# Patient Record
Sex: Male | Born: 1955 | Race: Black or African American | Hispanic: No | Smoking: Current some day smoker
Health system: Southern US, Community
[De-identification: ages and names within clinical notes are randomized; demographics above are authoritative.]

## PROBLEM LIST (undated history)

## (undated) DIAGNOSIS — K6289 Other specified diseases of anus and rectum: Secondary | ICD-10-CM

## (undated) DIAGNOSIS — C801 Malignant (primary) neoplasm, unspecified: Secondary | ICD-10-CM

## (undated) DIAGNOSIS — R195 Other fecal abnormalities: Secondary | ICD-10-CM

## (undated) DIAGNOSIS — E559 Vitamin D deficiency, unspecified: Secondary | ICD-10-CM

## (undated) DIAGNOSIS — K529 Noninfective gastroenteritis and colitis, unspecified: Secondary | ICD-10-CM

## (undated) DIAGNOSIS — J45909 Unspecified asthma, uncomplicated: Secondary | ICD-10-CM

## (undated) DIAGNOSIS — M545 Low back pain, unspecified: Secondary | ICD-10-CM

## (undated) DIAGNOSIS — C7951 Secondary malignant neoplasm of bone: Secondary | ICD-10-CM

## (undated) DIAGNOSIS — G8929 Other chronic pain: Secondary | ICD-10-CM

## (undated) DIAGNOSIS — C61 Malignant neoplasm of prostate: Secondary | ICD-10-CM

## (undated) DIAGNOSIS — I10 Essential (primary) hypertension: Secondary | ICD-10-CM

## (undated) HISTORY — DX: Vitamin D deficiency, unspecified: E55.9

## (undated) HISTORY — DX: Malignant neoplasm of prostate: C61

## (undated) HISTORY — DX: Noninfective gastroenteritis and colitis, unspecified: K52.9

## (undated) HISTORY — DX: Other chronic pain: G89.29

## (undated) HISTORY — DX: Secondary malignant neoplasm of bone: C79.51

## (undated) HISTORY — DX: Low back pain, unspecified: M54.50

## (undated) HISTORY — DX: Other fecal abnormalities: R19.5

---
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
## 2017-05-10 ENCOUNTER — Other Ambulatory Visit: Payer: Self-pay | Admitting: Oncology

## 2017-05-10 ENCOUNTER — Encounter (HOSPITAL_COMMUNITY): Payer: Self-pay

## 2017-05-10 ENCOUNTER — Emergency Department (HOSPITAL_COMMUNITY): Payer: Self-pay

## 2017-05-10 ENCOUNTER — Inpatient Hospital Stay (HOSPITAL_COMMUNITY)
Admission: EM | Admit: 2017-05-10 | Discharge: 2017-05-12 | DRG: 825 | Disposition: A | Discharge status: Home / Self Care | Payer: Self-pay | Attending: Internal Medicine | Admitting: Internal Medicine

## 2017-05-10 ENCOUNTER — Other Ambulatory Visit: Payer: Self-pay

## 2017-05-10 DIAGNOSIS — F17210 Nicotine dependence, cigarettes, uncomplicated: Secondary | ICD-10-CM | POA: Diagnosis present

## 2017-05-10 DIAGNOSIS — R1900 Intra-abdominal and pelvic swelling, mass and lump, unspecified site: Secondary | ICD-10-CM | POA: Diagnosis present

## 2017-05-10 DIAGNOSIS — R5383 Other fatigue: Secondary | ICD-10-CM | POA: Diagnosis present

## 2017-05-10 DIAGNOSIS — K5900 Constipation, unspecified: Secondary | ICD-10-CM | POA: Diagnosis present

## 2017-05-10 DIAGNOSIS — R634 Abnormal weight loss: Secondary | ICD-10-CM | POA: Diagnosis present

## 2017-05-10 DIAGNOSIS — C772 Secondary and unspecified malignant neoplasm of intra-abdominal lymph nodes: Principal | ICD-10-CM | POA: Diagnosis present

## 2017-05-10 DIAGNOSIS — R630 Anorexia: Secondary | ICD-10-CM | POA: Diagnosis present

## 2017-05-10 DIAGNOSIS — R319 Hematuria, unspecified: Secondary | ICD-10-CM | POA: Diagnosis present

## 2017-05-10 DIAGNOSIS — R531 Weakness: Secondary | ICD-10-CM | POA: Diagnosis present

## 2017-05-10 DIAGNOSIS — C61 Malignant neoplasm of prostate: Secondary | ICD-10-CM | POA: Diagnosis present

## 2017-05-10 HISTORY — DX: Unspecified asthma, uncomplicated: J45.909

## 2017-05-10 LAB — URINALYSIS, ROUTINE W REFLEX MICROSCOPIC
Bilirubin Urine: NEGATIVE
Glucose, UA: NEGATIVE mg/dL
Ketones, ur: NEGATIVE mg/dL
Nitrite: NEGATIVE
PROTEIN: 100 mg/dL — AB
SPECIFIC GRAVITY, URINE: 1.029 (ref 1.005–1.030)
SQUAMOUS EPITHELIAL / LPF: NONE SEEN
pH: 5 (ref 5.0–8.0)

## 2017-05-10 LAB — COMPREHENSIVE METABOLIC PANEL
ALBUMIN: 3.4 g/dL — AB (ref 3.5–5.0)
ALT: 13 U/L — AB (ref 17–63)
AST: 19 U/L (ref 15–41)
Alkaline Phosphatase: 57 U/L (ref 38–126)
Anion gap: 10 (ref 5–15)
BUN: 14 mg/dL (ref 6–20)
CHLORIDE: 107 mmol/L (ref 101–111)
CO2: 20 mmol/L — AB (ref 22–32)
CREATININE: 0.85 mg/dL (ref 0.61–1.24)
Calcium: 9.1 mg/dL (ref 8.9–10.3)
GFR calc Af Amer: >60 mL/min (ref >60)
GFR calc non Af Amer: >60 mL/min (ref >60)
Glucose, Bld: 105 mg/dL — ABNORMAL HIGH (ref 65–99)
Potassium: 4.1 mmol/L (ref 3.5–5.1)
SODIUM: 137 mmol/L (ref 135–145)
Total Bilirubin: 1 mg/dL (ref 0.3–1.2)
Total Protein: 6.9 g/dL (ref 6.5–8.1)

## 2017-05-10 LAB — CBC
HCT: 41.9 % (ref 39.0–52.0)
Hemoglobin: 13.8 g/dL (ref 13.0–17.0)
MCH: 31.5 pg (ref 26.0–34.0)
MCHC: 32.9 g/dL (ref 30.0–36.0)
MCV: 95.7 fL (ref 78.0–100.0)
PLATELETS: 369 10*3/uL (ref 150–400)
RBC: 4.38 MIL/uL (ref 4.22–5.81)
RDW: 13 % (ref 11.5–15.5)
WBC: 5.8 10*3/uL (ref 4.0–10.5)

## 2017-05-10 LAB — LACTATE DEHYDROGENASE: LDH: 225 U/L — AB (ref 98–192)

## 2017-05-10 LAB — PSA: PROSTATIC SPECIFIC ANTIGEN: 293 ng/mL — AB (ref 0.00–4.00)

## 2017-05-10 LAB — LIPASE, BLOOD: LIPASE: 43 U/L (ref 11–51)

## 2017-05-10 MED ORDER — MORPHINE SULFATE (PF) 4 MG/ML IV SOLN
4.0000 mg | Freq: Once | INTRAVENOUS | Status: AC
  Administered 2017-05-10: 4 mg via INTRAVENOUS
  Filled 2017-05-10: qty 1

## 2017-05-10 MED ORDER — MORPHINE SULFATE (PF) 4 MG/ML IV SOLN: 2.0000 mg | INTRAVENOUS | Status: DC | PRN

## 2017-05-10 MED ORDER — IOPAMIDOL (ISOVUE-300) INJECTION 61%
INTRAVENOUS | Status: AC
  Administered 2017-05-10: 100 mL
  Filled 2017-05-10: qty 100

## 2017-05-10 MED ORDER — SODIUM CHLORIDE 0.9% FLUSH: 3.0000 mL | INTRAVENOUS | Status: DC | PRN

## 2017-05-10 MED ORDER — IOPAMIDOL (ISOVUE-300) INJECTION 61%: 100.0000 mL | Freq: Once | INTRAVENOUS | Status: DC | PRN

## 2017-05-10 MED ORDER — SODIUM CHLORIDE 0.9 % IV SOLN: 250.0000 mL | INTRAVENOUS | Status: DC | PRN

## 2017-05-10 MED ORDER — DOCUSATE SODIUM 100 MG PO CAPS
100.0000 mg | ORAL_CAPSULE | Freq: Two times a day (BID) | ORAL | Status: DC
  Administered 2017-05-10 – 2017-05-12 (×3): 100 mg via ORAL
  Filled 2017-05-10 (×4): qty 1

## 2017-05-10 MED ORDER — SODIUM CHLORIDE 0.9% FLUSH
3.0000 mL | Freq: Two times a day (BID) | INTRAVENOUS | Status: DC
  Administered 2017-05-10 – 2017-05-11 (×3): 3 mL via INTRAVENOUS

## 2017-05-10 MED ORDER — SODIUM CHLORIDE 0.9 % IV BOLUS
1000.0000 mL | Freq: Once | INTRAVENOUS | Status: AC
  Administered 2017-05-10: 1000 mL via INTRAVENOUS

## 2017-05-10 NOTE — ED Notes (Signed)
Gave patient a UA cup. Patient states that he can not urinate at all.

## 2017-05-10 NOTE — ED Triage Notes (Addendum)
Pt states is has been 3 weeks since he had a "normal sized BM, just been having little spots". C/O poor PO intake, and "lost about 30 pounds in the last 3 weeks" Pt also reports "about 3 days ago I was having blood in my urine with some clots coming out". Pt has been "eating BC's like candy" for pain. Audible bowl sounds.

## 2017-05-10 NOTE — H&P (Signed)
History and Physical    Anthony Villanueva OEU:[id number] DOB: [birth year] DOA: 05/10/2017  PCP: Tresa Garter, MD  Patient coming from: Home  Chief Complaint: Constipation  HPI: Anthony Villanueva is a 62 y.o. male with no past medical history comes in with complaints of constipation for over 3 weeks.  Patient reports he is lost over 30 pounds in the last several months.  He has been extremely fatigued and to the point where he had to quit his job yesterday.  He has had blood in his urine.  Denies fevers.  Denies any nausea or vomiting or diarrhea.  Patient had a CT scan today which showed a very large pelvic mass likely an underlying prostate malignancy.  Patient has no health insurance and no cardiac care physician so was referred for admission for further workup with a biopsy in the morning by IR.  Review of Systems: As per HPI otherwise 10 point review of systems negative.   History reviewed. No pertinent past medical history.  None  History reviewed. No pertinent surgical history.  None   reports that he has been smoking cigarettes.  He has never used smokeless tobacco. He reports that he drinks about 7.2 oz of alcohol per week. He reports that he has current or past drug history. Drug: Marijuana.  No Known Allergies  No family history on file.  No prostate cancer  Prior to Admission medications   Medication Sig Start Date End Date Taking? Authorizing Provider  acetaminophen (TYLENOL) 500 MG tablet Take 1,000 mg by mouth every 6 (six) hours as needed for mild pain.   Yes [provider]  Aspirin-Salicylamide-Caffeine (ARTHRITIS STRENGTH BC POWDER PO) Take 1 Package by mouth as needed (hip pain).   Yes [provider]    Physical Exam: Vitals:   05/10/17 1140 05/10/17 1145 05/10/17 1348  BP: 140/85  (!) 139/96  Pulse: (!) 119  100  Resp: 16  18  Temp: 98.6 F (37 C)    TempSrc: Oral    SpO2: 100%  99%  Weight:  77.1 kg (170 lb)   Height:  6' (1.829 m)        Constitutional: NAD, calm, comfortable Vitals:   05/10/17 1140 05/10/17 1145 05/10/17 1348  BP: 140/85  (!) 139/96  Pulse: (!) 119  100  Resp: 16  18  Temp: 98.6 F (37 C)    TempSrc: Oral    SpO2: 100%  99%  Weight:  77.1 kg (170 lb)   Height:  6' (1.829 m)    Eyes: PERRL, lids and conjunctivae normal ENMT: Mucous membranes are moist. Posterior pharynx clear of any exudate or lesions.Normal dentition.  Neck: normal, supple, no masses, no thyromegaly Respiratory: clear to auscultation bilaterally, no wheezing, no crackles. Normal respiratory effort. No accessory muscle use.  Cardiovascular: Regular rate and rhythm, no murmurs / rubs / gallops. No extremity edema. 2+ pedal pulses. No carotid bruits.  Abdomen: no tenderness, no masses palpated. No hepatosplenomegaly. Bowel sounds positive.  Musculoskeletal: no clubbing / cyanosis. No joint deformity upper and lower extremities. Good ROM, no contractures. Normal muscle tone.  Skin: no rashes, lesions, ulcers. No induration Neurologic: CN 2-12 grossly intact. Sensation intact, DTR normal. Strength 5/5 in all 4.  Psychiatric: Normal judgment and insight. Alert and oriented x 3. Normal mood.    Labs on Admission: I have personally reviewed following labs and imaging studies  CBC: Recent Labs  Lab 05/10/17 1154  WBC 5.8  HGB 13.8  HCT 41.9  MCV 95.7  PLT 818   Basic Metabolic Panel: Recent Labs  Lab 05/10/17 1154  NA 137  K 4.1  CL 107  CO2 20*  GLUCOSE 105*  BUN 14  CREATININE 0.85  CALCIUM 9.1   GFR: Estimated Creatinine Clearance: 102 mL/min (by C-G formula based on SCr of 0.85 mg/dL). Liver Function Tests: Recent Labs  Lab 05/10/17 1154  AST 19  ALT 13*  ALKPHOS 57  BILITOT 1.0  PROT 6.9  ALBUMIN 3.4*   Recent Labs  Lab 05/10/17 1154  LIPASE 43   No results for input(s): AMMONIA in the last 168 hours. Coagulation Profile: No results for input(s): INR, PROTIME in the last 168 hours. Cardiac  Enzymes: No results for input(s): CKTOTAL, CKMB, CKMBINDEX, TROPONINI in the last 168 hours. BNP (last 3 results) No results for input(s): PROBNP in the last 8760 hours. HbA1C: No results for input(s): HGBA1C in the last 72 hours. CBG: No results for input(s): GLUCAP in the last 168 hours. Lipid Profile: No results for input(s): CHOL, HDL, LDLCALC, TRIG, CHOLHDL, LDLDIRECT in the last 72 hours. Thyroid Function Tests: No results for input(s): TSH, T4TOTAL, FREET4, T3FREE, THYROIDAB in the last 72 hours. Anemia Panel: No results for input(s): VITAMINB12, FOLATE, FERRITIN, TIBC, IRON, RETICCTPCT in the last 72 hours. Urine analysis:    Component Value Date/Time   COLORURINE AMBER (A) 05/10/2017 1148   APPEARANCEUR CLOUDY (A) 05/10/2017 1148   LABSPEC 1.029 05/10/2017 1148   PHURINE 5.0 05/10/2017 1148   GLUCOSEU NEGATIVE 05/10/2017 1148   HGBUR LARGE (A) 05/10/2017 1148   BILIRUBINUR NEGATIVE 05/10/2017 1148   KETONESUR NEGATIVE 05/10/2017 1148   PROTEINUR 100 (A) 05/10/2017 1148   NITRITE NEGATIVE 05/10/2017 1148   LEUKOCYTESUR MODERATE (A) 05/10/2017 1148   Sepsis Labs: !!!!!!!!!!!!!!!!!!!!!!!!!!!!!!!!!!!!!!!!!!!! @LABRCNTIP (procalcitonin:4,lacticidven:4) )No results found for this or any previous visit (from the past 240 hour(s)).   Radiological Exams on Admission: Ct Abdomen Pelvis W Contrast  Result Date: 05/10/2017 CLINICAL DATA:  62 year old male with a history of weight loss EXAM: CT ABDOMEN AND PELVIS WITH CONTRAST TECHNIQUE: Multidetector CT imaging of the abdomen and pelvis was performed using the standard protocol following bolus administration of intravenous contrast. CONTRAST:  122mL ISOVUE-300 IOPAMIDOL (ISOVUE-300) INJECTION 61% COMPARISON:  None. FINDINGS: Lower chest: No acute Hepatobiliary: Unremarkable appearance of liver parenchyma. Unremarkable gallbladder Pancreas: Unremarkable pancreas Spleen: Unremarkable spleen Adrenals/Urinary Tract: Unremarkable adrenal  glands. Right kidney with no hydronephrosis or nephrolithiasis. Unremarkable course of the visualized ureter. There is a fluid density cystic structure at the superior right kidney measuring 3.2 cm. Left kidney without hydronephrosis or nephrolithiasis. Unremarkable course of the visualized left ureter. Urinary bladder is uplifted by the pelvic mass which is inseparable from the bladder base. No inflammatory changes of the urinary bladder. Stomach/Bowel: Unremarkable appearance of the stomach. Unremarkable small bowel with no abnormal distention. No transition point. Large stool burden without evidence of transition point. Normal appendix. Mild diverticular change of the sigmoid colon. The rectum is displaced posteriorly by pelvic mass. Vascular/Lymphatic: Heterogeneously enhancing pelvic mass which is centered between urinary bladder and rectum, with the largest diameter on axial images on image 71 measuring 6.6 cm x 9.6 cm. There is no differentiation of prostate gland. The mass demonstrates a cranial caudal dimension on the sagittal images of approximately 10.5 cm. Mass effect displaces the rectum posteriorly and up lives the urinary bladder. Mass is inseparable from the urinary bladder base. Associated pelvic lymphadenopathy of the pelvic sidewall with multiple pathologic lymph nodes of  the pelvis and iliac stations. There is large lymph node mass of the periaortic/preaortic nodal stations at the level of the kidneys, with circumferential involvement of the aorta. The nodal mass surrounds bilateral renal arteries and bilateral renal veins. Multiple lymph nodes of the upper abdominal nodal stations at the level of the celiac artery. Mild atherosclerotic changes. No abdominal aortic aneurysm. No dissection. Bilateral iliac arteries and the proximal profunda femoris and common femoral artery bilaterally are patent. The suprarenal IVC is patent. Below the renal vein inflow, IVC is not appreciated given the extensive  lymphadenopathy. Reproductive: Prostate is not separable from the pelvic mass. Other: Small fat containing umbilical hernia. Musculoskeletal: No acute displaced fracture. Sclerotic focus at the anterior T12 and L2 vertebral body. No bony canal narrowing. Degenerative changes of the spine. Mild degenerative changes of the bilateral hips. There are small sclerotic foci within the bilateral iliac bones. IMPRESSION: Pelvic mass measuring 10 cm centered in the rectovesical space, inseparable from the adjacent anatomy. The etiology is favored to be prostate carcinoma, however, colon/rectal cancer or lymphoma are also considered. Referral for oncologic evaluation recommended, as well as correlation with lab values. Pelvic and retroperitoneal lymphadenopathy, including extensive periaortic nodal mass at the level of the kidneys surrounding the vasculature. No evidence of bowel obstruction.  Moderate stool burden. Diverticular change without evidence of acute diverticulitis. Small sclerotic focus of the anterior T12 and L2 vertebral bodies as well as small sclerotic foci the bilateral iliac bones. These are nonspecific, though could potentially represent bony metastases in the setting of prostate carcinoma. These results were called by telephone at the time of interpretation on 05/10/2017 at 5:34 pm to Dr. Jeannett Senior , who verbally acknowledged these results. Electronically Signed   By: Corrie Mckusick D.O.   On: 05/10/2017 17:34    Old chart reviewed Case discussed with EDP  Assessment/Plan 62 year old male with weight loss, constipation, hematuria comes in found to have large pelvic mass concerning for malignancy Principal Problem:   Pelvic mass-Keep n.p.o. for IR guided biopsy of mass in the morning.  Will need to get social work and case management involved due to his lack of health insurance.  Dr. Griffith Citron with oncology is arranging for him to get follow-up in their office as an outpatient.  PSA is pending.   Need to get biopsy formal results back to formulate further plan.  Active Problems:   Constipation-abdominal exam is benign patient is not distended or vomiting.   Weight loss, unintentional-likely due to above   Hematuria-noted    DVT prophylaxis: SCDs Code Status: Full Family Communication: None Disposition Plan: Per day team Consults called: Oncology, urology, IR Admission status: Observation   Tahnee Cifuentes A MD [facility]  If 7PM-7AM, please contact night-coverage [email or web address] Password [facility]  05/10/2017, 7:29 PM

## 2017-05-10 NOTE — ED Provider Notes (Signed)
[facility] EMERGENCY DEPARTMENT Provider Note   CSN: [id number] Arrival date & time: 05/10/17  1134     History   Chief Complaint Chief Complaint  Patient presents with  . Constipation  . Hematuria    HPI Anthony Villanueva is a 62 y.o. male.  =    Anthony Villanueva is a 62 y.o. male with no reported medical problems, presents to emergency department complaining of loss of appetite, generalized weakness, weight loss.  Patient states that over the last 3 weeks he has had no appetite.  He also reports that he has not had any stool over the last 3 weeks.  He states when he urinates he has noticed some blood.  Denies any dysuria, no frequency or urgency.  Denies any nausea or vomiting.   states that he lost approximately 30 pounds in the last 3 weeks.  "I just do not have appetite, I have to force myself to eat something."  He reports progressive generalized weakness, and states it is so bad that he could not go to work today.  He is a smoker.  He states he has been smoking marijuana as well to help with his appetite as well as drinking alcohol which is not helping.  History reviewed. No pertinent past medical history.  There are no active problems to display for this patient.   History reviewed. No pertinent surgical history.      Home Medications    Prior to Admission medications   Not on File    Family History No family history on file.  Social History Social History   Tobacco Use  . Smoking status: Current Every Day Smoker    Types: Cigarettes  . Smokeless tobacco: Never Used  . Tobacco comment: 3-4 per day   Substance Use Topics  . Alcohol use: Yes    Alcohol/week: 7.2 oz    Types: 12 Cans of beer per week  . Drug use: Yes    Types: Marijuana     Allergies   Patient has no known allergies.   Review of Systems Review of Systems  Constitutional: Positive for appetite change, fatigue and unexpected weight change. Negative for chills and  fever.  Respiratory: Negative for cough, chest tightness and shortness of breath.   Cardiovascular: Negative for chest pain, palpitations and leg swelling.  Gastrointestinal: Negative for abdominal distention, abdominal pain, diarrhea, nausea and vomiting.  Genitourinary: Positive for hematuria. Negative for dysuria, flank pain, frequency and urgency.  Musculoskeletal: Negative for arthralgias, myalgias, neck pain and neck stiffness.  Skin: Negative for rash.  Allergic/Immunologic: Negative for immunocompromised state.  Neurological: Positive for weakness. Negative for dizziness, light-headedness, numbness and headaches.  All other systems reviewed and are negative.    Physical Exam Updated Vital Signs BP (!) 139/96 (BP Location: Right Arm)   Pulse 100   Temp 98.6 F (37 C) (Oral)   Resp 18   Ht 6' (1.829 m)   Wt 77.1 kg (170 lb)   SpO2 99%   BMI 23.06 kg/m   Physical Exam  Constitutional: He is oriented to person, place, and time. He appears well-developed and well-nourished. No distress.  HENT:  Head: Normocephalic and atraumatic.  Eyes: Conjunctivae are normal.  Neck: Neck supple.  Cardiovascular: Normal rate, regular rhythm and normal heart sounds.  Pulmonary/Chest: Effort normal. No respiratory distress. He has no wheezes. He has no rales.  Abdominal: Soft. Bowel sounds are normal. He exhibits no distension. There is no tenderness. There is no  rebound and no guarding.  Musculoskeletal: He exhibits no edema.  Neurological: He is alert and oriented to person, place, and time.  Skin: Skin is warm and dry.  Nursing note and vitals reviewed.    ED Treatments / Results  Labs (all labs ordered are listed, but only abnormal results are displayed) Labs Reviewed  COMPREHENSIVE METABOLIC PANEL - Abnormal; Notable for the following components:      Result Value   CO2 20 (*)    Glucose, Bld 105 (*)    Albumin 3.4 (*)    ALT 13 (*)    All other components within normal  limits  URINALYSIS, ROUTINE W REFLEX MICROSCOPIC - Abnormal; Notable for the following components:   Color, Urine AMBER (*)    APPearance CLOUDY (*)    Hgb urine dipstick LARGE (*)    Protein, ur 100 (*)    Leukocytes, UA MODERATE (*)    Bacteria, UA RARE (*)    All other components within normal limits  LIPASE, BLOOD  CBC    EKG None  Radiology Ct Abdomen Pelvis W Contrast  Result Date: 05/10/2017 CLINICAL DATA:  62 year old male with a history of weight loss EXAM: CT ABDOMEN AND PELVIS WITH CONTRAST TECHNIQUE: Multidetector CT imaging of the abdomen and pelvis was performed using the standard protocol following bolus administration of intravenous contrast. CONTRAST:  113mL ISOVUE-300 IOPAMIDOL (ISOVUE-300) INJECTION 61% COMPARISON:  None. FINDINGS: Lower chest: No acute Hepatobiliary: Unremarkable appearance of liver parenchyma. Unremarkable gallbladder Pancreas: Unremarkable pancreas Spleen: Unremarkable spleen Adrenals/Urinary Tract: Unremarkable adrenal glands. Right kidney with no hydronephrosis or nephrolithiasis. Unremarkable course of the visualized ureter. There is a fluid density cystic structure at the superior right kidney measuring 3.2 cm. Left kidney without hydronephrosis or nephrolithiasis. Unremarkable course of the visualized left ureter. Urinary bladder is uplifted by the pelvic mass which is inseparable from the bladder base. No inflammatory changes of the urinary bladder. Stomach/Bowel: Unremarkable appearance of the stomach. Unremarkable small bowel with no abnormal distention. No transition point. Large stool burden without evidence of transition point. Normal appendix. Mild diverticular change of the sigmoid colon. The rectum is displaced posteriorly by pelvic mass. Vascular/Lymphatic: Heterogeneously enhancing pelvic mass which is centered between urinary bladder and rectum, with the largest diameter on axial images on image 71 measuring 6.6 cm x 9.6 cm. There is no  differentiation of prostate gland. The mass demonstrates a cranial caudal dimension on the sagittal images of approximately 10.5 cm. Mass effect displaces the rectum posteriorly and up lives the urinary bladder. Mass is inseparable from the urinary bladder base. Associated pelvic lymphadenopathy of the pelvic sidewall with multiple pathologic lymph nodes of the pelvis and iliac stations. There is large lymph node mass of the periaortic/preaortic nodal stations at the level of the kidneys, with circumferential involvement of the aorta. The nodal mass surrounds bilateral renal arteries and bilateral renal veins. Multiple lymph nodes of the upper abdominal nodal stations at the level of the celiac artery. Mild atherosclerotic changes. No abdominal aortic aneurysm. No dissection. Bilateral iliac arteries and the proximal profunda femoris and common femoral artery bilaterally are patent. The suprarenal IVC is patent. Below the renal vein inflow, IVC is not appreciated given the extensive lymphadenopathy. Reproductive: Prostate is not separable from the pelvic mass. Other: Small fat containing umbilical hernia. Musculoskeletal: No acute displaced fracture. Sclerotic focus at the anterior T12 and L2 vertebral body. No bony canal narrowing. Degenerative changes of the spine. Mild degenerative changes of the bilateral hips. There  are small sclerotic foci within the bilateral iliac bones. IMPRESSION: Pelvic mass measuring 10 cm centered in the rectovesical space, inseparable from the adjacent anatomy. The etiology is favored to be prostate carcinoma, however, colon/rectal cancer or lymphoma are also considered. Referral for oncologic evaluation recommended, as well as correlation with lab values. Pelvic and retroperitoneal lymphadenopathy, including extensive periaortic nodal mass at the level of the kidneys surrounding the vasculature. No evidence of bowel obstruction.  Moderate stool burden. Diverticular change without  evidence of acute diverticulitis. Small sclerotic focus of the anterior T12 and L2 vertebral bodies as well as small sclerotic foci the bilateral iliac bones. These are nonspecific, though could potentially represent bony metastases in the setting of prostate carcinoma. These results were called by telephone at the time of interpretation on 05/10/2017 at 5:34 pm to Dr. Jeannett Senior , who verbally acknowledged these results. Electronically Signed   By: Corrie Mckusick D.O.   On: 05/10/2017 17:34    Procedures Procedures (including critical care time)  Medications Ordered in ED Medications - No data to display   Initial Impression / Assessment and Plan / ED Course  I have reviewed the triage vital signs and the nursing notes.  Pertinent labs & imaging results that were available during my care of the patient were reviewed by me and considered in my medical decision making (see chart for details).     Patient with unintentional weight loss over the last 3 weeks, reports losing 30 pounds.  Loss of appetite.  States he thinks he might be constipated, has not had a bowel movement in 3 weeks.  Abdomen is soft, nontender.  Patient is a heavy smoker.  Will get CT abdomen pelvis to rule out mass and possible small bowel obstruction. Labs unremarkable. Pt is tachycardic, will hydrate, most likely dehydrated from poor PO intake. UA shows TNTC RBC, rare bacteria, moderate leukocytes. Will send culture, question UTI   6:27 PM CT scan showing large mass that is compressing the rectum, which would explain why patient is having difficulty having bowel movements.  Diffuse lymphadenopathy, possibly lytic lesions in the spine.  I discussed patient with several people.  I spoke with Dr. Jana Hakim with oncology, who recommended adding CEA, PSA, LDH, beta-2 microglobulin to his blood work.  These labs were added.  He will follow-up on patient, but recommended contacting urology.  I spoke with Dr. Louis Meckel with  urology, you would also be willing to follow-up on patient, however stated that interventional radiology would be a better choice to biopsy this mass.  I spoke with Dr. Pascal Lux, who advised that  patient most likely will not be able to have this biopsy done as an outpatient for at least another week.  He advised that since patient has no PCP, would be difficult to follow-up on his results.  Discussed with patient, patient is worried that he is unable to have a bowel movement and we have not addressed this issue here yet.  I would call medicine team to see if they would be willing to admit patient for workup while in the hospital  and to improve his constipation.  7:18 PM Spoke with Dr. Shanon Brow with medicine, asked to verify if pt would be able to get biopsy as an inpatient. I spoke with Dr. Pascal Lux again with IR who is not sure of schedule for tomorrow and not sure if he would definitely do it tomorrow. He explained that they try, but cannot give def answer at this time. This  was related to dr. Shanon Brow.   Vitals:   05/10/17 1145 05/10/17 1348 05/10/17 2032 05/10/17 2108  BP:  (!) 139/96 (!) 150/95 (!) 159/86  Pulse:  100 98 97  Resp:  18  18  Temp:    98.4 F (36.9 C)  TempSrc:    Oral  SpO2:  99% 100% 100%  Weight: 77.1 kg (170 lb)   77.1 kg (170 lb)  Height: 6' (1.829 m)   6' (1.829 m)    Final Clinical Impressions(s) / ED Diagnoses   Final diagnoses:  Pelvic mass    ED Discharge Orders    None       Jeannett Senior, PA-C 05/11/17 0042    Quintella Reichert, MD 05/11/17 [phone]

## 2017-05-11 ENCOUNTER — Encounter (HOSPITAL_COMMUNITY): Payer: Self-pay | Admitting: General Practice

## 2017-05-11 ENCOUNTER — Encounter: Payer: Self-pay | Admitting: Oncology

## 2017-05-11 ENCOUNTER — Inpatient Hospital Stay (HOSPITAL_COMMUNITY): Payer: Self-pay

## 2017-05-11 DIAGNOSIS — R972 Elevated prostate specific antigen [PSA]: Secondary | ICD-10-CM

## 2017-05-11 HISTORY — PX: PROSTATE BIOPSY: SHX241

## 2017-05-11 LAB — BASIC METABOLIC PANEL
Anion gap: 12 (ref 5–15)
BUN: 9 mg/dL (ref 6–20)
CHLORIDE: 105 mmol/L (ref 101–111)
CO2: 20 mmol/L — ABNORMAL LOW (ref 22–32)
Calcium: 8.6 mg/dL — ABNORMAL LOW (ref 8.9–10.3)
Creatinine, Ser: 0.82 mg/dL (ref 0.61–1.24)
GFR calc Af Amer: >60 mL/min (ref >60)
GFR calc non Af Amer: >60 mL/min (ref >60)
GLUCOSE: 86 mg/dL (ref 65–99)
Potassium: 3.5 mmol/L (ref 3.5–5.1)
SODIUM: 137 mmol/L (ref 135–145)

## 2017-05-11 LAB — CEA: CEA1: 1.5 ng/mL (ref 0.0–4.7)

## 2017-05-11 LAB — HIV ANTIBODY (ROUTINE TESTING W REFLEX): HIV Screen 4th Generation wRfx: NONREACTIVE

## 2017-05-11 LAB — CBC
HCT: 36.6 % — ABNORMAL LOW (ref 39.0–52.0)
Hemoglobin: 12.1 g/dL — ABNORMAL LOW (ref 13.0–17.0)
MCH: 31.8 pg (ref 26.0–34.0)
MCHC: 33.1 g/dL (ref 30.0–36.0)
MCV: 96.3 fL (ref 78.0–100.0)
Platelets: 323 10*3/uL (ref 150–400)
RBC: 3.8 MIL/uL — ABNORMAL LOW (ref 4.22–5.81)
RDW: 13.4 % (ref 11.5–15.5)
WBC: 4.6 10*3/uL (ref 4.0–10.5)

## 2017-05-11 LAB — URINE CULTURE

## 2017-05-11 LAB — PROTIME-INR
INR: 1.06
Prothrombin Time: 13.7 seconds (ref 11.4–15.2)

## 2017-05-11 LAB — BETA 2 MICROGLOBULIN, SERUM: Beta-2 Microglobulin: 1.9 mg/L (ref 0.6–2.4)

## 2017-05-11 MED ORDER — SENNA 8.6 MG PO TABS
1.0000 | ORAL_TABLET | Freq: Every day | ORAL | Status: DC | PRN
  Administered 2017-05-11: 8.6 mg via ORAL
  Filled 2017-05-11: qty 1

## 2017-05-11 MED ORDER — MIDAZOLAM HCL 2 MG/2ML IJ SOLN
INTRAMUSCULAR | Status: AC
  Filled 2017-05-11: qty 4

## 2017-05-11 MED ORDER — FENTANYL CITRATE (PF) 100 MCG/2ML IJ SOLN
INTRAMUSCULAR | Status: AC
  Filled 2017-05-11: qty 4

## 2017-05-11 MED ORDER — POLYETHYLENE GLYCOL 3350 17 G PO PACK
17.0000 g | PACK | Freq: Every day | ORAL | Status: DC
  Administered 2017-05-11 – 2017-05-12 (×2): 17 g via ORAL
  Filled 2017-05-11 (×2): qty 1

## 2017-05-11 MED ORDER — MIDAZOLAM HCL 2 MG/2ML IJ SOLN
INTRAMUSCULAR | Status: AC | PRN
  Administered 2017-05-11: 1 mg via INTRAVENOUS

## 2017-05-11 MED ORDER — FENTANYL CITRATE (PF) 100 MCG/2ML IJ SOLN
INTRAMUSCULAR | Status: AC | PRN
  Administered 2017-05-11: 50 ug via INTRAVENOUS

## 2017-05-11 MED ORDER — SODIUM CHLORIDE 0.9 % IV SOLN
INTRAVENOUS | Status: AC | PRN
  Administered 2017-05-11: 10 mL/h via INTRAVENOUS

## 2017-05-11 MED ORDER — LIDOCAINE HCL 1 % IJ SOLN
INTRAMUSCULAR | Status: AC
  Filled 2017-05-11: qty 20

## 2017-05-11 NOTE — Progress Notes (Signed)
PROGRESS NOTE    Anthony Villanueva  KZS:[id number] DOB: 01/29/1956 DOA: 05/10/2017 PCP: No primary care provider on file.   Outpatient Specialists:     Brief Narrative:  Anthony Villanueva is a 62 y.o. male with no past medical history comes in with complaints of constipation for over 3 weeks.  Patient reports he has lost over 30 pounds in the last several weeks.  He has been extremely fatigued and to the point where he had to quit his job yesterday.   Patient had a CT scan today which showed a very large pelvic mass likely an underlying prostate malignancy.  PSA>200.       Assessment & Plan:   Principal Problem:   Pelvic mass Active Problems:   Constipation   Weight loss, unintentional   Hematuria     Pelvic mass- was n.p.o. for IR guided biopsy of mass but girlfriend brought him a sandwich this AM.  -biopsy in AM -PSA > 200 -once path back, will have follow up with Dr. Osker Mason    Constipation-abdominal exam is benign patient is not distended or vomiting.    Weight loss, unintentional-likely due to prostate cancer        DVT prophylaxis:  SCD's  Code Status: Full Code   Family Communication:   Disposition Plan:     Consultants:   Oncology on phone (to arrange outpatient follow up)  IR   Subjective: hungry  Objective: Vitals:   05/10/17 2032 05/10/17 2108 05/11/17 0436 05/11/17 0500  BP: (!) 150/95 (!) 159/86 (!) 141/88   Pulse: 98 97 90   Resp:  18 18   Temp:  98.4 F (36.9 C) 98.2 F (36.8 C)   TempSrc:  Oral Oral   SpO2: 100% 100% 100%   Weight:  77.1 kg (170 lb)  77.8 kg (171 lb 8.3 oz)  Height:  6' (1.829 m)      Intake/Output Summary (Last 24 hours) at 05/11/2017 1338 Last data filed at 05/11/2017 1257 Gross per 24 hour  Intake 6696.67 ml  Output 300 ml  Net 6396.67 ml   Filed Weights   05/10/17 1145 05/10/17 2108 05/11/17 0500  Weight: 77.1 kg (170 lb) 77.1 kg (170 lb) 77.8 kg (171 lb 8.3 oz)    Examination:  General exam:  Appears calm and comfortable  Respiratory system: Clear to auscultation. Respiratory effort normal. Cardiovascular system: S1 & S2 heard, RRR. No JVD, murmurs, rubs, gallops or clicks. No pedal edema. Gastrointestinal system: Abdomen is nondistended, soft and nontender. No organomegaly or masses felt. Normal bowel sounds heard. Central nervous system: Alert and oriented. No focal neurological deficits. Extremities: Symmetric 5 x 5 power. Skin: No rashes, lesions or ulcers Psychiatry: Judgement and insight appear normal. Mood & affect appropriate.     Data Reviewed: I have personally reviewed following labs and imaging studies  CBC: Recent Labs  Lab 05/10/17 1154 05/11/17 0434  WBC 5.8 4.6  HGB 13.8 12.1*  HCT 41.9 36.6*  MCV 95.7 96.3  PLT 369 732   Basic Metabolic Panel: Recent Labs  Lab 05/10/17 1154 05/11/17 0434  NA 137 137  K 4.1 3.5  CL 107 105  CO2 20* 20*  GLUCOSE 105* 86  BUN 14 9  CREATININE 0.85 0.82  CALCIUM 9.1 8.6*   GFR: Estimated Creatinine Clearance: 106.5 mL/min (by C-G formula based on SCr of 0.82 mg/dL). Liver Function Tests: Recent Labs  Lab 05/10/17 1154  AST 19  ALT 13*  ALKPHOS 57  BILITOT 1.0  PROT 6.9  ALBUMIN 3.4*   Recent Labs  Lab 05/10/17 1154  LIPASE 43   No results for input(s): AMMONIA in the last 168 hours. Coagulation Profile: Recent Labs  Lab 05/11/17 0434  INR 1.06   Cardiac Enzymes: No results for input(s): CKTOTAL, CKMB, CKMBINDEX, TROPONINI in the last 168 hours. BNP (last 3 results) No results for input(s): PROBNP in the last 8760 hours. HbA1C: No results for input(s): HGBA1C in the last 72 hours. CBG: No results for input(s): GLUCAP in the last 168 hours. Lipid Profile: No results for input(s): CHOL, HDL, LDLCALC, TRIG, CHOLHDL, LDLDIRECT in the last 72 hours. Thyroid Function Tests: No results for input(s): TSH, T4TOTAL, FREET4, T3FREE, THYROIDAB in the last 72 hours. Anemia Panel: No results for  input(s): VITAMINB12, FOLATE, FERRITIN, TIBC, IRON, RETICCTPCT in the last 72 hours. Urine analysis:    Component Value Date/Time   COLORURINE AMBER (A) 05/10/2017 1148   APPEARANCEUR CLOUDY (A) 05/10/2017 1148   LABSPEC 1.029 05/10/2017 1148   PHURINE 5.0 05/10/2017 1148   GLUCOSEU NEGATIVE 05/10/2017 1148   HGBUR LARGE (A) 05/10/2017 1148   BILIRUBINUR NEGATIVE 05/10/2017 1148   KETONESUR NEGATIVE 05/10/2017 1148   PROTEINUR 100 (A) 05/10/2017 1148   NITRITE NEGATIVE 05/10/2017 1148   LEUKOCYTESUR MODERATE (A) 05/10/2017 1148     )No results found for this or any previous visit (from the past 240 hour(s)).    Anti-infectives (From admission, onward)   None       Radiology Studies: Ct Abdomen Pelvis W Contrast  Result Date: 05/10/2017 CLINICAL DATA:  62 year old male with a history of weight loss EXAM: CT ABDOMEN AND PELVIS WITH CONTRAST TECHNIQUE: Multidetector CT imaging of the abdomen and pelvis was performed using the standard protocol following bolus administration of intravenous contrast. CONTRAST:  153mL ISOVUE-300 IOPAMIDOL (ISOVUE-300) INJECTION 61% COMPARISON:  None. FINDINGS: Lower chest: No acute Hepatobiliary: Unremarkable appearance of liver parenchyma. Unremarkable gallbladder Pancreas: Unremarkable pancreas Spleen: Unremarkable spleen Adrenals/Urinary Tract: Unremarkable adrenal glands. Right kidney with no hydronephrosis or nephrolithiasis. Unremarkable course of the visualized ureter. There is a fluid density cystic structure at the superior right kidney measuring 3.2 cm. Left kidney without hydronephrosis or nephrolithiasis. Unremarkable course of the visualized left ureter. Urinary bladder is uplifted by the pelvic mass which is inseparable from the bladder base. No inflammatory changes of the urinary bladder. Stomach/Bowel: Unremarkable appearance of the stomach. Unremarkable small bowel with no abnormal distention. No transition point. Large stool burden without  evidence of transition point. Normal appendix. Mild diverticular change of the sigmoid colon. The rectum is displaced posteriorly by pelvic mass. Vascular/Lymphatic: Heterogeneously enhancing pelvic mass which is centered between urinary bladder and rectum, with the largest diameter on axial images on image 71 measuring 6.6 cm x 9.6 cm. There is no differentiation of prostate gland. The mass demonstrates a cranial caudal dimension on the sagittal images of approximately 10.5 cm. Mass effect displaces the rectum posteriorly and up lives the urinary bladder. Mass is inseparable from the urinary bladder base. Associated pelvic lymphadenopathy of the pelvic sidewall with multiple pathologic lymph nodes of the pelvis and iliac stations. There is large lymph node mass of the periaortic/preaortic nodal stations at the level of the kidneys, with circumferential involvement of the aorta. The nodal mass surrounds bilateral renal arteries and bilateral renal veins. Multiple lymph nodes of the upper abdominal nodal stations at the level of the celiac artery. Mild atherosclerotic changes. No abdominal aortic aneurysm. No dissection. Bilateral iliac arteries and the  proximal profunda femoris and common femoral artery bilaterally are patent. The suprarenal IVC is patent. Below the renal vein inflow, IVC is not appreciated given the extensive lymphadenopathy. Reproductive: Prostate is not separable from the pelvic mass. Other: Small fat containing umbilical hernia. Musculoskeletal: No acute displaced fracture. Sclerotic focus at the anterior T12 and L2 vertebral body. No bony canal narrowing. Degenerative changes of the spine. Mild degenerative changes of the bilateral hips. There are small sclerotic foci within the bilateral iliac bones. IMPRESSION: Pelvic mass measuring 10 cm centered in the rectovesical space, inseparable from the adjacent anatomy. The etiology is favored to be prostate carcinoma, however, colon/rectal cancer or  lymphoma are also considered. Referral for oncologic evaluation recommended, as well as correlation with lab values. Pelvic and retroperitoneal lymphadenopathy, including extensive periaortic nodal mass at the level of the kidneys surrounding the vasculature. No evidence of bowel obstruction.  Moderate stool burden. Diverticular change without evidence of acute diverticulitis. Small sclerotic focus of the anterior T12 and L2 vertebral bodies as well as small sclerotic foci the bilateral iliac bones. These are nonspecific, though could potentially represent bony metastases in the setting of prostate carcinoma. These results were called by telephone at the time of interpretation on 05/10/2017 at 5:34 pm to Dr. Jeannett Senior , who verbally acknowledged these results. Electronically Signed   By: Corrie Mckusick D.O.   On: 05/10/2017 17:34        Scheduled Meds: . docusate sodium  100 mg Oral BID  . sodium chloride flush  3 mL Intravenous Q12H   Continuous Infusions: . sodium chloride       LOS: 0 days    Time spent: 35 min    Geradine Girt, DO [facility] Pager [phone]  If 7PM-7AM, please contact night-coverage [email or web address] Password [email or web address] 05/11/2017, 1:38 PM

## 2017-05-11 NOTE — Sedation Documentation (Signed)
Patient is resting comfortably. 

## 2017-05-11 NOTE — Progress Notes (Signed)
Received patient from ED via wheelchair, AOx4, ambulatory, VS stable with slightly elevated BP at 159/86 d/t left hip pain at 6/10.  Gave food to eat and sodas to drink.  Text paged [facility] floor coverage for PRN pain medication order, oriented to room, bed controls and call light.  Administered PRN pain medication morphine per order after patient had eaten dinner.  Patient now resting on bed comfortably with both eyes closed, NPO midnight for IR guided biopsy of mass.

## 2017-05-11 NOTE — H&P (Signed)
Chief Complaint: Patient was seen in consultation today for pelvic mass with lymphadenopathy  Referring Physician(s): Dr. Steward Ros  Supervising Physician: Arne Cleveland  Patient Status: [facility] - In-pt  History of Present Illness: Anthony Villanueva is a 62 y.o. male with no significant past medial history presented to [facility] ED with weight loss, constipation, and hematuria.   CT Abdomen Pelvis yesterday shows: Pelvic mass measuring 10 cm centered in the rectovesical space, inseparable from the adjacent anatomy. The etiology is favored to be prostate carcinoma, however, colon/rectal cancer or lymphoma are also considered. Referral for oncologic evaluation recommended, as well as correlation with lab values.  IR consulted for biopsy at the request of Dr. Shanon Brow.  Patient was made NPO overnight, however does tell me he had a breakfast sandwich this AM.  He is not currently on blood thinners.   History reviewed. No pertinent past medical history.  History reviewed. No pertinent surgical history.  Allergies: Patient has no known allergies.  Medications: Prior to Admission medications   Medication Sig Start Date End Date Taking? Authorizing Provider  acetaminophen (TYLENOL) 500 MG tablet Take 1,000 mg by mouth every 6 (six) hours as needed for mild pain.   Yes [provider]  Aspirin-Salicylamide-Caffeine (ARTHRITIS STRENGTH BC POWDER PO) Take 1 Package by mouth as needed (hip pain).   Yes [provider]     No family history on file.  Social History   Socioeconomic History  . Marital status: Single    Spouse name: Not on file  . Number of children: Not on file  . Years of education: Not on file  . Highest education level: Not on file  Occupational History  . Not on file  Social Needs  . Financial resource strain: Not on file  . Food insecurity:    Worry: Not on file    Inability: Not on file  . Transportation needs:    Medical: Not on file   Non-medical: Not on file  Tobacco Use  . Smoking status: Current Every Day Smoker    Types: Cigarettes  . Smokeless tobacco: Never Used  . Tobacco comment: 3-4 per day   Substance and Sexual Activity  . Alcohol use: Yes    Alcohol/week: 7.2 oz    Types: 12 Cans of beer per week  . Drug use: Yes    Types: Marijuana  . Sexual activity: Not on file  Lifestyle  . Physical activity:    Days per week: Not on file    Minutes per session: Not on file  . Stress: Not on file  Relationships  . Social connections:    Talks on phone: Not on file    Gets together: Not on file    Attends religious service: Not on file    Active member of club or organization: Not on file    Attends meetings of clubs or organizations: Not on file    Relationship status: Not on file  Other Topics Concern  . Not on file  Social History Narrative  . Not on file     Review of Systems: A 12 point ROS discussed and pertinent positives are indicated in the HPI above.  All other systems are negative.  Review of Systems  Constitutional: Positive for fatigue. Negative for fever.  Respiratory: Negative for cough and shortness of breath.   Cardiovascular: Negative for chest pain.  Gastrointestinal: Negative for abdominal pain.  Genitourinary: Positive for hematuria.  Psychiatric/Behavioral: Negative for behavioral problems and confusion.  Vital Signs: BP (!) 141/88 (BP Location: Right Arm)   Pulse 90   Temp 98.2 F (36.8 C) (Oral)   Resp 18   Ht 6' (1.829 m)   Wt 171 lb 8.3 oz (77.8 kg)   SpO2 100%   BMI 23.26 kg/m   Physical Exam  Constitutional: He is oriented to person, place, and time. He appears well-developed.  Cardiovascular: Normal rate, regular rhythm and normal heart sounds.  Pulmonary/Chest: Effort normal and breath sounds normal. No respiratory distress.  Abdominal: Soft.  Neurological: He is alert and oriented to person, place, and time.  Skin: Skin is warm and dry.  Psychiatric: He  has a normal mood and affect. His behavior is normal. Judgment and thought content normal.  Nursing note and vitals reviewed.    MD Evaluation Airway: WNL Heart: WNL Abdomen: WNL Chest/ Lungs: WNL ASA  Classification: 3 Mallampati/Airway Score: One   Imaging: Ct Abdomen Pelvis W Contrast  Result Date: 05/10/2017 CLINICAL DATA:  62 year old male with a history of weight loss EXAM: CT ABDOMEN AND PELVIS WITH CONTRAST TECHNIQUE: Multidetector CT imaging of the abdomen and pelvis was performed using the standard protocol following bolus administration of intravenous contrast. CONTRAST:  162mL ISOVUE-300 IOPAMIDOL (ISOVUE-300) INJECTION 61% COMPARISON:  None. FINDINGS: Lower chest: No acute Hepatobiliary: Unremarkable appearance of liver parenchyma. Unremarkable gallbladder Pancreas: Unremarkable pancreas Spleen: Unremarkable spleen Adrenals/Urinary Tract: Unremarkable adrenal glands. Right kidney with no hydronephrosis or nephrolithiasis. Unremarkable course of the visualized ureter. There is a fluid density cystic structure at the superior right kidney measuring 3.2 cm. Left kidney without hydronephrosis or nephrolithiasis. Unremarkable course of the visualized left ureter. Urinary bladder is uplifted by the pelvic mass which is inseparable from the bladder base. No inflammatory changes of the urinary bladder. Stomach/Bowel: Unremarkable appearance of the stomach. Unremarkable small bowel with no abnormal distention. No transition point. Large stool burden without evidence of transition point. Normal appendix. Mild diverticular change of the sigmoid colon. The rectum is displaced posteriorly by pelvic mass. Vascular/Lymphatic: Heterogeneously enhancing pelvic mass which is centered between urinary bladder and rectum, with the largest diameter on axial images on image 71 measuring 6.6 cm x 9.6 cm. There is no differentiation of prostate gland. The mass demonstrates a cranial caudal dimension on the  sagittal images of approximately 10.5 cm. Mass effect displaces the rectum posteriorly and up lives the urinary bladder. Mass is inseparable from the urinary bladder base. Associated pelvic lymphadenopathy of the pelvic sidewall with multiple pathologic lymph nodes of the pelvis and iliac stations. There is large lymph node mass of the periaortic/preaortic nodal stations at the level of the kidneys, with circumferential involvement of the aorta. The nodal mass surrounds bilateral renal arteries and bilateral renal veins. Multiple lymph nodes of the upper abdominal nodal stations at the level of the celiac artery. Mild atherosclerotic changes. No abdominal aortic aneurysm. No dissection. Bilateral iliac arteries and the proximal profunda femoris and common femoral artery bilaterally are patent. The suprarenal IVC is patent. Below the renal vein inflow, IVC is not appreciated given the extensive lymphadenopathy. Reproductive: Prostate is not separable from the pelvic mass. Other: Small fat containing umbilical hernia. Musculoskeletal: No acute displaced fracture. Sclerotic focus at the anterior T12 and L2 vertebral body. No bony canal narrowing. Degenerative changes of the spine. Mild degenerative changes of the bilateral hips. There are small sclerotic foci within the bilateral iliac bones. IMPRESSION: Pelvic mass measuring 10 cm centered in the rectovesical space, inseparable from the adjacent anatomy. The  etiology is favored to be prostate carcinoma, however, colon/rectal cancer or lymphoma are also considered. Referral for oncologic evaluation recommended, as well as correlation with lab values. Pelvic and retroperitoneal lymphadenopathy, including extensive periaortic nodal mass at the level of the kidneys surrounding the vasculature. No evidence of bowel obstruction.  Moderate stool burden. Diverticular change without evidence of acute diverticulitis. Small sclerotic focus of the anterior T12 and L2 vertebral  bodies as well as small sclerotic foci the bilateral iliac bones. These are nonspecific, though could potentially represent bony metastases in the setting of prostate carcinoma. These results were called by telephone at the time of interpretation on 05/10/2017 at 5:34 pm to Dr. Jeannett Senior , who verbally acknowledged these results. Electronically Signed   By: Corrie Mckusick D.O.   On: 05/10/2017 17:34    Labs:  CBC: Recent Labs    05/10/17 1154 05/11/17 0434  WBC 5.8 4.6  HGB 13.8 12.1*  HCT 41.9 36.6*  PLT 369 323    COAGS: Recent Labs    05/11/17 0434  INR 1.06    BMP: Recent Labs    05/10/17 1154 05/11/17 0434  NA 137 137  K 4.1 3.5  CL 107 105  CO2 20* 20*  GLUCOSE 105* 86  BUN 14 9  CALCIUM 9.1 8.6*  CREATININE 0.85 0.82  GFRNONAA >60 >60  GFRAA >60 >60    LIVER FUNCTION TESTS: Recent Labs    05/10/17 1154  BILITOT 1.0  AST 19  ALT 13*  ALKPHOS 57  PROT 6.9  ALBUMIN 3.4*    TUMOR MARKERS: No results for input(s): AFPTM, CEA, CA199, CHROMGRNA in the last 8760 hours.  Assessment and Plan: Pelvic mass with lymphadenopathy Patient presented [facility] ED yesterday with constipation, hematuria, and significant weight loss.  CT Abdomen/Pelvis shows large pelvic mass with regional lymphadenopathy.  IR consulted for biopsy at the request of Dr. Shanon Brow.  Dr. Vernard Gambles as reviewed case and approved patient for biopsy.  He was made NPO overnight, but ate a sandwich brought by his girlfriend this AM at 830.  Patient now aware he should be completely NPO and that we will notify him if case cannot proceed today.  INR 1.06.  Will hopefully be able to proceed this afternoon as schedule allows.  Risks and benefits discussed with the patient including, but not limited to bleeding, infection, damage to adjacent structures or low yield requiring additional tests.  All of the patient's questions were answered, patient is agreeable to proceed. Consent signed and in  chart.  Thank you for this interesting consult.  I greatly enjoyed meeting Anthony Villanueva and look forward to participating in their care.  A copy of this report was sent to the requesting provider on this date.  Electronically Signed: Docia Barrier, PA 05/11/2017, 11:14 AM   I spent a total of 40 Minutes    in face to face in clinical consultation, greater than 50% of which was counseling/coordinating care for lymphadenopathy.

## 2017-05-11 NOTE — Progress Notes (Signed)
COURTESY NOTE: We were contacted yesterday from ED regarding this 62 y/o [place] man presenting with weight loss, severe fatigue and hematuria, found to have a large pelvic mass with regional adenopathy and a PSA of 293. He was admitted for IR biopsy of this mass.  I have discussed the case with our prostate cancer oncologist, Dr Alen Blew, and he will schedule the patient for a visit in our clinic once the biopsy results are available.  Please let me know if I can be of further help

## 2017-05-11 NOTE — Care Management Note (Signed)
Case Management Note  Patient Details  Name: Anthony Villanueva MRN: [id number] Date of Birth: Mar 19, 1955  Subjective/Objective:                    Action/Plan:  [place] letter ( medication assistance ) given and explained to patient . Follow up appointment made at [facility] for May 25, 2017 at 1040 am. Appointment and contact information given to patient .   Patient voiced understanding to all of the above. Expected Discharge Date:                  Expected Discharge Plan:  Home/Self Care  In-House Referral:  Development worker, community, PCP / [facility]  Discharge planning Services  CM Consult, [facility], [facility]  Post Acute Care Choice:  NA Choice offered to:  Patient  DME Arranged:  N/A DME Agency:  NA  HH Arranged:  NA HH Agency:  NA  Status of Service:  Completed, signed off  If discussed at [place] of Stay Meetings, dates discussed:    Additional Comments:  Marilu Favre, RN 05/11/2017, 11:37 AM

## 2017-05-11 NOTE — Social Work (Addendum)
CSW acknowledging consult for PCP and no insurance.   Financial Counseling contacted by 6N CSW  to meet with pt regarding Medicaid eligibility. Pt's financial counselor assigned is Abelina Bachelor [phone].   CSW not able to register pt for insurance, aware that RN Case Manager has scheduled f/u appointment at a PCP and will provide pt with medication match letter.   2:21pm- CSW spoke with financial counselor, they have touched base with pt and will visit him again prior to discharge.   CSW signing off. Please consult if any additional needs arise.  Alexander Mt, [place] Work [phone]

## 2017-05-11 NOTE — Procedures (Signed)
  Procedure: CT core bx L retroperitoneal LAN 18g x6 in saline EBL:   minimal Complications:  none immediate  See full dictation in [employer].  Dillard Cannon MD Main # [phone] Pager  [phone]

## 2017-05-12 DIAGNOSIS — R1900 Intra-abdominal and pelvic swelling, mass and lump, unspecified site: Secondary | ICD-10-CM

## 2017-05-12 MED ORDER — POLYETHYLENE GLYCOL 3350 17 G PO PACK: 17.0000 g | PACK | Freq: Every day | ORAL | 0 refills | Status: DC

## 2017-05-12 MED ORDER — DOCUSATE SODIUM 100 MG PO CAPS: 100.0000 mg | ORAL_CAPSULE | Freq: Two times a day (BID) | ORAL | 0 refills | Status: DC

## 2017-05-12 MED ORDER — SENNA 8.6 MG PO TABS: 1.0000 | ORAL_TABLET | Freq: Every day | ORAL | 0 refills | Status: DC | PRN

## 2017-05-12 NOTE — Progress Notes (Signed)
Patient indicated that at approximately 3 AM his PIV was bothering him and he removed it.  He also was having pain his in left him and took a BC powder that he had.  Education was provided to him about taking his own medication while in the hospital and reaction with medication that we could be giving to him. Pt indicated that he understood the importance.

## 2017-05-12 NOTE — Discharge Summary (Signed)
Physician Discharge Summary  Anthony Villanueva ZOX:[id number] DOB: [birth year] DOA: 05/10/2017  PCP: No primary care provider on file.  Admit date: 05/10/2017 Discharge date: 05/12/2017  Time spent: 25 minutes  Recommendations for Outpatient Follow-up:   -Follow-up with oncology Dr. Osker Mason as planned  -Follow-up with urology  Discharge Diagnoses:  Principal Problem:   Pelvic mass Active Problems:   Constipation   Weight loss, unintentional   Hematuria   Discharge Condition: Fair  Diet recommendation: Soft, Regular  Filed Weights   05/10/17 2108 05/11/17 0500 05/12/17 0500  Weight: 77.1 kg (170 lb) 77.8 kg (171 lb 8.3 oz) 78.8 kg (173 lb 11.6 oz)    History of present illness:  Anthony Villanueva a 62 y.o.malewithno pastmedical historycomes in with complaints of constipation for over 3 weeks. Patient reports he has lost over 30 pounds in the last several weeks. He has been extremely fatigued and to the point where he had to quit his job yesterday.  Patient had a CT scan today which showed a very large pelvic mass likely an underlying prostate malignancy.PSA>200.  she was therefore admitted for workup.    Hospital Course:  Patient was admitted and assessed for possible malignancy. He is still constipated but has been on bowel regimen. Combination of multiple laxatives. He was seen by oncology while in the hospital. Core needle biopsy of the left retroperitoneal lymph node was done. Pathology report is currently pending. Patient is stable medically. Plan is to discharge him home to follow-up with oncology once biopsy results is back so they can plan the next step in his treatment. Patient was stable with no new complaints at the moment.  Procedures:  CT core biopsy of the left retroperitoneal lymph note  Consultations:  Dr. Lurline Del, Oncology  Dr Arne Cleveland, IR  Discharge Exam: Vitals:   05/11/17 2126 05/12/17 0430  BP: (!) 150/82 (!) 163/96  Pulse: 84 82   Resp: 17 17  Temp: 98.3 F (36.8 C) 98.2 F (36.8 C)  SpO2: 100% 100%    General: Stable, NAD Cardiovascular: RRR Respiratory: Good AE Bilaterally, No wheeze, No rales  Discharge Instructions   Discharge Instructions    Diet - low sodium heart healthy   Complete by:  As directed    Increase activity slowly   Complete by:  As directed      Allergies as of 05/12/2017   No Known Allergies     Medication List    TAKE these medications   acetaminophen 500 MG tablet Commonly known as:  TYLENOL Take 1,000 mg by mouth every 6 (six) hours as needed for mild pain.   ARTHRITIS STRENGTH BC POWDER PO Take 1 Package by mouth as needed (hip pain).   docusate sodium 100 MG capsule Commonly known as:  COLACE Take 1 capsule (100 mg total) by mouth 2 (two) times daily.   polyethylene glycol packet Commonly known as:  MIRALAX / GLYCOLAX Take 17 g by mouth daily.   senna 8.6 MG Tabs tablet Commonly known as:  SENOKOT Take 1 tablet (8.6 mg total) by mouth daily as needed for mild constipation.      No Known Allergies Follow-up Information    [facility]. Go to.   Why:  [facility]  [facility] follow up appointment Wednesday May 25, 2017 at 1040 am  Contact information: [place] [id number]           The results of  significant diagnostics from this hospitalization (including imaging, microbiology, ancillary and laboratory) are listed below for reference.    Significant Diagnostic Studies: Ct Abdomen Pelvis W Contrast  Result Date: 05/10/2017 CLINICAL DATA:  62 year old male with a history of weight loss EXAM: CT ABDOMEN AND PELVIS WITH CONTRAST TECHNIQUE: Multidetector CT imaging of the abdomen and pelvis was performed using the standard protocol following bolus administration of intravenous contrast. CONTRAST:  166mL ISOVUE-300 IOPAMIDOL (ISOVUE-300) INJECTION 61% COMPARISON:  None.  FINDINGS: Lower chest: No acute Hepatobiliary: Unremarkable appearance of liver parenchyma. Unremarkable gallbladder Pancreas: Unremarkable pancreas Spleen: Unremarkable spleen Adrenals/Urinary Tract: Unremarkable adrenal glands. Right kidney with no hydronephrosis or nephrolithiasis. Unremarkable course of the visualized ureter. There is a fluid density cystic structure at the superior right kidney measuring 3.2 cm. Left kidney without hydronephrosis or nephrolithiasis. Unremarkable course of the visualized left ureter. Urinary bladder is uplifted by the pelvic mass which is inseparable from the bladder base. No inflammatory changes of the urinary bladder. Stomach/Bowel: Unremarkable appearance of the stomach. Unremarkable small bowel with no abnormal distention. No transition point. Large stool burden without evidence of transition point. Normal appendix. Mild diverticular change of the sigmoid colon. The rectum is displaced posteriorly by pelvic mass. Vascular/Lymphatic: Heterogeneously enhancing pelvic mass which is centered between urinary bladder and rectum, with the largest diameter on axial images on image 71 measuring 6.6 cm x 9.6 cm. There is no differentiation of prostate gland. The mass demonstrates a cranial caudal dimension on the sagittal images of approximately 10.5 cm. Mass effect displaces the rectum posteriorly and up lives the urinary bladder. Mass is inseparable from the urinary bladder base. Associated pelvic lymphadenopathy of the pelvic sidewall with multiple pathologic lymph nodes of the pelvis and iliac stations. There is large lymph node mass of the periaortic/preaortic nodal stations at the level of the kidneys, with circumferential involvement of the aorta. The nodal mass surrounds bilateral renal arteries and bilateral renal veins. Multiple lymph nodes of the upper abdominal nodal stations at the level of the celiac artery. Mild atherosclerotic changes. No abdominal aortic aneurysm. No  dissection. Bilateral iliac arteries and the proximal profunda femoris and common femoral artery bilaterally are patent. The suprarenal IVC is patent. Below the renal vein inflow, IVC is not appreciated given the extensive lymphadenopathy. Reproductive: Prostate is not separable from the pelvic mass. Other: Small fat containing umbilical hernia. Musculoskeletal: No acute displaced fracture. Sclerotic focus at the anterior T12 and L2 vertebral body. No bony canal narrowing. Degenerative changes of the spine. Mild degenerative changes of the bilateral hips. There are small sclerotic foci within the bilateral iliac bones. IMPRESSION: Pelvic mass measuring 10 cm centered in the rectovesical space, inseparable from the adjacent anatomy. The etiology is favored to be prostate carcinoma, however, colon/rectal cancer or lymphoma are also considered. Referral for oncologic evaluation recommended, as well as correlation with lab values. Pelvic and retroperitoneal lymphadenopathy, including extensive periaortic nodal mass at the level of the kidneys surrounding the vasculature. No evidence of bowel obstruction.  Moderate stool burden. Diverticular change without evidence of acute diverticulitis. Small sclerotic focus of the anterior T12 and L2 vertebral bodies as well as small sclerotic foci the bilateral iliac bones. These are nonspecific, though could potentially represent bony metastases in the setting of prostate carcinoma. These results were called by telephone at the time of interpretation on 05/10/2017 at 5:34 pm to Dr. Jeannett Senior , who verbally acknowledged these results. Electronically Signed   By: Corrie Mckusick D.O.   On:  05/10/2017 17:34   Ct Biopsy  Result Date: 05/11/2017 CLINICAL DATA:  Large pelvic mass and confluent para-aortic and aortocaval retroperitoneal adenopathy. EXAM: CT GUIDED CORE BIOPSY OF LEFT RETROPERITONEAL ADENOPATHY ANESTHESIA/SEDATION: Intravenous Fentanyl and Versed were administered  as conscious sedation during continuous monitoring of the patient's level of consciousness and physiological / cardiorespiratory status by the radiology RN, with a total moderate sedation time of 10 minutes. PROCEDURE: The procedure risks, benefits, and alternatives were explained to the patient. Questions regarding the procedure were encouraged and answered. The patient understands and consents to the procedure. Patient placed prone. Select axial scans through the abdomen obtained. The left para-aortic adenopathy was localized and an appropriate skin entry site determined and marked. The operative field was prepped with chlorhexidinein a sterile fashion, and a sterile drape was applied covering the operative field. A sterile gown and sterile gloves were used for the procedure. Local anesthesia was provided with 1% Lidocaine. Under CT fluoroscopic guidance, a 17 gauge trocar needle was advanced to the margin of the lesion. Once needle tip position was confirmed, coaxial 18-gauge core biopsy samples were obtained, submitted in saline to surgical pathology. The guide needle was removed. Postprocedure scan shows no hemorrhage or other apparent complication. COMPLICATIONS: None immediate FINDINGS: Bulky confluent left para-aortic and aortocaval adenopathy was localized. Representative core biopsy samples obtained as above. IMPRESSION: 1. Technically successful CT-guided core biopsy, left retroperitoneal adenopathy. Electronically Signed   By: Lucrezia Europe M.D.   On: 05/11/2017 17:05    Microbiology: Recent Results (from the past 240 hour(s))  Urine culture     Status: Abnormal   Collection Time: 05/10/17  5:35 PM  Result Value Ref Range Status   Specimen Description URINE, RANDOM  Final   Special Requests NONE  Final   Culture (A)  Final    <10,000 COLONIES/mL INSIGNIFICANT GROWTH Performed at [facility], [address], [place], [place] [address]    Report Status 05/11/2017 FINAL  Final      Labs: Basic Metabolic Panel: Recent Labs  Lab 05/10/17 1154 05/11/17 0434  NA 137 137  K 4.1 3.5  CL 107 105  CO2 20* 20*  GLUCOSE 105* 86  BUN 14 9  CREATININE 0.85 0.82  CALCIUM 9.1 8.6*   Liver Function Tests: Recent Labs  Lab 05/10/17 1154  AST 19  ALT 13*  ALKPHOS 57  BILITOT 1.0  PROT 6.9  ALBUMIN 3.4*   Recent Labs  Lab 05/10/17 1154  LIPASE 43   No results for input(s): AMMONIA in the last 168 hours. CBC: Recent Labs  Lab 05/10/17 1154 05/11/17 0434  WBC 5.8 4.6  HGB 13.8 12.1*  HCT 41.9 36.6*  MCV 95.7 96.3  PLT 369 323   Cardiac Enzymes: No results for input(s): CKTOTAL, CKMB, CKMBINDEX, TROPONINI in the last 168 hours. BNP: BNP (last 3 results) No results for input(s): BNP in the last 8760 hours.  ProBNP (last 3 results) No results for input(s): PROBNP in the last 8760 hours.  CBG: No results for input(s): GLUCAP in the last 168 hours.     SignedBarbette Merino MD.  [facility] 05/12/2017, 9:45 AM

## 2017-05-12 NOTE — Progress Notes (Addendum)
Discharge instructions reviewed with pt and gave pt his prescriptions. Pt has his match letter and appointments for his new PCP and Oncologist. Pt to f/u MDs and pick up his prescriptions. Pt has his personal belonging with him, and left with them.  Pt leaving ambulatory

## 2017-05-18 ENCOUNTER — Emergency Department (HOSPITAL_COMMUNITY)
Admission: EM | Admit: 2017-05-18 | Discharge: 2017-05-18 | Disposition: A | Discharge status: Home / Self Care | Payer: Self-pay | Attending: Emergency Medicine | Admitting: Emergency Medicine

## 2017-05-18 ENCOUNTER — Encounter (HOSPITAL_COMMUNITY): Payer: Self-pay

## 2017-05-18 ENCOUNTER — Other Ambulatory Visit: Payer: Self-pay

## 2017-05-18 DIAGNOSIS — G8929 Other chronic pain: Secondary | ICD-10-CM | POA: Insufficient documentation

## 2017-05-18 DIAGNOSIS — F17210 Nicotine dependence, cigarettes, uncomplicated: Secondary | ICD-10-CM | POA: Insufficient documentation

## 2017-05-18 DIAGNOSIS — J45909 Unspecified asthma, uncomplicated: Secondary | ICD-10-CM | POA: Insufficient documentation

## 2017-05-18 DIAGNOSIS — M545 Low back pain: Secondary | ICD-10-CM | POA: Insufficient documentation

## 2017-05-18 MED ORDER — SENNA 8.6 MG PO TABS: 1.0000 | ORAL_TABLET | Freq: Every day | ORAL | 0 refills | Status: DC | PRN

## 2017-05-18 MED ORDER — OXYCODONE-ACETAMINOPHEN 5-325 MG PO TABS
1.0000 | ORAL_TABLET | Freq: Once | ORAL | Status: AC
  Administered 2017-05-18: 1 via ORAL
  Filled 2017-05-18: qty 1

## 2017-05-18 MED ORDER — OXYCODONE-ACETAMINOPHEN 5-325 MG PO TABS: 1.0000 | ORAL_TABLET | ORAL | 0 refills | Status: DC | PRN

## 2017-05-18 MED ORDER — DOCUSATE SODIUM 100 MG PO CAPS: 100.0000 mg | ORAL_CAPSULE | Freq: Two times a day (BID) | ORAL | 0 refills | Status: DC

## 2017-05-18 NOTE — Discharge Instructions (Addendum)
Please fill your prescriptions for percocet (pain medicine), senna and colace (constipation medicine.)   Go to your appointment tomorrow with the cancer doctor.   Please return to the ER if you have any new or worsening symptoms like fever, vomiting, abdominal pain.

## 2017-05-18 NOTE — ED Provider Notes (Signed)
[facility] Provider Note   CSN: [id number] Arrival date & time: 05/18/17  1044     History   Chief Complaint Chief Complaint  Patient presents with  . Back Pain    HPI Anthony Villanueva is a 62 y.o. male.  HPI  Mr. Folmar is a 62yo male with a history of pelvic mass who presents to the Emergency Department for ongoing bilateral lower back pain. Per chart review, patient was admitted last week after presenting to the ED with loss of appetite, weight loss and fatigue. He had a CT abdomen/pelvis scan which showed 10 cm pelvic mass in retrovesical space which was thought to be prostate carcinoma vs colo/rectal cancer or lymphoma. He was admitted and had IR-guided core biopsy of retroperitoneal lymph node. He has a follow up appointment with oncology tomorrow for further management. He states that he is planning on seeing oncologist tomorrow, but came in today because of bilateral lower back pain.   He states this has been ongoing for weeks but seems to be worsening. Pain is constant and feels sharp and stabbing. Seems to be worse at night. He has tried taking BC powder without significant relief. He also states that he continues to be constipated. Was given a voucher for prescription medication but was unable to fill senna and colace prescription because they are sold over the counter. He denies fevers, chills, abdominal pain, n/v, dysuria, numbness, weakness, loss of bowel or bladder control, urinary retention. Able to ambulate independently without trouble.   Past Medical History:  Diagnosis Date  . Childhood asthma     Patient Active Problem List   Diagnosis Date Noted  . Pelvic mass 05/10/2017  . Constipation 05/10/2017  . Weight loss, unintentional 05/10/2017  . Hematuria 05/10/2017    Past Surgical History:  Procedure Laterality Date  . PROSTATE BIOPSY  05/11/2017   CT core bx L retroperitoneal LAN Archie Endo 05/11/2017        Home  Medications    Prior to Admission medications   Medication Sig Start Date End Date Taking? Authorizing Provider  acetaminophen (TYLENOL) 500 MG tablet Take 1,000 mg by mouth every 6 (six) hours as needed for mild pain.    [provider]  Aspirin-Salicylamide-Caffeine (ARTHRITIS STRENGTH BC POWDER PO) Take 1 Package by mouth as needed (hip pain).    [provider]  docusate sodium (COLACE) 100 MG capsule Take 1 capsule (100 mg total) by mouth 2 (two) times daily. 05/12/17   Elwyn Reach, MD  polyethylene glycol (MIRALAX / GLYCOLAX) packet Take 17 g by mouth daily. 05/12/17 06/11/17  Elwyn Reach, MD  senna (SENOKOT) 8.6 MG TABS tablet Take 1 tablet (8.6 mg total) by mouth daily as needed for mild constipation. 05/12/17   Elwyn Reach, MD    Family History No family history on file.  Social History Social History   Tobacco Use  . Smoking status: Current Every Day Smoker    Packs/day: 0.25    Years: 43.00    Pack years: 10.75    Types: Cigarettes  . Smokeless tobacco: Never Used  Substance Use Topics  . Alcohol use: Yes    Alcohol/week: 13.8 oz    Types: 23 Cans of beer per week    Comment: 05/11/2017 "40oz beer/day"  . Drug use: Yes    Types: Marijuana    Comment: 05/11/2017 "daily"     Allergies   Patient has no known allergies.   Review of  Systems Review of Systems  Gastrointestinal: Negative for abdominal pain, nausea and vomiting.  Genitourinary: Negative for difficulty urinating.  Musculoskeletal: Positive for back pain (bilateral). Negative for gait problem.  Skin: Negative for rash and wound.  Neurological: Negative for weakness and numbness.     Physical Exam Updated Vital Signs BP (!) 146/88 (BP Location: Right Arm)   Pulse (!) 110   Temp 98.8 F (37.1 C) (Oral)   Resp 14   Ht 6' (1.829 m)   Wt 77.1 kg (170 lb)   SpO2 100%   BMI 23.06 kg/m   Physical Exam  Constitutional: He appears well-developed and well-nourished. No  distress.  HENT:  Head: Normocephalic and atraumatic.  Eyes: Right eye exhibits no discharge. Left eye exhibits no discharge.  Pulmonary/Chest: Effort normal. No respiratory distress.  Abdominal: Soft. Bowel sounds are normal. There is no tenderness. There is no guarding.  Musculoskeletal:  No tenderness over T-spine or L-spine.  No paraspinal muscle tenderness of the lumbar spine.  Strength 5/5 in bilateral knee flexion/extension and ankle dorsiflexion/plantarflexion.  DP pulses 2+ bilaterally.  Neurological: He is alert. Coordination normal.  Patellar reflex 1+ and symmetric bilaterally.  Distal sensation to light touch intact in bilateral lower extremities.  Gait normal and coordination and balance.  Skin: Skin is warm and dry. Capillary refill takes less than 2 seconds. He is not diaphoretic.  Psychiatric: He has a normal mood and affect. His behavior is normal.  Nursing note and vitals reviewed.    ED Treatments / Results  Labs (all labs ordered are listed, but only abnormal results are displayed) Labs Reviewed - No data to display  EKG None  Radiology No results found.  Procedures Procedures (including critical care time)  Medications Ordered in ED Medications  oxyCODONE-acetaminophen (PERCOCET/ROXICET) 5-325 MG per tablet 1 tablet (1 tablet Oral Given 05/18/17 1400)     Initial Impression / Assessment and Plan / ED Course  I have reviewed the triage vital signs and the nursing notes.  Pertinent labs & imaging results that were available during my care of the patient were reviewed by me and considered in my medical decision making (see chart for details).     Patient with a history of pelvic mass with CT evidence (05/10/17) of bony mets in the T12 and L2 vertebral bodies and bilateral iliac bones presents with ongoing lower back pain. Main likely related to bony mets. On exam he has full strength in bilateral LE, sensation intact, no loss of bowel or bladder control. He  is able to ambulate independently. No concern for cauda equina. Patient has follow up appointment with oncology tomorrow.   Discussed this patient with Dr. Regenia Skeeter who agrees with short course of pain medication until he can be seen by oncology. Counseled this patient that this medication can make him more constipated. Spoke with case management, Rosendo Gros, who saw the patient and is referring [facility] to [facility] to pick up prescriptions for senna and colace.   No vomiting, abdominal pain or concern for acute bowel obstruction. Discussed reasons to return to the ER. Discussed the importance of him following up with oncology appointment tomorrow. Patient agrees and voices understanding to the above pan and appears reliable to follow up.   Final Clinical Impressions(s) / ED Diagnoses   Final diagnoses:  Chronic bilateral low back pain without sciatica    ED Discharge Orders        Ordered    oxyCODONE-acetaminophen (PERCOCET/ROXICET) 5-325 MG tablet  Every 4 hours PRN     05/18/17 1504    docusate sodium (COLACE) 100 MG capsule  2 times daily     05/18/17 1504    senna (SENOKOT) 8.6 MG TABS tablet  Daily PRN     05/18/17 1504       Glyn Ade, PA-C 05/19/17 1130    Sherwood Gambler, MD 05/20/17 [phone]

## 2017-05-18 NOTE — ED Triage Notes (Signed)
Pt states he is having increasing back and hip pain since last visit about one week ago. Pt states he has also had continued trouble with constipation and decreased appetite.

## 2017-05-19 ENCOUNTER — Inpatient Hospital Stay: Payer: Self-pay | Attending: Oncology | Admitting: Oncology

## 2017-05-19 ENCOUNTER — Telehealth: Payer: Self-pay | Admitting: Oncology

## 2017-05-19 ENCOUNTER — Encounter: Payer: Self-pay | Admitting: Medical Oncology

## 2017-05-19 VITALS — BP 154/85 | HR 111 | Temp 98.5°F | Resp 18 | Ht 72.0 in | Wt 171.3 lb

## 2017-05-19 DIAGNOSIS — Z5111 Encounter for antineoplastic chemotherapy: Secondary | ICD-10-CM | POA: Insufficient documentation

## 2017-05-19 DIAGNOSIS — R319 Hematuria, unspecified: Secondary | ICD-10-CM

## 2017-05-19 DIAGNOSIS — C61 Malignant neoplasm of prostate: Secondary | ICD-10-CM | POA: Insufficient documentation

## 2017-05-19 DIAGNOSIS — N390 Urinary tract infection, site not specified: Secondary | ICD-10-CM

## 2017-05-19 MED ORDER — BICALUTAMIDE 50 MG PO TABS: 50.0000 mg | ORAL_TABLET | Freq: Every day | ORAL | 0 refills | Status: DC

## 2017-05-19 NOTE — Progress Notes (Signed)
Reason for Referral: Prostate cancer.  HPI: 62 year old gentleman currently of [place] where he lived the majority of his life.  He does not report any significant past medical history before presenting on May 10, 2017 with symptoms of weight loss and increased pain as well as constipation.  Evaluation in the emergency department showed a large pelvic mass on CT scan measuring close to 10 cm.  Biopsy obtained at the time which showed adenocarcinoma with positive PSA staining.  His serum PSA was elevated at 293.  He was discharged on stool softeners and pain medication.  He was seen in the emergency department again on 05/18/2017 for increased pain and was given Percocet at that time.  He has reported symptoms of frequency and urgency and last few years that he has ignored.  Since his discharge, he continues to have issues with inability to move his bowels.  He is able to pass gas and liquid but no solid fecal material.  He denies any abdominal pain, nausea or vomiting.  He has lost close to 40 pounds at this time.  He still able to ambulate and perform activities of daily living but has been overall weak.  His only pain concern is his hip which has been chronic in nature.  He does not report any headaches, blurry vision, syncope or seizures. Does not report any fevers, chills or sweats.  Does not report any cough, wheezing or hemoptysis.  Does not report any chest pain, palpitation, orthopnea or leg edema.  Does not report any nausea, vomiting or abdominal pain. Does not report any skeletal complaints.    Does not report frequency, urgency.  He does report hematuria.  Does not report any skin rashes or lesions. Does not report any heat or cold intolerance.  Does not report any lymphadenopathy or petechiae.  Does not report any anxiety or depression.  Remaining review of systems is negative.    Past Medical History:  Diagnosis Date  . Childhood asthma   :  Past Surgical History:  Procedure  Laterality Date  . PROSTATE BIOPSY  05/11/2017   CT core bx L retroperitoneal LAN /notes 05/11/2017  :   Current Outpatient Medications:  .  acetaminophen (TYLENOL) 500 MG tablet, Take 1,000 mg by mouth every 6 (six) hours as needed for mild pain., Disp: , Rfl:  .  Aspirin-Salicylamide-Caffeine (ARTHRITIS STRENGTH BC POWDER PO), Take 1 Package by mouth as needed (hip pain)., Disp: , Rfl:  .  docusate sodium (COLACE) 100 MG capsule, Take 1 capsule (100 mg total) by mouth 2 (two) times daily., Disp: 60 capsule, Rfl: 0 .  oxyCODONE-acetaminophen (PERCOCET/ROXICET) 5-325 MG tablet, Take 1 tablet by mouth every 4 (four) hours as needed for severe pain., Disp: 10 tablet, Rfl: 0 .  polyethylene glycol (MIRALAX / GLYCOLAX) packet, Take 17 g by mouth daily., Disp: 30 packet, Rfl: 0 .  senna (SENOKOT) 8.6 MG TABS tablet, Take 1 tablet (8.6 mg total) by mouth daily as needed for mild constipation., Disp: 120 each, Rfl: 0:  No Known Allergies:  No family history on file.:  Social History   Socioeconomic History  . Marital status: Single    Spouse name: Not on file  . Number of children: Not on file  . Years of education: Not on file  . Highest education level: Not on file  Occupational History  . Not on file  Social Needs  . Financial resource strain: Not on file  . Food insecurity:    Worry: Not  on file    Inability: Not on file  . Transportation needs:    Medical: Not on file    Non-medical: Not on file  Tobacco Use  . Smoking status: Current Every Day Smoker    Packs/day: 0.25    Years: 43.00    Pack years: 10.75    Types: Cigarettes  . Smokeless tobacco: Never Used  Substance and Sexual Activity  . Alcohol use: Yes    Alcohol/week: 13.8 oz    Types: 23 Cans of beer per week    Comment: 05/11/2017 "40oz beer/day"  . Drug use: Yes    Types: Marijuana    Comment: 05/11/2017 "daily"  . Sexual activity: Not Currently  Lifestyle  . Physical activity:    Days per week: Not on file     Minutes per session: Not on file  . Stress: Not on file  Relationships  . Social connections:    Talks on phone: Not on file    Gets together: Not on file    Attends religious service: Not on file    Active member of club or organization: Not on file    Attends meetings of clubs or organizations: Not on file    Relationship status: Not on file  . Intimate partner violence:    Fear of current or ex partner: Not on file    Emotionally abused: Not on file    Physically abused: Not on file    Forced sexual activity: Not on file  Other Topics Concern  . Not on file  Social History Narrative  . Not on file  :  Pertinent items are noted in HPI.  Exam: Blood pressure (!) 154/85, pulse (!) 111, temperature 98.5 F (36.9 C), temperature source Oral, resp. rate 18, height 6' (1.829 m), weight 171 lb 4.8 oz (77.7 kg), SpO2 100 %.   General appearance: alert and cooperative appeared without distress.  Appears slightly cachectic. Head: atraumatic without any abnormalities. Eyes: conjunctivae/corneas clear. PERRL.  Sclera anicteric. Throat: lips, mucosa, and tongue normal; without oral thrush or ulcers. Resp: clear to auscultation bilaterally without rhonchi, wheezes or dullness to percussion. Cardio: regular rate and rhythm, S1, S2 normal, no murmur, click, rub or gallop GI: soft, non-tender; bowel sounds normal; no masses,  no organomegaly Skin: Skin color, texture, turgor normal. No rashes or lesions Lymph nodes: Cervical, supraclavicular, and axillary nodes normal. Neurologic: Grossly normal without any motor, sensory or deep tendon reflexes. Musculoskeletal: No joint deformity or effusion.  CBC    Component Value Date/Time   WBC 4.6 05/11/2017 0434   RBC 3.80 (L) 05/11/2017 0434   HGB 12.1 (L) 05/11/2017 0434   HCT 36.6 (L) 05/11/2017 0434   PLT 323 05/11/2017 0434   MCV 96.3 05/11/2017 0434   MCH 31.8 05/11/2017 0434   MCHC 33.1 05/11/2017 0434   RDW 13.4 05/11/2017 0434      Ct Abdomen Pelvis W Contrast  Result Date: 05/10/2017 CLINICAL DATA:  62 year old male with a history of weight loss EXAM: CT ABDOMEN AND PELVIS WITH CONTRAST TECHNIQUE: Multidetector CT imaging of the abdomen and pelvis was performed using the standard protocol following bolus administration of intravenous contrast. CONTRAST:  176mL ISOVUE-300 IOPAMIDOL (ISOVUE-300) INJECTION 61% COMPARISON:  None. FINDINGS: Lower chest: No acute Hepatobiliary: Unremarkable appearance of liver parenchyma. Unremarkable gallbladder Pancreas: Unremarkable pancreas Spleen: Unremarkable spleen Adrenals/Urinary Tract: Unremarkable adrenal glands. Right kidney with no hydronephrosis or nephrolithiasis. Unremarkable course of the visualized ureter. There is a fluid density cystic structure  at the superior right kidney measuring 3.2 cm. Left kidney without hydronephrosis or nephrolithiasis. Unremarkable course of the visualized left ureter. Urinary bladder is uplifted by the pelvic mass which is inseparable from the bladder base. No inflammatory changes of the urinary bladder. Stomach/Bowel: Unremarkable appearance of the stomach. Unremarkable small bowel with no abnormal distention. No transition point. Large stool burden without evidence of transition point. Normal appendix. Mild diverticular change of the sigmoid colon. The rectum is displaced posteriorly by pelvic mass. Vascular/Lymphatic: Heterogeneously enhancing pelvic mass which is centered between urinary bladder and rectum, with the largest diameter on axial images on image 71 measuring 6.6 cm x 9.6 cm. There is no differentiation of prostate gland. The mass demonstrates a cranial caudal dimension on the sagittal images of approximately 10.5 cm. Mass effect displaces the rectum posteriorly and up lives the urinary bladder. Mass is inseparable from the urinary bladder base. Associated pelvic lymphadenopathy of the pelvic sidewall with multiple pathologic lymph nodes of the  pelvis and iliac stations. There is large lymph node mass of the periaortic/preaortic nodal stations at the level of the kidneys, with circumferential involvement of the aorta. The nodal mass surrounds bilateral renal arteries and bilateral renal veins. Multiple lymph nodes of the upper abdominal nodal stations at the level of the celiac artery. Mild atherosclerotic changes. No abdominal aortic aneurysm. No dissection. Bilateral iliac arteries and the proximal profunda femoris and common femoral artery bilaterally are patent. The suprarenal IVC is patent. Below the renal vein inflow, IVC is not appreciated given the extensive lymphadenopathy. Reproductive: Prostate is not separable from the pelvic mass. Other: Small fat containing umbilical hernia. Musculoskeletal: No acute displaced fracture. Sclerotic focus at the anterior T12 and L2 vertebral body. No bony canal narrowing. Degenerative changes of the spine. Mild degenerative changes of the bilateral hips. There are small sclerotic foci within the bilateral iliac bones. IMPRESSION: Pelvic mass measuring 10 cm centered in the rectovesical space, inseparable from the adjacent anatomy. The etiology is favored to be prostate carcinoma, however, colon/rectal cancer or lymphoma are also considered. Referral for oncologic evaluation recommended, as well as correlation with lab values. Pelvic and retroperitoneal lymphadenopathy, including extensive periaortic nodal mass at the level of the kidneys surrounding the vasculature. No evidence of bowel obstruction.  Moderate stool burden. Diverticular change without evidence of acute diverticulitis. Small sclerotic focus of the anterior T12 and L2 vertebral bodies as well as small sclerotic foci the bilateral iliac bones. These are nonspecific, though could potentially represent bony metastases in the setting of prostate carcinoma. These results were called by telephone at the time of interpretation on 05/10/2017 at 5:34 pm to  Dr. Jeannett Senior , who verbally acknowledged these results. Electronically Signed   By: Corrie Mckusick D.O.   On: 05/10/2017 17:34      Assessment and Plan:   62 year old gentleman with the following issues:  1.  Advanced prostate cancer diagnosed in April 2019.  He presented with a pelvic mass measuring 10 cm in the retrovesical space causing pelvic discomfort and constipation.  This has been biopsied which showed prostate adenocarcinoma.  His PSA is 293.  The natural course of this disease was discussed today with the patient and treatment approach was discussed.  The first step is to complete his staging workup and he will require a bone scan to further quantify his disease bulk especially in the bone.  Androgen deprivation therapy remains the backbone of the treatment of prostate cancer and given his symptoms, I am  in favor of starting it in the immediate future.  Long-term complication associated with androgen deprivation therapy was discussed which include osteoporosis, weight gain, loss of muscle mass, sexual dysfunction among others.  Treatment should be effective in decreasing his symptoms.  Additional treatments utilizing Zytiga or systemic chemotherapy would be considered once his treatment commenced and his symptoms improved slightly.  I will start him on Casodex as well for 30 days to combat the possibility of the flare phenomenon.  2.  Hematuria and lower urinary tract symptoms: I will refer him to urology for an evaluation and potentially prostate biopsy at that time.  He does not have any obstructive symptoms at this time with adequate kidney function at this time.  3.  Colonic obstruction: He does not appear to be complete at this time as he is moving liquid bowel movements but no solids at this time.  Initiating androgen deprivation therapy hopefully will shrink his tumor adequately to give him some symptom relief.  Radiation therapy could be a possibility if he has no relief  from his symptoms.  For the time being I have recommended continue stool softeners and adequate hydration and high-fiber diet.  4.  Pain: Prescription for Percocet was given to the patient and his pain has not been an issue at this time.  5.  Bone directed therapy: We will evaluate that in the future after obtaining bone scan.  6.  Prognosis: He has advanced malignancy that is incurable.  His performance status is excellent and aggressive treatment is warranted.  7.  Follow-up: In the next few weeks to follow his progress.  60  minutes was spent with the patient face-to-face today.  More than 50% of time was dedicated to patient counseling, education and coordination of his care.

## 2017-05-19 NOTE — Telephone Encounter (Signed)
Scheduled appt per 4/11 los - Gave patient AVs and calender per los.  

## 2017-05-20 ENCOUNTER — Telehealth: Payer: Self-pay | Admitting: Oncology

## 2017-05-20 ENCOUNTER — Other Ambulatory Visit: Payer: Self-pay | Admitting: Oncology

## 2017-05-20 NOTE — Telephone Encounter (Signed)
Faxed records to [facility] °

## 2017-05-25 ENCOUNTER — Ambulatory Visit (INDEPENDENT_AMBULATORY_CARE_PROVIDER_SITE_OTHER): Payer: Self-pay | Admitting: Family Medicine

## 2017-05-25 ENCOUNTER — Encounter: Payer: Self-pay | Admitting: Family Medicine

## 2017-05-25 VITALS — BP 135/86 | HR 112 | Temp 98.2°F | Resp 16 | Ht 73.0 in | Wt 169.0 lb

## 2017-05-25 DIAGNOSIS — R1900 Intra-abdominal and pelvic swelling, mass and lump, unspecified site: Secondary | ICD-10-CM

## 2017-05-25 DIAGNOSIS — R634 Abnormal weight loss: Secondary | ICD-10-CM

## 2017-05-25 DIAGNOSIS — R8290 Unspecified abnormal findings in urine: Secondary | ICD-10-CM

## 2017-05-25 DIAGNOSIS — K5901 Slow transit constipation: Secondary | ICD-10-CM

## 2017-05-25 DIAGNOSIS — Z23 Encounter for immunization: Secondary | ICD-10-CM

## 2017-05-25 DIAGNOSIS — C61 Malignant neoplasm of prostate: Secondary | ICD-10-CM

## 2017-05-25 DIAGNOSIS — R159 Full incontinence of feces: Secondary | ICD-10-CM

## 2017-05-25 DIAGNOSIS — M25551 Pain in right hip: Secondary | ICD-10-CM

## 2017-05-25 DIAGNOSIS — R310 Gross hematuria: Secondary | ICD-10-CM

## 2017-05-25 LAB — POCT URINALYSIS DIPSTICK
Glucose, UA: NEGATIVE
Nitrite, UA: NEGATIVE
Protein, UA: >=300
Urobilinogen, UA: 1 E.U./dL
pH, UA: 5.5 (ref 5.0–8.0)

## 2017-05-25 MED ORDER — ACETAMINOPHEN 500 MG PO TABS: 500.0000 mg | ORAL_TABLET | Freq: Four times a day (QID) | ORAL | 1 refills | Status: DC | PRN

## 2017-05-25 MED ORDER — KETOROLAC TROMETHAMINE 60 MG/2ML IM SOLN
60.0000 mg | Freq: Once | INTRAMUSCULAR | Status: AC
  Administered 2017-05-25: 60 mg via INTRAMUSCULAR

## 2017-05-25 MED ORDER — POLYETHYLENE GLYCOL 3350 17 G PO PACK: 17.0000 g | PACK | Freq: Every day | ORAL | 0 refills | Status: DC

## 2017-05-25 MED ORDER — TRAMADOL HCL 50 MG PO TABS: 50.0000 mg | ORAL_TABLET | Freq: Four times a day (QID) | ORAL | 0 refills | Status: DC | PRN

## 2017-05-25 NOTE — Progress Notes (Signed)
Subjective:    Patient ID: Anthony Villanueva, male    DOB: Feb 25, 1955, 62 y.o.   MRN: [id number]  HPI Anthony Villanueva, a 62 year old male with a medical history significant for new diagnosis of prostate cancer presents to establish care and for post hospital follow up. He has not had a primary provider due to financial and insurance constraints.  He was admitted to inpatient services on 05/11/2017. Prior to admission, patient had a 3 week history of constipation and a 30 pound weight loss. He also endorsed extreme fatigue.  CT scan showed large pelvic mass and PSA was greater than 200. Patient has undergone a core needle biopsy of the left retroperitoneal lymph node and is scheduled to follow up with Dr. Zola Button on 05/26/2017. Patient continues to endorse fatigue and weakness.  Current Outpatient Medications on File Prior to Visit  Medication Sig Dispense Refill  . Aspirin-Salicylamide-Caffeine (ARTHRITIS STRENGTH BC POWDER PO) Take 1 Package by mouth as needed (hip pain).    . bicalutamide (CASODEX) 50 MG tablet Take 1 tablet (50 mg total) by mouth daily. (Patient not taking: Reported on 05/25/2017) 30 tablet 0  . docusate sodium (COLACE) 100 MG capsule Take 1 capsule (100 mg total) by mouth 2 (two) times daily. (Patient not taking: Reported on 05/25/2017) 60 capsule 0  . oxyCODONE-acetaminophen (PERCOCET/ROXICET) 5-325 MG tablet Take 1 tablet by mouth every 4 (four) hours as needed for severe pain. (Patient not taking: Reported on 05/25/2017) 10 tablet 0   No current facility-administered medications on file prior to visit.    Past Medical History:  Diagnosis Date  . Childhood asthma    Immunization History  Administered Date(s) Administered  . Pneumococcal Polysaccharide-23 05/25/2017  . Tdap 05/25/2017   Social History   Socioeconomic History  . Marital status: Single    Spouse name: Not on file  . Number of children: Not on file  . Years of education: Not on file  . Highest education  level: Not on file  Occupational History  . Not on file  Social Needs  . Financial resource strain: Not on file  . Food insecurity:    Worry: Not on file    Inability: Not on file  . Transportation needs:    Medical: Not on file    Non-medical: Not on file  Tobacco Use  . Smoking status: Current Every Day Smoker    Packs/day: 0.25    Years: 43.00    Pack years: 10.75    Types: Cigarettes  . Smokeless tobacco: Never Used  Substance and Sexual Activity  . Alcohol use: Yes    Alcohol/week: 13.8 oz    Types: 23 Cans of beer per week    Comment: 05/11/2017 "40oz beer/day"  . Drug use: Yes    Types: Marijuana    Comment: 05/11/2017 "daily"  . Sexual activity: Not Currently  Lifestyle  . Physical activity:    Days per week: Not on file    Minutes per session: Not on file  . Stress: Not on file  Relationships  . Social connections:    Talks on phone: Not on file    Gets together: Not on file    Attends religious service: Not on file    Active member of club or organization: Not on file    Attends meetings of clubs or organizations: Not on file    Relationship status: Not on file  . Intimate partner violence:    Fear of current or ex  partner: Not on file    Emotionally abused: Not on file    Physically abused: Not on file    Forced sexual activity: Not on file  Other Topics Concern  . Not on file  Social History Narrative  . Not on file    Review of Systems  Constitutional: Positive for fatigue.  HENT: Negative.   Cardiovascular: Negative.   Gastrointestinal: Negative.   Endocrine: Negative for polydipsia, polyphagia and polyuria.  Genitourinary: Positive for hematuria.  Musculoskeletal: Positive for arthralgias (right hip pain).  Skin: Negative.   Allergic/Immunologic: Negative.   Neurological: Negative.   Psychiatric/Behavioral: Negative.        Objective:   Physical Exam  Constitutional: He is oriented to person, place, and time.  Cardiovascular: Normal  rate, normal heart sounds and intact distal pulses.  Pulmonary/Chest: Effort normal and breath sounds normal.  Abdominal: Soft. Bowel sounds are normal. There is generalized tenderness.  Musculoskeletal:       Right hip: He exhibits decreased range of motion and decreased strength.  Neurological: He is alert and oriented to person, place, and time. He has normal reflexes.  Skin: Skin is warm and dry.  Psychiatric: He has a normal mood and affect. His behavior is normal. Judgment and thought content normal.      BP 135/86 (BP Location: Left Arm, Patient Position: Sitting, Cuff Size: Normal)   Pulse (!) 112   Temp 98.2 F (36.8 C) (Oral)   Resp 16   Ht 6\' 1"  (1.854 m)   Wt 169 lb (76.7 kg)   SpO2 100%   BMI 22.30 kg/m  Assessment & Plan:  1. Right hip pain - acetaminophen (TYLENOL) 500 MG tablet; Take 1 tablet (500 mg total) by mouth every 6 (six) hours as needed for mild pain.  Dispense: 30 tablet; Refill: 1 - ketorolac (TORADOL) injection 60 mg - traMADol (ULTRAM) 50 MG tablet; Take 1 tablet (50 mg total) by mouth every 6 (six) hours as needed.  Dispense: 15 tablet; Refill: 0  2. Weight loss, unintentional Discussed adding Ensure to diet regimen.  Patient will establish care with oncology on 05/26/2017.  Will benefit from nutritional consult  3. Prostate cancer [facility]) Establish care with oncology on 05/26/2017  4. Gross hematuria  - Urinalysis Dipstick  5. Slow transit constipation A high fiber diet with plenty of fluids (up to 8 glasses of water daily) is suggested to relieve these symptoms.  Metamucil, 1 tablespoon once or twice daily can be used to keep bowels regular if needed. Also, continue Colace 100 mg daily  6. Incontinence of feces, unspecified fecal incontinence type Discusses pelvic mass at length. I suspect that pelvic mass is the source of fecal incontinence.   7. Pelvic mass Refer to # 6  8. Immunization due - Pneumococcal polysaccharide vaccine 23-valent  greater than or equal to 2yo subcutaneous/IM  9. Need for Tdap vaccination - Tdap vaccine greater than or equal to 7yo IM  10. Abnormal urinalysis - Urine Culture   RTC: 3 months  Donia Pounds  MSN, FNP-C [facility] [address] [place], [place] [id number] [phone]

## 2017-05-25 NOTE — Patient Instructions (Signed)
Please follow-up with oncology as scheduled on 05/26/2017.  I will defer to oncology for further lab values.  For right hip pain we will start a trial of Tylenol 500 mg every 6 hours as needed for mild to moderate pain.  For moderate to severe pain take tramadol 50 mg every 6 hours as needed. I will defer to Dr. Alen Blew for fecal incontinence. You receive your immunizations today. Medications were sent to [facility] wellness, hold laxatives for right now    I encourage you to complete financial assistance forms as discussed.  The forms are located in the lobby.  Please complete form and schedule appointment with financial counselor at [employer] and wellness.

## 2017-05-26 ENCOUNTER — Telehealth: Payer: Self-pay | Admitting: Oncology

## 2017-05-26 ENCOUNTER — Inpatient Hospital Stay: Payer: Self-pay

## 2017-05-26 NOTE — Telephone Encounter (Signed)
Patient called to cancel he wants to reschedule however could not reach him on a return call

## 2017-05-27 LAB — URINE CULTURE

## 2017-05-31 ENCOUNTER — Encounter: Payer: Self-pay | Admitting: Oncology

## 2017-05-31 ENCOUNTER — Inpatient Hospital Stay: Payer: Self-pay

## 2017-05-31 VITALS — BP 109/90 | HR 105 | Temp 98.3°F | Resp 18

## 2017-05-31 DIAGNOSIS — C61 Malignant neoplasm of prostate: Secondary | ICD-10-CM

## 2017-05-31 MED ORDER — LEUPROLIDE ACETATE (4 MONTH) 30 MG IM KIT
30.0000 mg | PACK | Freq: Once | INTRAMUSCULAR | Status: AC
  Administered 2017-05-31: 30 mg via INTRAMUSCULAR
  Filled 2017-05-31: qty 30

## 2017-05-31 NOTE — Progress Notes (Signed)
Reviewed patient from 3 day lookback. Patient has an appointment today and Rob has a note in to see him. Asked Rob if patient will be bringing proof of income to him and if so if he will have a copy for me to apply for [facility] and Prostate grants. Rob will communicate with patient.

## 2017-05-31 NOTE — Patient Instructions (Signed)
Leuprolide depot injection What is this medicine? LEUPROLIDE (loo PROE lide) is a man-made protein that acts like a natural hormone in the body. It decreases testosterone in men and decreases estrogen in women. In men, this medicine is used to treat advanced prostate cancer. In women, some forms of this medicine may be used to treat endometriosis, uterine fibroids, or other male hormone-related problems. This medicine may be used for other purposes; ask your health care provider or pharmacist if you have questions. COMMON BRAND NAME(S): Eligard, Lupron Depot, Lupron Depot-Ped, Viadur What should I tell my health care provider before I take this medicine? They need to know if you have any of these conditions: -diabetes -heart disease or previous heart attack -high blood pressure -high cholesterol -mental illness -osteoporosis -pain or difficulty passing urine -seizures -spinal cord metastasis -stroke -suicidal thoughts, plans, or attempt; a previous suicide attempt by you or a family member -tobacco smoker -unusual vaginal bleeding (women) -an unusual or allergic reaction to leuprolide, benzyl alcohol, other medicines, foods, dyes, or preservatives -pregnant or trying to get pregnant -breast-feeding How should I use this medicine? This medicine is for injection into a muscle or for injection under the skin. It is given by a health care professional in a hospital or clinic setting. The specific product will determine how it will be given to you. Make sure you understand which product you receive and how often you will receive it. Talk to your pediatrician regarding the use of this medicine in children. Special care may be needed. Overdosage: If you think you have taken too much of this medicine contact a poison control center or emergency room at once. NOTE: This medicine is only for you. Do not share this medicine with others. What if I miss a dose? It is important not to miss a dose.  Call your doctor or health care professional if you are unable to keep an appointment. Depot injections: Depot injections are given either once-monthly, every 12 weeks, every 16 weeks, or every 24 weeks depending on the product you are prescribed. The product you are prescribed will be based on if you are male or male, and your condition. Make sure you understand your product and dosing. What may interact with this medicine? Do not take this medicine with any of the following medications: -chasteberry This medicine may also interact with the following medications: -herbal or dietary supplements, like black cohosh or DHEA -male hormones, like estrogens or progestins and birth control pills, patches, rings, or injections -male hormones, like testosterone This list may not describe all possible interactions. Give your health care provider a list of all the medicines, herbs, non-prescription drugs, or dietary supplements you use. Also tell them if you smoke, drink alcohol, or use illegal drugs. Some items may interact with your medicine. What should I watch for while using this medicine? Visit your doctor or health care professional for regular checks on your progress. During the first weeks of treatment, your symptoms may get worse, but then will improve as you continue your treatment. You may get hot flashes, increased bone pain, increased difficulty passing urine, or an aggravation of nerve symptoms. Discuss these effects with your doctor or health care professional, some of them may improve with continued use of this medicine. Male patients may experience a menstrual cycle or spotting during the first months of therapy with this medicine. If this continues, contact your doctor or health care professional. What side effects may I notice from receiving this medicine? Side   effects that you should report to your doctor or health care professional as soon as possible: -allergic reactions like skin  rash, itching or hives, swelling of the face, lips, or tongue -breathing problems -chest pain -depression or memory disorders -pain in your legs or groin -pain at site where injected or implanted -seizures -severe headache -swelling of the feet and legs -suicidal thoughts or other mood changes -visual changes -vomiting Side effects that usually do not require medical attention (report to your doctor or health care professional if they continue or are bothersome): -breast swelling or tenderness -decrease in sex drive or performance -diarrhea -hot flashes -loss of appetite -muscle, joint, or bone pains -nausea -redness or irritation at site where injected or implanted -skin problems or acne This list may not describe all possible side effects. Call your doctor for medical advice about side effects. You may report side effects to FDA at 1-800-FDA-1088. Where should I keep my medicine? This drug is given in a hospital or clinic and will not be stored at home. NOTE: This sheet is a summary. It may not cover all possible information. If you have questions about this medicine, talk to your doctor, pharmacist, or health care provider.  2018 Elsevier/Gold Standard (2015-07-10 09:45:53)  

## 2017-06-01 ENCOUNTER — Ambulatory Visit: Payer: Self-pay

## 2017-06-03 ENCOUNTER — Emergency Department (HOSPITAL_COMMUNITY)
Admission: EM | Admit: 2017-06-03 | Discharge: 2017-06-03 | Disposition: A | Discharge status: Home / Self Care | Payer: Self-pay | Attending: Emergency Medicine | Admitting: Emergency Medicine

## 2017-06-03 ENCOUNTER — Encounter: Payer: Self-pay | Admitting: Oncology

## 2017-06-03 ENCOUNTER — Encounter (HOSPITAL_COMMUNITY): Payer: Self-pay | Admitting: *Deleted

## 2017-06-03 DIAGNOSIS — K922 Gastrointestinal hemorrhage, unspecified: Secondary | ICD-10-CM | POA: Insufficient documentation

## 2017-06-03 DIAGNOSIS — F1210 Cannabis abuse, uncomplicated: Secondary | ICD-10-CM | POA: Insufficient documentation

## 2017-06-03 DIAGNOSIS — R197 Diarrhea, unspecified: Secondary | ICD-10-CM | POA: Insufficient documentation

## 2017-06-03 DIAGNOSIS — F17210 Nicotine dependence, cigarettes, uncomplicated: Secondary | ICD-10-CM | POA: Insufficient documentation

## 2017-06-03 DIAGNOSIS — C7951 Secondary malignant neoplasm of bone: Secondary | ICD-10-CM | POA: Insufficient documentation

## 2017-06-03 DIAGNOSIS — E860 Dehydration: Secondary | ICD-10-CM | POA: Insufficient documentation

## 2017-06-03 DIAGNOSIS — Z8546 Personal history of malignant neoplasm of prostate: Secondary | ICD-10-CM | POA: Insufficient documentation

## 2017-06-03 DIAGNOSIS — N390 Urinary tract infection, site not specified: Secondary | ICD-10-CM | POA: Insufficient documentation

## 2017-06-03 LAB — COMPREHENSIVE METABOLIC PANEL
ALBUMIN: 3.3 g/dL — AB (ref 3.5–5.0)
ALK PHOS: 62 U/L (ref 38–126)
ALT: 8 U/L — AB (ref 17–63)
AST: 15 U/L (ref 15–41)
Anion gap: 10 (ref 5–15)
BUN: 8 mg/dL (ref 6–20)
CALCIUM: 8.8 mg/dL — AB (ref 8.9–10.3)
CO2: 23 mmol/L (ref 22–32)
CREATININE: 1.02 mg/dL (ref 0.61–1.24)
Chloride: 106 mmol/L (ref 101–111)
GFR calc Af Amer: >60 mL/min (ref >60)
GFR calc non Af Amer: >60 mL/min (ref >60)
GLUCOSE: 95 mg/dL (ref 65–99)
Potassium: 3.5 mmol/L (ref 3.5–5.1)
SODIUM: 139 mmol/L (ref 135–145)
Total Bilirubin: 1.1 mg/dL (ref 0.3–1.2)
Total Protein: 6.9 g/dL (ref 6.5–8.1)

## 2017-06-03 LAB — APTT: aPTT: 26 seconds (ref 24–36)

## 2017-06-03 LAB — MAGNESIUM: Magnesium: 2 mg/dL (ref 1.7–2.4)

## 2017-06-03 LAB — CBC WITH DIFFERENTIAL/PLATELET
BASOS ABS: 0 10*3/uL (ref 0.0–0.1)
BASOS PCT: 0 %
EOS ABS: 0.1 10*3/uL (ref 0.0–0.7)
EOS PCT: 1 %
HCT: 38.8 % — ABNORMAL LOW (ref 39.0–52.0)
HEMOGLOBIN: 13 g/dL (ref 13.0–17.0)
LYMPHS ABS: 1.3 10*3/uL (ref 0.7–4.0)
Lymphocytes Relative: 23 %
MCH: 31.6 pg (ref 26.0–34.0)
MCHC: 33.5 g/dL (ref 30.0–36.0)
MCV: 94.2 fL (ref 78.0–100.0)
Monocytes Absolute: 0.5 10*3/uL (ref 0.1–1.0)
Monocytes Relative: 9 %
NEUTROS PCT: 67 %
Neutro Abs: 3.7 10*3/uL (ref 1.7–7.7)
PLATELETS: 335 10*3/uL (ref 150–400)
RBC: 4.12 MIL/uL — AB (ref 4.22–5.81)
RDW: 12.6 % (ref 11.5–15.5)
WBC: 5.6 10*3/uL (ref 4.0–10.5)

## 2017-06-03 LAB — URINALYSIS, ROUTINE W REFLEX MICROSCOPIC
BACTERIA UA: NONE SEEN
BILIRUBIN URINE: NEGATIVE
GLUCOSE, UA: NEGATIVE mg/dL
KETONES UR: NEGATIVE mg/dL
NITRITE: NEGATIVE
PH: 5 (ref 5.0–8.0)
Protein, ur: >=300 mg/dL — AB
Specific Gravity, Urine: 1.027 (ref 1.005–1.030)

## 2017-06-03 LAB — TYPE AND SCREEN
ABO/RH(D): AB POS
Antibody Screen: NEGATIVE

## 2017-06-03 LAB — POC OCCULT BLOOD, ED: FECAL OCCULT BLD: POSITIVE — AB

## 2017-06-03 LAB — ABO/RH: ABO/RH(D): AB POS

## 2017-06-03 LAB — PROTIME-INR
INR: 1.02
Prothrombin Time: 13.3 seconds (ref 11.4–15.2)

## 2017-06-03 MED ORDER — CEPHALEXIN 500 MG PO CAPS
500.0000 mg | ORAL_CAPSULE | Freq: Once | ORAL | Status: AC
  Administered 2017-06-03: 500 mg via ORAL
  Filled 2017-06-03: qty 1

## 2017-06-03 MED ORDER — CEPHALEXIN 500 MG PO CAPS: 500.0000 mg | ORAL_CAPSULE | Freq: Three times a day (TID) | ORAL | 0 refills | Status: DC

## 2017-06-03 MED ORDER — SODIUM CHLORIDE 0.9 % IV BOLUS
1000.0000 mL | Freq: Once | INTRAVENOUS | Status: AC
  Administered 2017-06-03: 1000 mL via INTRAVENOUS

## 2017-06-03 NOTE — ED Provider Notes (Signed)
[facility] DEPT Provider Note   CSN: [id number] Arrival date & time: 06/03/17  1144     History   Chief Complaint Chief Complaint  Patient presents with  . Diarrhea    HPI Anthony Villanueva is a 62 y.o. male.  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  Has had 3 weeks of constipation up until 4/17 when he states his stool became soft and has had persistent diarrhea since.  States he has been incontinent of stool and has to wear a diaper.  He also has noted red blood streaked into the stool.  He denies any abdominal pain.  No fever or chills.  Endorses generalized weakness.  Patient states he been taking BC powders for his chronic left hip pain this is unchanged. Past Medical History:  Diagnosis Date  . Childhood asthma     Patient Active Problem List   Diagnosis Date Noted  . Prostate cancer ([place]) 05/19/2017  . Pelvic mass 05/10/2017  . Constipation 05/10/2017  . Weight loss, unintentional 05/10/2017  . Hematuria 05/10/2017    Past Surgical History:  Procedure Laterality Date  . PROSTATE BIOPSY  05/11/2017   CT core bx L retroperitoneal LAN Archie Endo 05/11/2017        Home Medications    Prior to Admission medications   Medication Sig Start Date End Date Taking? Authorizing Provider  acetaminophen (TYLENOL) 500 MG tablet Take 1 tablet (500 mg total) by mouth every 6 (six) hours as needed for mild pain. 05/25/17  Yes Dorena Dew, FNP  Aspirin-Salicylamide-Caffeine (ARTHRITIS STRENGTH BC POWDER PO) Take 1 Package by mouth as needed (hip pain).   Yes [provider]  bicalutamide (CASODEX) 50 MG tablet Take 1 tablet (50 mg total) by mouth daily. Patient not taking: Reported on 05/25/2017 05/19/17   Wyatt Portela, MD  cephALEXin (KEFLEX) 500 MG capsule Take 1 capsule (500 mg total) by mouth 3 (three) times daily. 06/03/17   Julianne Rice, MD  docusate sodium  (COLACE) 100 MG capsule Take 1 capsule (100 mg total) by mouth 2 (two) times daily. Patient not taking: Reported on 05/25/2017 05/18/17   Glyn Ade, PA-C  oxyCODONE-acetaminophen (PERCOCET/ROXICET) 5-325 MG tablet Take 1 tablet by mouth every 4 (four) hours as needed for severe pain. Patient not taking: Reported on 05/25/2017 05/18/17   Glyn Ade, PA-C  traMADol (ULTRAM) 50 MG tablet Take 1 tablet (50 mg total) by mouth every 6 (six) hours as needed. Patient not taking: Reported on 06/03/2017 05/25/17   Dorena Dew, FNP    Family History No family history on file.  Social History Social History   Tobacco Use  . Smoking status: Current Every Day Smoker    Packs/day: 0.25    Years: 43.00    Pack years: 10.75    Types: Cigarettes  . Smokeless tobacco: Never Used  Substance Use Topics  . Alcohol use: Yes    Alcohol/week: 13.8 oz    Types: 23 Cans of beer per week    Comment: 05/11/2017 "40oz beer/day"  . Drug use: Yes    Types: Marijuana    Comment: 05/11/2017 "daily"     Allergies   Patient has no known allergies.   Review of Systems Review of Systems  Constitutional: Positive for appetite change and fatigue. Negative for chills and fever.  Respiratory: Negative for cough and shortness of breath.   Cardiovascular: Negative for chest pain.  Gastrointestinal: Positive for blood in stool and diarrhea. Negative for abdominal pain, nausea and vomiting.  Genitourinary: Negative for difficulty urinating, dysuria and flank pain.  Musculoskeletal: Positive for arthralgias. Negative for back pain, myalgias, neck pain and neck stiffness.  Skin: Negative for rash and wound.  Neurological: Positive for weakness. Negative for dizziness, light-headedness, numbness and headaches.  All other systems reviewed and are negative.    Physical Exam Updated Vital Signs BP (!) 156/98   Pulse 90   Temp 98.1 F (36.7 C) (Oral)   Resp 17   Ht 6\' 1"  (1.854 m)   Wt 77.1 kg (170  lb)   SpO2 100%   BMI 22.43 kg/m   Physical Exam  Constitutional: He is oriented to person, place, and time. He appears well-developed and well-nourished. No distress.  HENT:  Head: Normocephalic and atraumatic.  Mouth/Throat: Oropharynx is clear and moist.  Eyes: Pupils are equal, round, and reactive to light. EOM are normal.  Neck: Normal range of motion. Neck supple.  Cardiovascular: Normal rate and regular rhythm.  Pulmonary/Chest: Effort normal and breath sounds normal.  Abdominal: Soft. Bowel sounds are normal. There is no tenderness. There is no rebound and no guarding.  Genitourinary: Rectal exam shows guaiac positive stool.  Genitourinary Comments: Good rectal tone.  Large firm prostate.  Very small amount of gross blood on exam.  Musculoskeletal: Normal range of motion. He exhibits no edema or tenderness.  Neurological: He is alert and oriented to person, place, and time.  Moves all extremities without focal deficit.  Sensation intact.  No saddle anesthesia  Skin: Skin is warm and dry. No rash noted. He is not diaphoretic. No erythema.  Psychiatric: He has a normal mood and affect. His behavior is normal.  Nursing note and vitals reviewed.    ED Treatments / Results  Labs (all labs ordered are listed, but only abnormal results are displayed) Labs Reviewed  CBC WITH DIFFERENTIAL/PLATELET - Abnormal; Notable for the following components:      Result Value   RBC 4.12 (*)    HCT 38.8 (*)    All other components within normal limits  COMPREHENSIVE METABOLIC PANEL - Abnormal; Notable for the following components:   Calcium 8.8 (*)    Albumin 3.3 (*)    ALT 8 (*)    All other components within normal limits  URINALYSIS, ROUTINE W REFLEX MICROSCOPIC - Abnormal; Notable for the following components:   Color, Urine AMBER (*)    APPearance CLOUDY (*)    Hgb urine dipstick LARGE (*)    Protein, ur >=300 (*)    Leukocytes, UA MODERATE (*)    RBC / HPF >50 (*)    WBC, UA  >50 (*)    Non Squamous Epithelial 0-5 (*)    All other components within normal limits  POC OCCULT BLOOD, ED - Abnormal; Notable for the following components:   Fecal Occult Bld POSITIVE (*)    All other components within normal limits  C DIFFICILE QUICK SCREEN W PCR REFLEX  URINE CULTURE  PROTIME-INR  MAGNESIUM  APTT  TYPE AND SCREEN  ABO/RH    EKG None  Radiology No results found.  Procedures Procedures (including critical care time)  Medications Ordered in ED Medications  cephALEXin (KEFLEX) capsule 500 mg (has no administration in time range)  sodium chloride 0.9 % bolus 1,000 mL (0 mLs Intravenous Stopped 06/03/17 1516)     Initial Impression / Assessment and Plan / ED Course  I have  reviewed the triage vital signs and the nursing notes.  Pertinent labs & imaging results that were available during my care of the patient were reviewed by me and considered in my medical decision making (see chart for details).    No diarrhea or bleeding in the emergency department.  Initial tachycardia is improved with IV fluids.  Hemoglobin is stable.  Discussed with Dr. Watt Climes.  Recommends follow-up in the office next week.  Patient also appears to have urinary tract infection.  Urine sent for culture.  Will start on Keflex.  Instructed to follow-up closely with his primary physician.  Return precautions given.   Final Clinical Impressions(s) / ED Diagnoses   Final diagnoses:  Diarrhea, unspecified type  Lower GI bleeding  Dehydration  Acute lower UTI    ED Discharge Orders        Ordered    cephALEXin (KEFLEX) 500 MG capsule  3 times daily     06/03/17 1529       Julianne Rice, MD 06/03/17 1531

## 2017-06-03 NOTE — Progress Notes (Signed)
Patient came in to sign paperwork for [facility] and Prostate grants. Patient states his daughter emailed his proof of income. I have not received but approved patient for grants to get his medication and he will get to me later. Patient has a copy of the approval as well as the expense sheet it covers along with the outpatient pharmacy information.  Patient states he is having some symptoms that he is going to ED for. Called [facility] RN to advise and she confirmed that he needs to go to ED for those symptoms. Patient asked if he received medications over there if he can use the grant to cover. Advised him the grant only covers medications written by our physicians. He verbalized understanding. He has my card for any additional financial questions or concerns.

## 2017-06-03 NOTE — ED Notes (Signed)
ED Provider at bedside. 

## 2017-06-03 NOTE — ED Triage Notes (Addendum)
Pt complains of diarrhea for the past week. Pt states he noticed blood in his stool. Pt states he has been taking BC powder for hip pain, pt denies any other changes in his medication

## 2017-06-04 LAB — URINE CULTURE

## 2017-06-05 ENCOUNTER — Telehealth: Payer: Self-pay

## 2017-06-05 NOTE — Telephone Encounter (Signed)
No action necessary for [facility] ED 06/03/17 per [place] D

## 2017-06-09 ENCOUNTER — Encounter (HOSPITAL_COMMUNITY)
Admission: RE | Admit: 2017-06-09 | Discharge: 2017-06-09 | Disposition: A | Discharge status: Home / Self Care | Payer: Self-pay | Source: Ambulatory Visit | Attending: Oncology | Admitting: Oncology

## 2017-06-09 DIAGNOSIS — C61 Malignant neoplasm of prostate: Secondary | ICD-10-CM | POA: Insufficient documentation

## 2017-06-09 MED ORDER — TECHNETIUM TC 99M MEDRONATE IV KIT
19.0000 | PACK | Freq: Once | INTRAVENOUS | Status: AC | PRN
  Administered 2017-06-09: 19 via INTRAVENOUS

## 2017-06-15 ENCOUNTER — Encounter: Payer: Self-pay | Admitting: Pharmacy Technician

## 2017-06-15 NOTE — Progress Notes (Signed)
The patient is approved for drug assistance by [employer] for Lupron 30 mg with enrollment 06/10/17 - 06/11/18. Enrollment is based on self-pay. Drug replacement will begin DOS 05/31/17.

## 2017-06-17 ENCOUNTER — Ambulatory Visit (INDEPENDENT_AMBULATORY_CARE_PROVIDER_SITE_OTHER): Payer: Self-pay | Admitting: Family Medicine

## 2017-06-17 ENCOUNTER — Encounter: Payer: Self-pay | Admitting: Family Medicine

## 2017-06-17 VITALS — BP 138/80 | HR 100 | Temp 98.8°F | Resp 16 | Ht 73.0 in | Wt 165.0 lb

## 2017-06-17 DIAGNOSIS — R197 Diarrhea, unspecified: Secondary | ICD-10-CM

## 2017-06-17 DIAGNOSIS — R82998 Other abnormal findings in urine: Secondary | ICD-10-CM

## 2017-06-17 DIAGNOSIS — C61 Malignant neoplasm of prostate: Secondary | ICD-10-CM

## 2017-06-17 LAB — POCT URINALYSIS DIPSTICK
BILIRUBIN UA: NEGATIVE
GLUCOSE UA: NEGATIVE
KETONES UA: NEGATIVE
Nitrite, UA: NEGATIVE
PH UA: 5.5 (ref 5.0–8.0)
Protein, UA: >=300
UROBILINOGEN UA: 0.2 U/dL

## 2017-06-17 MED ORDER — LOPERAMIDE HCL 2 MG PO TABS: 2.0000 mg | ORAL_TABLET | Freq: Three times a day (TID) | ORAL | 0 refills | Status: AC | PRN

## 2017-06-17 NOTE — Progress Notes (Signed)
Subjective:    Patient ID: Anthony Villanueva, male    DOB: 16-Aug-1955, 62 y.o.   MRN: [id number]   Anthony Villanueva, a 62 year old male with a medical history significant for new diagnosis of prostate cancer presents complaining of diarrhea over the past 2 weeks.Patient was evaluated in the ER on 06/03/2017, but was unable to pick up medications due to financial and transportation constraints.  Patient is currently undergoing treatment for prostate cancer. Previous CT scan showed large pelvic mass and PSA was greater than 200. Patient has undergone a core needle biopsy of the left retroperitoneal lymph node and is scheduled to follow up with Dr. Zola Villanueva on 07/10/2017.  Patient continues to endorse fatigue and weakness.   Diarrhea   The problem has been rapidly worsening. The stool consistency is described as watery. The patient states that diarrhea does not awaken him from sleep. Associated symptoms include arthralgias (right hip pain) and weight loss. Pertinent negatives include no abdominal pain, bloating, chills, coughing, fever, headaches, increased  flatus, myalgias, sweats, URI or vomiting. Exacerbated by: chemotherapy.   Current Outpatient Medications on File Prior to Visit  Medication Sig Dispense Refill  . acetaminophen (TYLENOL) 500 MG tablet Take 1 tablet (500 mg total) by mouth every 6 (six) hours as needed for mild pain. (Patient not taking: Reported on 06/17/2017) 30 tablet 1  . Aspirin-Salicylamide-Caffeine (ARTHRITIS STRENGTH BC POWDER PO) Take 1 Package by mouth as needed (hip pain).    . bicalutamide (CASODEX) 50 MG tablet Take 1 tablet (50 mg total) by mouth daily. (Patient not taking: Reported on 05/25/2017) 30 tablet 0  . cephALEXin (KEFLEX) 500 MG capsule Take 1 capsule (500 mg total) by mouth 3 (three) times daily. (Patient not taking: Reported on 06/17/2017) 30 capsule 0  . docusate sodium (COLACE) 100 MG capsule Take 1 capsule (100 mg total) by mouth 2 (two) times daily. (Patient  not taking: Reported on 05/25/2017) 60 capsule 0  . oxyCODONE-acetaminophen (PERCOCET/ROXICET) 5-325 MG tablet Take 1 tablet by mouth every 4 (four) hours as needed for severe pain. (Patient not taking: Reported on 05/25/2017) 10 tablet 0  . traMADol (ULTRAM) 50 MG tablet Take 1 tablet (50 mg total) by mouth every 6 (six) hours as needed. (Patient not taking: Reported on 06/03/2017) 15 tablet 0   No current facility-administered medications on file prior to visit.    Past Medical History:  Diagnosis Date  . Childhood asthma    Immunization History  Administered Date(s) Administered  . Pneumococcal Polysaccharide-23 05/25/2017  . Tdap 05/25/2017   Social History   Socioeconomic History  . Marital status: Significant Other    Spouse name: Not on file  . Number of children: Not on file  . Years of education: Not on file  . Highest education level: Not on file  Occupational History  . Not on file  Social Needs  . Financial resource strain: Not on file  . Food insecurity:    Worry: Not on file    Inability: Not on file  . Transportation needs:    Medical: Not on file    Non-medical: Not on file  Tobacco Use  . Smoking status: Current Every Day Smoker    Packs/day: 0.25    Years: 43.00    Pack years: 10.75    Types: Cigarettes  . Smokeless tobacco: Never Used  Substance and Sexual Activity  . Alcohol use: Yes    Alcohol/week: 13.8 oz    Types: 23 Cans of  beer per week    Comment: 05/11/2017 "40oz beer/day"  . Drug use: Yes    Types: Marijuana    Comment: 05/11/2017 "daily"  . Sexual activity: Not Currently  Lifestyle  . Physical activity:    Days per week: Not on file    Minutes per session: Not on file  . Stress: Not on file  Relationships  . Social connections:    Talks on phone: Not on file    Gets together: Not on file    Attends religious service: Not on file    Active member of club or organization: Not on file    Attends meetings of clubs or organizations: Not on  file    Relationship status: Not on file  . Intimate partner violence:    Fear of current or ex partner: Not on file    Emotionally abused: Not on file    Physically abused: Not on file    Forced sexual activity: Not on file  Other Topics Concern  . Not on file  Social History Narrative  . Not on file    Review of Systems  Constitutional: Positive for fatigue and weight loss. Negative for chills and fever.  HENT: Negative.   Respiratory: Negative for cough.   Cardiovascular: Negative.   Gastrointestinal: Positive for diarrhea. Negative for abdominal pain, bloating, flatus and vomiting.  Endocrine: Negative for polydipsia, polyphagia and polyuria.  Genitourinary: Positive for hematuria.  Musculoskeletal: Positive for arthralgias (right hip pain). Negative for myalgias.  Skin: Negative.   Allergic/Immunologic: Negative.   Neurological: Negative.  Negative for headaches.  Psychiatric/Behavioral: Negative.        Objective:   Physical Exam  Constitutional: He is oriented to person, place, and time.  Cardiovascular: Normal rate, normal heart sounds and intact distal pulses.  Pulmonary/Chest: Effort normal and breath sounds normal.  Abdominal: Soft. Bowel sounds are normal. There is generalized tenderness.  Musculoskeletal:       Right hip: He exhibits decreased range of motion and decreased strength.  Neurological: He is alert and oriented to person, place, and time. He has normal reflexes.  Skin: Skin is warm and dry.  Psychiatric: He has a normal mood and affect. His behavior is normal. Judgment and thought content normal.      BP 138/80 (BP Location: Left Arm, Patient Position: Sitting, Cuff Size: Normal)   Pulse 100   Temp 98.8 F (37.1 C) (Oral)   Resp 16   Ht 6\' 1"  (1.854 m)   Wt 165 lb (74.8 kg)   SpO2 100%   BMI 21.77 kg/m  Assessment & Plan:   Diarrhea, unspecified type - Urinalysis Dipstick - loperamide (IMODIUM A-D) 2 MG tablet; Take 1 tablet (2 mg total)  by mouth 3 (three) times daily as needed for up to 7 days for diarrhea or loose stools.  Dispense: 30 tablet; Refill: 0 - Comprehensive metabolic panel - CBC with Differential  Urine leukocytes - Urine Culture  Prostate cancer Anthony Villanueva) Follow up with Dr. Alen Blew on 07/10/2017   Donia Pounds  MSN, FNP-C Patient [facility] [address] [place], [place] [address] [phone]

## 2017-06-17 NOTE — Patient Instructions (Signed)
Recommend OTC probiotic (Florastor, Align)  Will stat a trial of Imodium three times per day as needed for diarrhea  Diarrhea, Adult Diarrhea is when you have loose and water poop (stool) often. Diarrhea can make you feel weak and cause you to get dehydrated. Dehydration can make you tired and thirsty, make you have a dry mouth, and make it so you pee (urinate) less often. Diarrhea often lasts 2-3 days. However, it can last longer if it is a sign of something more serious. It is important to treat your diarrhea as told by your doctor. Follow these instructions at home: Eating and drinking  Follow these recommendations as told by your doctor:  Take an oral rehydration solution (ORS). This is a drink that is sold at pharmacies and stores.  Drink clear fluids, such as: ? Water. ? Ice chips. ? Diluted fruit juice. ? Low-calorie sports drinks.  Eat bland, easy-to-digest foods in small amounts as you are able. These foods include: ? Bananas. ? Applesauce. ? Rice. ? Low-fat (lean) meats. ? Toast. ? Crackers.  Avoid drinking fluids that have a lot of sugar or caffeine in them.  Avoid alcohol.  Avoid spicy or fatty foods.  General instructions   Drink enough fluid to keep your pee (urine) clear or pale yellow.  Wash your hands often. If you cannot use soap and water, use hand sanitizer.  Make sure that all people in your home wash their hands well and often.  Take over-the-counter and prescription medicines only as told by your doctor.  Rest at home while you get better.  Watch your condition for any changes.  Take a warm bath to help with any burning or pain from having diarrhea.  Keep all follow-up visits as told by your doctor. This is important. Contact a doctor if:  You have a fever.  Your diarrhea gets worse.  You have new symptoms.  You cannot keep fluids down.  You feel light-headed or dizzy.  You have a headache.  You have muscle cramps. Get help  right away if:  You have chest pain.  You feel very weak or you pass out (faint).  You have bloody or black poop or poop that look like tar.  You have very bad pain, cramping, or bloating in your belly (abdomen).  You have trouble breathing or you are breathing very quickly.  Your heart is beating very quickly.  Your skin feels cold and clammy.  You feel confused.  You have signs of dehydration, such as: ? Dark pee, hardly any pee, or no pee. ? Cracked lips. ? Dry mouth. ? Sunken eyes. ? Sleepiness. ? Weakness. This information is not intended to replace advice given to you by your health care provider. Make sure you discuss any questions you have with your health care provider. Document Released: 07/14/2007 Document Revised: 08/15/2015 Document Reviewed: 10/01/2014 Elsevier Interactive Patient Education  2018 [place] Choices to Help Relieve Diarrhea, Adult When you have diarrhea, the foods you eat and your eating habits are very important. Choosing the right foods and drinks can help:  Relieve diarrhea.  Replace lost fluids and nutrients.  Prevent dehydration.  What general guidelines should I follow? Relieving diarrhea  Choose foods with less than 2 g or .07 oz. of fiber per serving.  Limit fats to less than 8 tsp (38 g or 1.34 oz.) a day.  Avoid the following: ? Foods and beverages sweetened with high-fructose corn syrup, honey, or sugar alcohols such as  xylitol, sorbitol, and mannitol. ? Foods that contain a lot of fat or sugar. ? Fried, greasy, or spicy foods. ? High-fiber grains, breads, and cereals. ? Raw fruits and vegetables.  Eat foods that are rich in probiotics. These foods include dairy products such as yogurt and fermented milk products. They help increase healthy bacteria in the stomach and intestines (gastrointestinal tract, or GI tract).  If you have lactose intolerance, avoid dairy products. These may make your diarrhea worse.  Take  medicine to help stop diarrhea (antidiarrheal medicine) only as told by your health care provider. Replacing nutrients  Eat small meals or snacks every 3-4 hours.  Eat bland foods, such as white rice, toast, or baked potato, until your diarrhea starts to get better. Gradually reintroduce nutrient-rich foods as tolerated or as told by your health care provider. This includes: ? Well-cooked protein foods. ? Peeled, seeded, and soft-cooked fruits and vegetables. ? Low-fat dairy products.  Take vitamin and mineral supplements as told by your health care provider. Preventing dehydration   Start by sipping water or a special solution to prevent dehydration (oral rehydration solution, ORS). Urine that is clear or pale yellow means that you are getting enough fluid.  Try to drink at least 8-10 cups of fluid each day to help replace lost fluids.  You may add other liquids in addition to water, such as clear juice or decaffeinated sports drinks, as tolerated or as told by your health care provider.  Avoid drinks with caffeine, such as coffee, tea, or soft drinks.  Avoid alcohol. What foods are recommended? The items listed may not be a complete list. Talk with your health care provider about what dietary choices are best for you. Grains White rice. White, [place], or pita breads (fresh or toasted), including plain rolls, buns, or bagels. White pasta. Saltine, soda, or graham crackers. Pretzels. Low-fiber cereal. Cooked cereals made with water (such as cornmeal, farina, or cream cereals). Plain muffins. Matzo. Melba toast. Zwieback. Vegetables Potatoes (without the skin). Most well-cooked and canned vegetables without skins or seeds. Tender lettuce. Fruits Apple sauce. Fruits canned in juice. Cooked apricots, cherries, grapefruit, peaches, pears, or plums. Fresh bananas and cantaloupe. Meats and other protein foods Baked or boiled chicken. Eggs. Tofu. Fish. Seafood. Smooth nut butters. Ground or  well-cooked tender beef, ham, veal, lamb, pork, or poultry. Dairy Plain yogurt, kefir, and unsweetened liquid yogurt. Lactose-free milk, buttermilk, skim milk, or soy milk. Low-fat or nonfat hard cheese. Beverages Water. Low-calorie sports drinks. Fruit juices without pulp. Strained tomato and vegetable juices. Decaffeinated teas. Sugar-free beverages not sweetened with sugar alcohols. Oral rehydration solutions, if approved by your health care provider. Seasoning and other foods Bouillon, broth, or soups made from recommended foods. What foods are not recommended? The items listed may not be a complete list. Talk with your health care provider about what dietary choices are best for you. Grains Whole grain, whole wheat, bran, or rye breads, rolls, pastas, and crackers. Wild or Summons rice. Whole grain or bran cereals. Barley. Oats and oatmeal. Corn tortillas or taco shells. Granola. Popcorn. Vegetables Raw vegetables. Fried vegetables. Cabbage, broccoli, Brussels sprouts, artichokes, baked beans, beet greens, corn, kale, legumes, peas, sweet potatoes, and yams. Potato skins. Cooked spinach and cabbage. Fruits Dried fruit, including raisins and dates. Raw fruits. Stewed or dried prunes. Canned fruits with syrup. Meat and other protein foods Fried or fatty meats. Deli meats. Chunky nut butters. Nuts and seeds. Beans and lentils. Berniece Salines. Hot dogs. Sausage. Dairy High-fat  cheeses. Whole milk, chocolate milk, and beverages made with milk, such as milk shakes. Half-and-half. Cream. sour cream. Ice cream. Beverages Caffeinated beverages (such as coffee, tea, soda, or energy drinks). Alcoholic beverages. Fruit juices with pulp. Prune juice. Soft drinks sweetened with high-fructose corn syrup or sugar alcohols. High-calorie sports drinks. Fats and oils Butter. Cream sauces. Margarine. Salad oils. Plain salad dressings. Olives. Avocados. Mayonnaise. Sweets and desserts Sweet rolls, doughnuts, and sweet  breads. Sugar-free desserts sweetened with sugar alcohols such as xylitol and sorbitol. Seasoning and other foods Honey. Hot sauce. Chili powder. Gravy. Cream-based or milk-based soups. Pancakes and waffles. Summary  When you have diarrhea, the foods you eat and your eating habits are very important.  Make sure you get at least 8-10 cups of fluid each day, or enough to keep your urine clear or pale yellow.  Eat bland foods and gradually reintroduce healthy, nutrient-rich foods as tolerated, or as told by your health care provider.  Avoid high-fiber, fried, greasy, or spicy foods. This information is not intended to replace advice given to you by your health care provider. Make sure you discuss any questions you have with your health care provider. Document Released: 04/17/2003 Document Revised: 01/23/2016 Document Reviewed: 01/23/2016 Elsevier Interactive Patient Education  [employer].

## 2017-06-18 LAB — CBC WITH DIFFERENTIAL/PLATELET
Basophils Absolute: 0 10*3/uL (ref 0.0–0.2)
Basos: 0 %
EOS (ABSOLUTE): 0.1 10*3/uL (ref 0.0–0.4)
EOS: 2 %
HEMATOCRIT: 38.6 % (ref 37.5–51.0)
Hemoglobin: 12.7 g/dL — ABNORMAL LOW (ref 13.0–17.7)
Immature Grans (Abs): 0 10*3/uL (ref 0.0–0.1)
Immature Granulocytes: 0 %
LYMPHS ABS: 1.3 10*3/uL (ref 0.7–3.1)
Lymphs: 23 %
MCH: 31.5 pg (ref 26.6–33.0)
MCHC: 32.9 g/dL (ref 31.5–35.7)
MCV: 96 fL (ref 79–97)
MONOS ABS: 0.5 10*3/uL (ref 0.1–0.9)
Monocytes: 9 %
NEUTROS PCT: 66 %
Neutrophils Absolute: 3.7 10*3/uL (ref 1.4–7.0)
PLATELETS: 424 10*3/uL — AB (ref 150–379)
RBC: 4.03 x10E6/uL — AB (ref 4.14–5.80)
RDW: 13.3 % (ref 12.3–15.4)
WBC: 5.7 10*3/uL (ref 3.4–10.8)

## 2017-06-18 LAB — COMPREHENSIVE METABOLIC PANEL
A/G RATIO: 1.5 (ref 1.2–2.2)
ALBUMIN: 4.3 g/dL (ref 3.6–4.8)
ALK PHOS: 83 IU/L (ref 39–117)
ALT: 8 IU/L (ref 0–44)
AST: 14 IU/L (ref 0–40)
BILIRUBIN TOTAL: 0.7 mg/dL (ref 0.0–1.2)
BUN / CREAT RATIO: 10 (ref 10–24)
BUN: 8 mg/dL (ref 8–27)
CO2: 24 mmol/L (ref 20–29)
Calcium: 9.7 mg/dL (ref 8.6–10.2)
Chloride: 100 mmol/L (ref 96–106)
Creatinine, Ser: 0.8 mg/dL (ref 0.76–1.27)
GFR calc Af Amer: 112 mL/min/{1.73_m2} (ref >59)
GFR calc non Af Amer: 97 mL/min/{1.73_m2} (ref >59)
GLOBULIN, TOTAL: 2.8 g/dL (ref 1.5–4.5)
Glucose: 81 mg/dL (ref 65–99)
POTASSIUM: 4.6 mmol/L (ref 3.5–5.2)
SODIUM: 137 mmol/L (ref 134–144)
Total Protein: 7.1 g/dL (ref 6.0–8.5)

## 2017-06-19 LAB — URINE CULTURE: Organism ID, Bacteria: NO GROWTH

## 2017-06-20 ENCOUNTER — Telehealth: Payer: Self-pay

## 2017-06-20 NOTE — Telephone Encounter (Signed)
-----   Message from Dorena Dew, [place] sent at 06/20/2017  4:46 AM EDT ----- Regarding: lab results Please inform patient that labs are within normal range. Advise patient to follow up in office as scheduled.  Donia Pounds  MSN, FNP-C [facility] [address] [place],  [phone]

## 2017-06-20 NOTE — Telephone Encounter (Signed)
Called, no answer. Left a message to advised that all labs are within normal range. Asked if any questions to call back to our office. Thanks!

## 2017-07-14 ENCOUNTER — Telehealth: Payer: Self-pay | Admitting: Pharmacist

## 2017-07-14 ENCOUNTER — Inpatient Hospital Stay: Payer: Self-pay

## 2017-07-14 ENCOUNTER — Inpatient Hospital Stay: Payer: Self-pay | Attending: Oncology | Admitting: Oncology

## 2017-07-14 ENCOUNTER — Telehealth: Payer: Self-pay

## 2017-07-14 VITALS — BP 147/87 | HR 95 | Temp 98.7°F | Resp 18 | Ht 73.0 in | Wt 163.9 lb

## 2017-07-14 DIAGNOSIS — C61 Malignant neoplasm of prostate: Secondary | ICD-10-CM | POA: Insufficient documentation

## 2017-07-14 DIAGNOSIS — R319 Hematuria, unspecified: Secondary | ICD-10-CM | POA: Insufficient documentation

## 2017-07-14 DIAGNOSIS — K56609 Unspecified intestinal obstruction, unspecified as to partial versus complete obstruction: Secondary | ICD-10-CM | POA: Insufficient documentation

## 2017-07-14 LAB — CBC WITH DIFFERENTIAL (CANCER CENTER ONLY)
BASOS PCT: 1 %
Basophils Absolute: 0 10*3/uL (ref 0.0–0.1)
EOS ABS: 0.1 10*3/uL (ref 0.0–0.5)
Eosinophils Relative: 1 %
HCT: 39.1 % (ref 38.4–49.9)
Hemoglobin: 13 g/dL (ref 13.0–17.1)
Lymphocytes Relative: 26 %
Lymphs Abs: 1.3 10*3/uL (ref 0.9–3.3)
MCH: 31.4 pg (ref 27.2–33.4)
MCHC: 33.3 g/dL (ref 32.0–36.0)
MCV: 94.3 fL (ref 79.3–98.0)
MONOS PCT: 9 %
Monocytes Absolute: 0.4 10*3/uL (ref 0.1–0.9)
Neutro Abs: 3.2 10*3/uL (ref 1.5–6.5)
Neutrophils Relative %: 63 %
Platelet Count: 296 10*3/uL (ref 140–400)
RBC: 4.14 MIL/uL — ABNORMAL LOW (ref 4.20–5.82)
RDW: 13.1 % (ref 11.0–14.6)
WBC Count: 5.1 10*3/uL (ref 4.0–10.3)

## 2017-07-14 LAB — CMP (CANCER CENTER ONLY)
ALBUMIN: 3.8 g/dL (ref 3.5–5.0)
ALT: 16 U/L (ref 0–55)
ANION GAP: 7 (ref 3–11)
AST: 19 U/L (ref 5–34)
Alkaline Phosphatase: 109 U/L (ref 40–150)
BUN: 14 mg/dL (ref 7–26)
CO2: 26 mmol/L (ref 22–29)
Calcium: 9.3 mg/dL (ref 8.4–10.4)
Chloride: 106 mmol/L (ref 98–109)
Creatinine: 0.89 mg/dL (ref 0.70–1.30)
GFR, Est AFR Am: >60 mL/min (ref >60)
GFR, Estimated: >60 mL/min (ref >60)
GLUCOSE: 106 mg/dL (ref 70–140)
POTASSIUM: 4 mmol/L (ref 3.5–5.1)
Sodium: 139 mmol/L (ref 136–145)
TOTAL PROTEIN: 7.4 g/dL (ref 6.4–8.3)
Total Bilirubin: 1.2 mg/dL (ref 0.2–1.2)

## 2017-07-14 MED ORDER — ENZALUTAMIDE 40 MG PO CAPS: 160.0000 mg | ORAL_CAPSULE | Freq: Every day | ORAL | 0 refills | Status: DC

## 2017-07-14 NOTE — Telephone Encounter (Signed)
Oral Chemotherapy Pharmacist Encounter   I spoke with patient in scheduling for overview of: Xtandi.   Counseled patient on administration, dosing, side effects, monitoring, drug-food interactions, safe handling, storage, and disposal.  Patient will take Xtandi 40mg  capsules, 4 capsules (160mg ) by mouth once daily without regard to food.  Xtandi start date: TBD, pending medication acquisition  Adverse effects include but are not limited to: peripheral edema, GI upset, hypertension, hot flashes, fatigue, falls/fractures, and arthralgias.   Patient instructed about small risk of seizures with Xtandi treatment.  Reviewed with patient importance of keeping a medication schedule and plan for any missed doses.  Anthony Villanueva voiced understanding and appreciation.   All questions answered. Medication reconciliation performed and medication/allergy list updated. Patient states he is not on any other medication, and his medication has been updated to reflect this.  We extensively discussed manufacturer assistance process. We will continue to update patient on this process.  Patient knows to call the office with questions or concerns.  Oral Oncology Clinic will continue to follow.  Thank you,  Johny Drilling, PharmD, BCPS, BCOP  07/14/2017   3:58 PM Oral Oncology Clinic [phone]

## 2017-07-14 NOTE — Progress Notes (Signed)
Hematology and Oncology Follow Up Visit  Anthony Villanueva [id number] [birth year] 62 y.o. 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   Prior Therapy: He is status post CT-guided biopsy of retroperitoneal adenopathy on April 3 of 2019  Current therapy:  Lupron 30 mg every 4 months with the first dose given on May 31, 2017.  Interim History: [name] presents today for a follow-up visit.  He reports feeling better since the last visit without any major complaints.  He is no longer reporting any abdominal pain or discomfort and he has been moving his bowels regularly and actually developed diarrhea.  His diarrhea has improved at this time and his bowel movements are regular.  He received his first Lupron on May 31, 2017 without any complications.  He denies any hot flashes or excessive fatigue.  He denies any bone pain or pathological fractures.  His appetite is excellent and he is gaining weight.   He does not report any headaches, blurry vision, syncope or seizures. Does not report any fevers, chills or sweats.  Does not report any cough, wheezing or hemoptysis.  Does not report any chest pain, palpitation, orthopnea or leg edema.  Does not report any nausea, vomiting or abdominal pain.  Does not report any constipation or diarrhea.  Does not report any skeletal complaints.    Does not report frequency, urgency or hematuria.  Does not report any skin rashes or lesions. Does not report any heat or cold intolerance.  Does not report any lymphadenopathy or petechiae.  Does not report any anxiety or depression.  Remaining review of systems is negative.    Medications: I have reviewed the patient's current medications.  Current Outpatient Medications  Medication Sig Dispense Refill  . acetaminophen (TYLENOL)  500 MG tablet Take 1 tablet (500 mg total) by mouth every 6 (six) hours as needed for mild pain. (Patient not taking: Reported on 06/17/2017) 30 tablet 1  . Aspirin-Salicylamide-Caffeine (ARTHRITIS STRENGTH BC POWDER PO) Take 1 Package by mouth as needed (hip pain).    . bicalutamide (CASODEX) 50 MG tablet Take 1 tablet (50 mg total) by mouth daily. (Patient not taking: Reported on 05/25/2017) 30 tablet 0  . cephALEXin (KEFLEX) 500 MG capsule Take 1 capsule (500 mg total) by mouth 3 (three) times daily. (Patient not taking: Reported on 06/17/2017) 30 capsule 0  . docusate sodium (COLACE) 100 MG capsule Take 1 capsule (100 mg total) by mouth 2 (two) times daily. (Patient not taking: Reported on 05/25/2017) 60 capsule 0  . oxyCODONE-acetaminophen (PERCOCET/ROXICET) 5-325 MG tablet Take 1 tablet by mouth every 4 (four) hours as needed for severe pain. (Patient not taking: Reported on 05/25/2017) 10 tablet 0  . traMADol (ULTRAM) 50 MG tablet Take 1 tablet (50 mg total) by mouth every 6 (six) hours as needed. (Patient not taking: Reported on 06/03/2017) 15 tablet 0   No current facility-administered medications for this visit.      Allergies: No Known Allergies  Past Medical History, Surgical history, Social history, and Family History were reviewed and updated.    Physical Exam: Blood pressure (!) 147/87, pulse 95, temperature 98.7 F (37.1 C), temperature source Oral, resp. rate 18, height 6\' 1"  (1.854 m), weight 163 lb 14.4 oz (74.3 kg), SpO2 100 %.   ECOG: 0 General appearance: alert and cooperative appeared without distress. Head: Normocephalic,  without obvious abnormality Oropharynx: No oral thrush or ulcers. Eyes: No scleral icterus.  Pupils are equal and round reactive to light. Lymph nodes: Cervical, supraclavicular, and axillary nodes normal. Heart:regular rate and rhythm, S1, S2 normal, no murmur, click, rub or gallop Lung:chest clear, no wheezing, rales, normal symmetric air  entry Abdomin: soft, non-tender, without masses or organomegaly. Neurological: No motor, sensory deficits.  Intact deep tendon reflexes. Skin: No rashes or lesions.  No ecchymosis or petechiae. Musculoskeletal: No joint deformity or effusion. Psychiatric: Mood and affect are appropriate.    Lab Results: Lab Results  Component Value Date   WBC 5.7 06/17/2017   HGB 12.7 (L) 06/17/2017   HCT 38.6 06/17/2017   MCV 96 06/17/2017   PLT 424 (H) 06/17/2017     Chemistry      Component Value Date/Time   NA 137 06/17/2017 1223   K 4.6 06/17/2017 1223   CL 100 06/17/2017 1223   CO2 24 06/17/2017 1223   BUN 8 06/17/2017 1223   CREATININE 0.80 06/17/2017 1223      Component Value Date/Time   CALCIUM 9.7 06/17/2017 1223   ALKPHOS 83 06/17/2017 1223   AST 14 06/17/2017 1223   ALT 8 06/17/2017 1223   BILITOT 0.7 06/17/2017 1223     EXAM: NUCLEAR MEDICINE WHOLE BODY BONE SCAN  TECHNIQUE: Whole body anterior and posterior images were obtained approximately 3 hours after intravenous injection of radiopharmaceutical.  RADIOPHARMACEUTICALS:  19.0 mCi Technetium-54m MDP IV  COMPARISON:  CT 05/11/2017, 05/10/2017.  FINDINGS: Bilateral renal function excretion. Focal area of increased activity noted projected over the right anterolateral second rib versus medial aspect of the right scapula. Mild increased activity noted about the medial aspect of the right posterior fourth rib. Punctate area of faint increased activity right posterior tenth rib. Questionable area of increased activity noted over the lateral right aspect of the ninth thoracic vertebral body at the level of the costovertebral junction. Punctate area of increased activity right L2 vertebra. Mild sclerotic changes are noted in the L2 vertebral body on recent CT. Punctate area of increased activity noted over the left proximal femur in the region of the lesser trochanter. No corresponding bony abnormality noted in  this region on recent CT. No focal areas of increased activity noted the region of T12 to account for the mild sclerosis noted on recent CT in this region. No abnormal activity noted in the iliac bones to account for the prior small sclerotic changes in the iliac bones noted on recent CT. Mild increased activity noted over the region of the left first carpometacarpal joint most likely degenerative.  IMPRESSION: Small punctate areas of increased activity noted over the ribs, thoracic spine, lumbar spine, and proximal left femur as discussed above. These could represent metastatic foci.  Impression and Plan:  62 year old man with the following:  1.    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  He has documented disease with a pelvic mass and retroperitoneal adenopathy and questionable small bony metastasis.  He received Lupron in April 2019 without any major complications.  Risks and benefits of adding additional therapy to androgen deprivation was reviewed today.  The rationale behind adding these agents have been documented which include improvement in overall survival and progression free survival.  The options would include systemic chemotherapy, Zytiga, Xtandi and apalutamide.  After discussion today and weighing the risks and benefits of all these agents, Xtandi would be a reasonable option at this time.  Complication associated with  this medication include nausea, fatigue, hematuria, hypertension and rarely seizures.  After discussion today, he is agreeable to proceed at this time.  He will receive a dose of 160 mg daily.  2.  Hematuria and lower urinary tract symptoms: Have resolved at this time after treating his systemic disease.  Radiation therapy to the pelvis may be an option in the future if he develops any complaints.  3.  Colonic obstruction: Resolved after treating him with Lupron.   4.  Pain: No pain issues related to his cancer reported at  this time.  5.  Bone directed therapy: He has limited bone disease and bone directed therapy will be a consideration.  6.  Prognosis: He has incurable malignancy but disease that can be palliated effectively at this time.  His performance status is excellent and aggressive therapy is warranted.  7.  Follow-up: We will be in 6 weeks to follow his progress.  25  minutes was spent with the patient face-to-face today.  More than 50% of time was dedicated to patient counseling, education and coordination of his care.     Zola Button, MD 6/6/20191:38 PM

## 2017-07-14 NOTE — Telephone Encounter (Signed)
Oral Oncology Pharmacist Encounter  Received new prescription for Xtandi (enzalutamide) for the treatment of advanced, castration-sensitive prostate cancer in conjunction with Lupron injections, planned duration until disease progression or unacceptable toxicity.  Labs from 07/14/17 assessed, OK for treatment.  Current medication list in Epic reviewed, no interactions with Xtandi identified. Patient states he is not on any medications listed on his med list. They have all been removed from his list.  Patient noted without prescription medication coverage. We will assist patient in enrolling for manufacturer assistance. Application will be updated in a separate encounter.  Oral Oncology Clinic will continue to follow for initial counseling and start date.  Johny Drilling, PharmD, BCPS, BCOP  07/14/2017 3:49 PM Oral Oncology Clinic [phone]

## 2017-07-14 NOTE — Telephone Encounter (Signed)
Printed avs an calender of upcoming appointment. Per 6/6 los

## 2017-07-15 LAB — PROSTATE-SPECIFIC AG, SERUM (LABCORP): Prostate Specific Ag, Serum: 6.1 ng/mL — ABNORMAL HIGH (ref 0.0–4.0)

## 2017-07-19 ENCOUNTER — Telehealth: Payer: Self-pay | Admitting: Pharmacy Technician

## 2017-07-19 NOTE — Telephone Encounter (Signed)
Oral Oncology Patient Advocate Encounter  Patient noted to be uninsured for prescription drugs.   Completed an application for [employer] during his office visit in an effort to reduce the patient's out of pocket expense for Xtandi to $0.    Application completed and faxed to [phone].   [facility] phone number for follow up is [phone].   This encounter will be updated until final determination.  Anthony Villanueva. [name], [place] Patient [place] [phone] 07/19/2017 9:00 AM

## 2017-07-19 NOTE — Telephone Encounter (Signed)
Oral Oncology Patient Advocate Encounter  Received notification from [facility] Patient Assistance program that patient has been successfully enrolled into their program to receive Xtandi from the manufacturer at $0 out of pocket until 07/19/2018.   I called and left a voicemail for the patient with the details of his enrollment.    Gilmore Laroche, CPhT, [place] Oral Oncology Patient Advocate [phone] 07/19/2017 9:15 AM

## 2017-07-26 NOTE — Telephone Encounter (Signed)
Oral Oncology Patient Advocate Encounter  Received a call from [employer] that the dispensing pharmacy ([facility]) has been having difficulty reaching the patient to arrange for shipment of his Xtandi.    I attempted to reach the patient and was forwarded to his voicemail box.  The voicemail box was full so I was unable to leave a message.    Should the patient contact the office, he can reach [facility] directly at [phone] - option #2.    Fabio Asa. [name], [place] Patient [place] [phone] 07/26/2017 12:31 PM

## 2017-08-09 ENCOUNTER — Telehealth: Payer: Self-pay | Admitting: Pharmacist

## 2017-08-09 NOTE — Telephone Encounter (Signed)
Oral Oncology Pharmacist Encounter  Received notification from [place] support solutions that patient assistance pharmacy, [place], had not been able to schedule delivery of patient's first fill of Xtandi with patient. Patient had left a message with oral oncology clinic with updated contact information. Best contact phone number for patient: [phone]  I called patient on above number and was able to speak with him about his Xtandi prescription. Patient instructed to call [facility] at [phone] to schedule his first shipment of Xtandi. Patient stated he would call pharmacy today.  Office visits for 08/23/2017 confirmed with patient. Patient knows to call the office with any additional questions or concerns before then.  Patient expressed understanding and appreciation.  Johny Drilling, PharmD, BCPS, BCOP  08/09/2017 11:06 AM Oral Oncology Clinic [phone]

## 2017-08-23 ENCOUNTER — Inpatient Hospital Stay: Payer: Self-pay

## 2017-08-23 ENCOUNTER — Inpatient Hospital Stay: Payer: Self-pay | Attending: Oncology | Admitting: Oncology

## 2017-08-26 ENCOUNTER — Ambulatory Visit: Payer: Self-pay | Admitting: Family Medicine

## 2017-09-05 ENCOUNTER — Other Ambulatory Visit: Payer: Self-pay | Admitting: *Deleted

## 2017-09-05 MED ORDER — ENZALUTAMIDE 40 MG PO CAPS: 160.0000 mg | ORAL_CAPSULE | Freq: Every day | ORAL | 0 refills | Status: DC

## 2017-09-29 ENCOUNTER — Inpatient Hospital Stay: Payer: Self-pay

## 2017-09-29 ENCOUNTER — Inpatient Hospital Stay: Payer: Self-pay | Attending: Oncology | Admitting: Oncology

## 2017-09-29 VITALS — BP 144/83 | HR 100 | Temp 98.5°F | Resp 18 | Ht 73.0 in | Wt 156.0 lb

## 2017-09-29 DIAGNOSIS — C61 Malignant neoplasm of prostate: Secondary | ICD-10-CM | POA: Insufficient documentation

## 2017-09-29 DIAGNOSIS — Z5111 Encounter for antineoplastic chemotherapy: Secondary | ICD-10-CM | POA: Insufficient documentation

## 2017-09-29 LAB — CBC WITH DIFFERENTIAL (CANCER CENTER ONLY)
BASOS ABS: 0 10*3/uL (ref 0.0–0.1)
BASOS PCT: 1 %
Eosinophils Absolute: 0.1 10*3/uL (ref 0.0–0.5)
Eosinophils Relative: 1 %
HEMATOCRIT: 40.7 % (ref 38.4–49.9)
HEMOGLOBIN: 13.8 g/dL (ref 13.0–17.1)
LYMPHS PCT: 20 %
Lymphs Abs: 1.1 10*3/uL (ref 0.9–3.3)
MCH: 32.3 pg (ref 27.2–33.4)
MCHC: 33.9 g/dL (ref 32.0–36.0)
MCV: 95.3 fL (ref 79.3–98.0)
MONO ABS: 0.4 10*3/uL (ref 0.1–0.9)
Monocytes Relative: 8 %
NEUTROS ABS: 3.9 10*3/uL (ref 1.5–6.5)
NEUTROS PCT: 70 %
PLATELETS: 366 10*3/uL (ref 140–400)
RBC: 4.27 MIL/uL (ref 4.20–5.82)
RDW: 13.5 % (ref 11.0–14.6)
WBC: 5.5 10*3/uL (ref 4.0–10.3)

## 2017-09-29 LAB — CMP (CANCER CENTER ONLY)
ALK PHOS: 81 U/L (ref 38–126)
ALT: 8 U/L (ref 0–44)
ANION GAP: 9 (ref 5–15)
AST: 11 U/L — ABNORMAL LOW (ref 15–41)
Albumin: 3.6 g/dL (ref 3.5–5.0)
BUN: 12 mg/dL (ref 6–20)
CALCIUM: 9.9 mg/dL (ref 8.9–10.3)
CHLORIDE: 110 mmol/L (ref 98–111)
CO2: 24 mmol/L (ref 22–32)
Creatinine: 0.79 mg/dL (ref 0.61–1.24)
GFR, Estimated: >60 mL/min (ref >60)
Glucose, Bld: 95 mg/dL (ref 70–99)
Potassium: 4.2 mmol/L (ref 3.5–5.1)
Sodium: 143 mmol/L (ref 135–145)
Total Bilirubin: 0.5 mg/dL (ref 0.3–1.2)
Total Protein: 7.6 g/dL (ref 6.5–8.1)

## 2017-09-29 MED ORDER — LEUPROLIDE ACETATE (4 MONTH) 30 MG IM KIT
30.0000 mg | PACK | Freq: Once | INTRAMUSCULAR | Status: AC
  Administered 2017-09-29: 30 mg via INTRAMUSCULAR
  Filled 2017-09-29: qty 30

## 2017-09-29 NOTE — Progress Notes (Signed)
Hematology and Oncology Follow Up Visit  Anthony Villanueva [id number] 1955-03-17 62 y.o. 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  He was found to have pelvic adenopathy and PSA of 293.     Prior Therapy: He is status post CT-guided biopsy of retroperitoneal adenopathy on April 3 of 2019  Current therapy:  Lupron 30 mg every 4 months with the first dose given on May 31, 2017.  Xtandi 160 mg daily started in July 2019.  Interim History: Anthony Villanueva is here for a follow-up visit.  Since her last visit, he started taking Xtandi without any complications related to this medication.  He denies any difficulties obtaining this medication or taking it daily.  He reports a reasonable appetite but have lost 4 pounds.  He reports he eats about 1 meal and a half daily and he is reasonably hungry most of the time.  He denies any bone pain or pathological fractures.  He denies any urinary difficulties.  He sleeps most of the night without any issues.   He does not report any headaches, blurry vision, syncope or seizures.  He denies any alteration mental status or confusion.  Does not report any fevers, chills or sweats.  Does not report any cough, wheezing or hemoptysis.  Does not report any chest pain, palpitation, orthopnea or leg edema.  Does not report any nausea, vomiting or abdominal pain.  Does not report any change in his bowel habits. Does not report any arthralgias or myalgias.   Does not report frequency, urgency or hematuria.  Does not report any skin rashes or lesions. Does not report any lymphadenopathy or petechiae.  Does not report any mood changes.  Remaining review of systems is negative.    Medications: I have reviewed the patient's current medications.  Current Outpatient Medications  Medication Sig Dispense Refill  . enzalutamide (XTANDI) 40 MG capsule Take 4  capsules (160 mg total) by mouth daily. 120 capsule 0   No current facility-administered medications for this visit.      Allergies: No Known Allergies  Past Medical History, Surgical history, Social history, and Family History were reviewed and updated.    Physical Exam:  Blood pressure (!) 144/83, pulse 100, temperature 98.5 F (36.9 C), temperature source Oral, resp. rate 18, height 6\' 1"  (1.854 m), weight 156 lb (70.8 kg), SpO2 99 %.   ECOG: 0 General appearance: Well-appearing gentleman without distress. Head: Atraumatic without abnormalities. Oropharynx: Mucous membranes are moist and pink. Eyes: Sclera anicteric.  Conjunctiva is clear. Lymph nodes: No lymphadenopathy noted in the cervical, supraclavicular, or axillary nodes  Heart:regular rate and rhythm, S1, S2 without any murmurs.  No leg edema. Lung: Clear to auscultation without any rhonchi, wheezes or dullness to percussion. Abdomin: soft, non-tender,, nondistended with good bowel sounds. Neurological: Intact motor, sensory and deep tendon reflexes. Skin: No ecchymosis or petechiae. Musculoskeletal: No clubbing or cyanosis.     Lab Results: Lab Results  Component Value Date   WBC 5.1 07/14/2017   HGB 13.0 07/14/2017   HCT 39.1 07/14/2017   MCV 94.3 07/14/2017   PLT 296 07/14/2017     Chemistry      Component Value Date/Time   NA 139 07/14/2017 1320   NA 137 06/17/2017 1223   K 4.0 07/14/2017 1320   CL 106 07/14/2017 1320   CO2 26 07/14/2017 1320   BUN 14 07/14/2017 1320  BUN 8 06/17/2017 1223   CREATININE 0.89 07/14/2017 1320      Component Value Date/Time   CALCIUM 9.3 07/14/2017 1320   ALKPHOS 109 07/14/2017 1320   AST 19 07/14/2017 1320   ALT 16 07/14/2017 1320   BILITOT 1.2 07/14/2017 1320     Results for Anthony Villanueva, Anthony Villanueva (MRN [id number]) as of 09/29/2017 09:26  Ref. Range 05/10/2017 17:52 07/14/2017 13:20  Prostate Specific Ag, Serum Latest Ref Range: 0.0 - 4.0 ng/mL  6.1 (H)  Prostatic  Specific Antigen Latest Ref Range: 0.00 - 4.00 ng/mL 293.00 (H)    IMPRESSION: Small punctate areas of increased activity noted over the ribs, thoracic spine, lumbar spine, and proximal left femur as discussed above. These could represent metastatic foci.  Impression and Plan:  62 year old man with:  1.    Castration-sensitive prostate cancer with bulky adenopathy diagnosed in April 2019 after presenting PSA of 293.   Therapy stated with Xtandi at 160 mg daily that was well-tolerated.  Risks and benefits of continuing this treatment long-term was reviewed including long-term complications.  After discussion today, he is agreeable to continue given his excellent response and PSA so far.  His PSA has declined to 6.1 from 293.  The natural course of his disease including reviewing his imaging studies were done today and future treatment options were also outlined.  2.  Hematuria and lower urinary tract symptoms: Resolved at this time after treating with androgen deprivation.  3.   Androgen deprivation: He continues to be on Lupron every 4 months and will receive it in August 2019 and repeated in December.   4.  Pain: He has no pain reported at this time.  We will continue to monitor given his overall disease status.  5.  Bone directed therapy: I recommended calcium and vitamin D supplements.  He has limited disease to the bone and Delton See may be an option for him in the future.  We will continue to address that with him after obtaining dental clearance.  6.  Prognosis: Treatment is palliative at this time with his disease is incurable.  Aggressive therapy is warranted.  7.  Anorexia: We have recommended strategies to improve his nutritional intake including nutritional supplements and increase the frequency of his meals.  8.  Follow-up: We will be in 2 months to follow his progress.  25  minutes was spent with the patient face-to-face today.  More than 50% of time was dedicated to  discussing the natural course of his disease, reviewing imaging studies and managing toxicity associated with his cancer treatment.     Anthony Button, MD 8/22/20199:25 AM

## 2017-09-29 NOTE — Progress Notes (Signed)
Pt. Tolerated injection well, No further problems or concerns noted. 

## 2017-09-29 NOTE — Patient Instructions (Signed)

## 2017-09-30 ENCOUNTER — Telehealth: Payer: Self-pay | Admitting: *Deleted

## 2017-09-30 LAB — PROSTATE-SPECIFIC AG, SERUM (LABCORP): Prostate Specific Ag, Serum: 2.1 ng/mL (ref 0.0–4.0)

## 2017-09-30 NOTE — Telephone Encounter (Signed)
As noted below by Dr. Shadad, I informed patient of his PSA level. He verbalized understanding.  

## 2017-09-30 NOTE — Telephone Encounter (Signed)
-----   Message from Wyatt Portela, MD sent at 09/30/2017  8:17 AM EDT ----- Please let him know his PSA is down.

## 2017-10-07 ENCOUNTER — Other Ambulatory Visit: Payer: Self-pay | Admitting: *Deleted

## 2017-10-07 DIAGNOSIS — C61 Malignant neoplasm of prostate: Secondary | ICD-10-CM

## 2017-10-07 MED ORDER — ENZALUTAMIDE 40 MG PO CAPS: 160.0000 mg | ORAL_CAPSULE | Freq: Every day | ORAL | 0 refills | Status: DC

## 2017-10-12 ENCOUNTER — Telehealth: Payer: Self-pay | Admitting: Pharmacist

## 2017-10-12 NOTE — Telephone Encounter (Signed)
Oral Oncology Pharmacist Encounter  Received call from patient on 10/11/2017 with information that he was almost out of his Xtandi. Patient instructed to call dispensing pharmacy to order [facility] refill. Phone number to [place] offered to patient, patient declined to take phone number, stated he already had the number.  Received call from patient today (10/12/2017) with information that he had contacted a pharmacy and that the only medication they had on file for him was an injection. Patient provided phone number to dispensing pharmacy, [facility], of [phone] and instructed to call [facility] to order his next Xtandi fill. Patient also instructed that phone number should be on the label of his last fill of Xtandi.  Patient states he threw away the bottle already.  Patient expressed understanding and appreciation.  He will let the office know if he has any issues refilling his Xtandi.  Johny Drilling, PharmD, BCPS, BCOP  10/12/2017 1:51 PM Oral Oncology Clinic [phone]

## 2017-10-13 ENCOUNTER — Telehealth: Payer: Self-pay | Admitting: Pharmacist

## 2017-10-13 NOTE — Telephone Encounter (Signed)
Oral Oncology Pharmacist Encounter  Received voicemail from [facility] patient assistance pharmacy [facility]) that they have been unsuccessful in attempting to reach patient to schedule next fill of his Xtandi.  I have spoken to patient twice this week already and directed him to call dispensing pharmacy in order to schedule his neck shipment of Xtandi.  I called and left voicemail for patient with phone number to dispensing pharmacy of [phone], and information that pharmacy had been trying to contact him.  I instructed patient on voicemail to call dispensing pharmacy to schedule his next shipment of Xtandi. I also left direct contact information for oral oncology clinic if patient has any additional questions.  Johny Drilling, PharmD, BCPS, BCOP  10/13/2017 1:57 PM Oral Oncology Clinic [phone]

## 2017-11-02 ENCOUNTER — Telehealth: Payer: Self-pay

## 2017-11-02 NOTE — Telephone Encounter (Signed)
Nutrition  Patient identified on Malnutrition screening report for poor appetite and weight loss.  Called patient and left message on voice mail asking patient to return call.  Jerardo Costabile B. Zenia Resides, [place], [place] Registered Dietitian [phone] (pager)

## 2017-11-08 ENCOUNTER — Telehealth: Payer: Self-pay

## 2017-11-08 NOTE — Telephone Encounter (Signed)
Nutrition  Patient returned RD call and left message on voicemail.  Returned patient's call today but had to leave voicemail to return RD call.  Dwight Adamczak B. Zenia Resides, [place], [place] Registered Dietitian [phone] (pager)

## 2017-11-09 ENCOUNTER — Telehealth: Payer: Self-pay

## 2017-11-09 NOTE — Telephone Encounter (Signed)
Nutrition:   Patient identified on Malnutrition Screening report for weight loss and poor appetite.   62 year old male with prostate cancer.  Patient taking [name] with patient via phone this pm.  Patient reports poor appetite and some diarrhea at times.  Has not noticed foods that increase diarrhea.  Patient reports that he is limited to what he eats due to limited cooking skills and not having transportation to get to the store.  Has not tried oral nutrition supplements.  Goes to bed at 3am and does not get up until late.  Reports ate 3 chicken leg and bread for lunch today and usually TV dinner for supper.  Likes koolaid and water and drinks 1 beer per day. Also likes peanut butter and jelly sandwiches.    Medications: reviewed  Labs: reviewed  Anthropometrics:   Height: 73 inches Weight: 156 lb UBW: 171 in April 2019 BMI: 20  9% weight loss in the last 6 months    NUTRITION DIAGNOSIS: Inadequate oral intake related to poor appetite as evidenced by 9% weight loss    INTERVENTION:  Encouraged small frequent meals. Discussed foods high in calories and protein.  Encouraged patient adding snack just prior to bedtime for additional calories and protein. Options discussed Encouraged oral nutrition supplements for added nutrition as well.  Offered nutrition appointment if needed and patient declined at this time.  Patient has RD contact information and was encouraged to call with questions Patient may benefit from appetite stimulant    MONITORING, EVALUATION, GOAL: patient will consume adequate calories and protein to prevent weight loss   NEXT VISIT: no follow-up planned, patient to contact RD  Anthony Villanueva, [place], [place] Registered Dietitian [phone] (pager)

## 2017-11-14 ENCOUNTER — Other Ambulatory Visit: Payer: Self-pay | Admitting: *Deleted

## 2017-11-14 DIAGNOSIS — C61 Malignant neoplasm of prostate: Secondary | ICD-10-CM

## 2017-11-14 MED ORDER — ENZALUTAMIDE 40 MG PO CAPS: 160.0000 mg | ORAL_CAPSULE | Freq: Every day | ORAL | 0 refills | Status: DC

## 2017-12-02 ENCOUNTER — Telehealth: Payer: Self-pay | Admitting: Oncology

## 2017-12-02 ENCOUNTER — Inpatient Hospital Stay (HOSPITAL_BASED_OUTPATIENT_CLINIC_OR_DEPARTMENT_OTHER): Payer: Self-pay | Admitting: Oncology

## 2017-12-02 ENCOUNTER — Inpatient Hospital Stay: Payer: Self-pay | Attending: Oncology

## 2017-12-02 VITALS — BP 176/95 | HR 94 | Temp 98.3°F | Resp 13 | Ht 73.0 in | Wt 165.4 lb

## 2017-12-02 DIAGNOSIS — C7951 Secondary malignant neoplasm of bone: Secondary | ICD-10-CM | POA: Insufficient documentation

## 2017-12-02 DIAGNOSIS — C61 Malignant neoplasm of prostate: Secondary | ICD-10-CM | POA: Insufficient documentation

## 2017-12-02 LAB — CBC WITH DIFFERENTIAL (CANCER CENTER ONLY)
Abs Immature Granulocytes: 0.04 10*3/uL (ref 0.00–0.07)
BASOS ABS: 0 10*3/uL (ref 0.0–0.1)
Basophils Relative: 1 %
EOS ABS: 0.1 10*3/uL (ref 0.0–0.5)
EOS PCT: 1 %
HEMATOCRIT: 39.7 % (ref 39.0–52.0)
HEMOGLOBIN: 13.5 g/dL (ref 13.0–17.0)
Immature Granulocytes: 1 %
LYMPHS PCT: 19 %
Lymphs Abs: 1 10*3/uL (ref 0.7–4.0)
MCH: 32.8 pg (ref 26.0–34.0)
MCHC: 34 g/dL (ref 30.0–36.0)
MCV: 96.4 fL (ref 80.0–100.0)
MONO ABS: 0.5 10*3/uL (ref 0.1–1.0)
MONOS PCT: 9 %
NRBC: 0 % (ref 0.0–0.2)
Neutro Abs: 3.8 10*3/uL (ref 1.7–7.7)
Neutrophils Relative %: 69 %
Platelet Count: 277 10*3/uL (ref 150–400)
RBC: 4.12 MIL/uL — ABNORMAL LOW (ref 4.22–5.81)
RDW: 12.7 % (ref 11.5–15.5)
WBC Count: 5.5 10*3/uL (ref 4.0–10.5)

## 2017-12-02 LAB — CMP (CANCER CENTER ONLY)
ALK PHOS: 88 U/L (ref 38–126)
ALT: 11 U/L (ref 0–44)
AST: 14 U/L — ABNORMAL LOW (ref 15–41)
Albumin: 3.5 g/dL (ref 3.5–5.0)
Anion gap: 11 (ref 5–15)
BILIRUBIN TOTAL: 0.8 mg/dL (ref 0.3–1.2)
BUN: 10 mg/dL (ref 6–20)
CALCIUM: 9.5 mg/dL (ref 8.9–10.3)
CO2: 26 mmol/L (ref 22–32)
Chloride: 104 mmol/L (ref 98–111)
Creatinine: 0.76 mg/dL (ref 0.61–1.24)
GLUCOSE: 106 mg/dL — AB (ref 70–99)
Potassium: 4.2 mmol/L (ref 3.5–5.1)
Sodium: 141 mmol/L (ref 135–145)
TOTAL PROTEIN: 7.5 g/dL (ref 6.5–8.1)

## 2017-12-02 MED ORDER — CALCIUM CARBONATE-VITAMIN D 500-200 MG-UNIT PO TABS: 1.0000 | ORAL_TABLET | Freq: Two times a day (BID) | ORAL | 3 refills | Status: DC

## 2017-12-02 MED FILL — OYSTER SHELL CALCIUM-VIT D: 500-200 | 30 days supply | Qty: 60 | Fill #0

## 2017-12-02 NOTE — Progress Notes (Signed)
Hematology and Oncology Follow Up Visit  Anthony Villanueva [id number] 03-06-1955 62 y.o. 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  He was PSA of 293.     Prior Therapy: He is status post CT-guided biopsy of retroperitoneal adenopathy on April 3 of 2019  Current therapy:  Lupron 30 mg every 4 months with the first dose given on May 31, 2017.  Xtandi 160 mg daily started in July 2019.  Interim History: [name] returns today for repeat evaluation.  Since last visit, he continues to show improvement in his overall health and quality of life.  He reports no bone pain or pelvic discomfort anymore.  His urination difficulties has improved although he does report some frequency and nocturia.  Has performance status and quality of life continues to improve.  His appetite is better and he has gained weight.  He remains active and has tolerated Xtandi well.  He denies any excessive fatigue, nausea or lower extremity edema associated with it.   He does not report any headaches, blurry vision, syncope or seizures.  He denies any alteration in mentation or lethargy.  Does not report any fevers, chills or sweats.  Does not report any cough, wheezing or hemoptysis.  Does not report any chest pain, palpitation, orthopnea or leg edema.  Does not report any nausea, vomiting or abdominal pain.  Does not report any constipation or diarrhea. Does not report any bone pain or pathological fractures.  Does not report frequency, urgency or hematuria.  Does not report any skin rashes or lesions. Does not report any bleeding or clotting tendency.  Does not report anxiety or depression.  Remaining review of systems is negative.    Medications: I have reviewed the patient's current medications.  Current Outpatient Medications  Medication Sig Dispense Refill  .  enzalutamide (XTANDI) 40 MG capsule Take 4 capsules (160 mg total) by mouth daily. 120 capsule 0   No current facility-administered medications for this visit.      Allergies: No Known Allergies  Past Medical History, Surgical history, Social history, and Family History were reviewed and updated.    Physical Exam:  Blood pressure (!) 176/95, pulse 94, temperature 98.3 F (36.8 C), temperature source Oral, resp. rate 13, height 6\' 1"  (1.854 m), weight 165 lb 6.4 oz (75 kg), SpO2 100 %.    ECOG: 0   General appearance: Comfortable appearing without any discomfort Head: Normocephalic without any trauma Oropharynx: Mucous membranes are moist and pink without any thrush or ulcers. Eyes: Pupils are equal and round reactive to light. Lymph nodes: No cervical, supraclavicular, inguinal or axillary lymphadenopathy.   Heart:regular rate and rhythm.  S1 and S2 without leg edema. Lung: Clear without any rhonchi or wheezes.  No dullness to percussion. Abdomin: Soft, nontender, nondistended with good bowel sounds.  No hepatosplenomegaly. Musculoskeletal: No joint deformity or effusion.  Full range of motion noted. Neurological: No deficits noted on motor, sensory and deep tendon reflex exam. Skin: No petechial rash or dryness.  Appeared moist.      Lab Results: Lab Results  Component Value Date   WBC 5.5 09/29/2017   HGB 13.8 09/29/2017   HCT 40.7 09/29/2017   MCV 95.3 09/29/2017   PLT 366 09/29/2017     Chemistry      Component Value Date/Time   NA 143 09/29/2017 0918   NA 137 06/17/2017 1223  K 4.2 09/29/2017 0918   CL 110 09/29/2017 0918   CO2 24 09/29/2017 0918   BUN 12 09/29/2017 0918   BUN 8 06/17/2017 1223   CREATININE 0.79 09/29/2017 0918      Component Value Date/Time   CALCIUM 9.9 09/29/2017 0918   ALKPHOS 81 09/29/2017 0918   AST 11 (L) 09/29/2017 0918   ALT 8 09/29/2017 0918   BILITOT 0.5 09/29/2017 0918      Results for Anthony Villanueva, Anthony Villanueva (MRN [id number])  as of 12/02/2017 08:57  Ref. Range 05/10/2017 17:52 07/14/2017 13:20 09/29/2017 09:18  Prostate Specific Ag, Serum Latest Ref Range: 0.0 - 4.0 ng/mL  6.1 (H) 2.1  Prostatic Specific Antigen Latest Ref Range: 0.00 - 4.00 ng/mL 293.00 (H)     Impression and Plan:  62 year old man with:  1.    Castration-sensitive prostate cancer diagnosed in April 2019.  He was found to have adenopathy and bone disease.   He is currently on Xtandi and has tolerated therapy reasonably well.  His PSA showed excellent response predictably currently at 2.1 and PSA pending from today.  The natural course of his disease and risks and benefits associated with this therapy was reviewed today.  Long-term complication associated with this treatment was also discussed.  He is agreeable to continue at this time.  2.  Hematuria and lower urinary tract symptoms: His hematuria has resolved and lower urinary tract symptoms has improved.  3.   Androgen deprivation: His next Lupron will be in December 2019.  Risks and benefits of continuing this therapy long-term was discussed today he is agreeable to continue.   4.  Pain: He is no longer reporting any pain at this time.  5.  Bone directed therapy: He is a candidate for Xgeva at this time.  We will start calcium and vitamin D supplements and urged him to obtain dental clearance for tentatively starting Xgeva over the next visit.  I will refer him to dental medicine for evaluation and possibly teeth need to be pulled prior to proceeding with Xgeva.  6.  Prognosis: Therapy remains palliative at this time but his performance status is adequate.  Aggressive therapy is warranted.  7.  Anorexia: His weight has improved and his appetite is reverting back to normal.  8.  Follow-up: We will be in 2 months for repeat evaluation.  25  minutes was spent with the patient face-to-face today.  More than 50% of time was dedicated to reviewing his disease status, treatment options and  dealing with complications related to therapy.     Zola Button, MD 10/25/20198:58 AM

## 2017-12-02 NOTE — Telephone Encounter (Signed)
Scheduled appt per 10/25 los - gave patient AVS and calender per los.  

## 2017-12-03 LAB — PROSTATE-SPECIFIC AG, SERUM (LABCORP): Prostate Specific Ag, Serum: 1.5 ng/mL (ref 0.0–4.0)

## 2017-12-05 ENCOUNTER — Telehealth: Payer: Self-pay

## 2017-12-05 NOTE — Telephone Encounter (Signed)
-----   Message from Wyatt Portela, MD sent at 12/05/2017  8:38 AM EDT ----- Please let him know PSA is down.

## 2017-12-05 NOTE — Telephone Encounter (Signed)
Contacted patient and left VM message with result.

## 2017-12-12 ENCOUNTER — Other Ambulatory Visit: Payer: Self-pay | Admitting: Oncology

## 2017-12-12 ENCOUNTER — Encounter (HOSPITAL_COMMUNITY): Payer: Self-pay | Admitting: Dentistry

## 2017-12-12 ENCOUNTER — Ambulatory Visit (HOSPITAL_COMMUNITY): Payer: Self-pay | Admitting: Dentistry

## 2017-12-12 VITALS — BP 188/95 | HR 79 | Temp 98.4°F

## 2017-12-12 DIAGNOSIS — K0889 Other specified disorders of teeth and supporting structures: Secondary | ICD-10-CM

## 2017-12-12 DIAGNOSIS — I159 Secondary hypertension, unspecified: Secondary | ICD-10-CM

## 2017-12-12 DIAGNOSIS — K029 Dental caries, unspecified: Secondary | ICD-10-CM

## 2017-12-12 DIAGNOSIS — Z01818 Encounter for other preprocedural examination: Secondary | ICD-10-CM

## 2017-12-12 DIAGNOSIS — K045 Chronic apical periodontitis: Secondary | ICD-10-CM

## 2017-12-12 DIAGNOSIS — M2632 Excessive spacing of fully erupted teeth: Secondary | ICD-10-CM

## 2017-12-12 DIAGNOSIS — C7951 Secondary malignant neoplasm of bone: Secondary | ICD-10-CM

## 2017-12-12 DIAGNOSIS — K011 Impacted teeth: Secondary | ICD-10-CM

## 2017-12-12 DIAGNOSIS — K083 Retained dental root: Secondary | ICD-10-CM

## 2017-12-12 DIAGNOSIS — K08409 Partial loss of teeth, unspecified cause, unspecified class: Secondary | ICD-10-CM

## 2017-12-12 DIAGNOSIS — K0530 Chronic periodontitis, unspecified: Secondary | ICD-10-CM

## 2017-12-12 DIAGNOSIS — C61 Malignant neoplasm of prostate: Secondary | ICD-10-CM

## 2017-12-12 DIAGNOSIS — K036 Deposits [accretions] on teeth: Secondary | ICD-10-CM

## 2017-12-12 DIAGNOSIS — M264 Malocclusion, unspecified: Secondary | ICD-10-CM

## 2017-12-12 DIAGNOSIS — K06010 Localized gingival recession, unspecified: Secondary | ICD-10-CM

## 2017-12-12 DIAGNOSIS — K0401 Reversible pulpitis: Secondary | ICD-10-CM

## 2017-12-12 NOTE — Patient Instructions (Signed)
Patient was instructed to follow-up with the financial counselors that the cancer center as well as social services to determine if he is eligible for Medicaid. Patient will most likely be referred to cardiology for cardiac clearance as well as evaluation of his elevated blood pressure readings. Patient will then be scheduled for surgery in the operating room with general anesthesia with dental medicine. Patient will then be referred to an oral surgeon for evaluation for extraction of tooth #17. The Xgeva therapy is to be held at this time until after adequate healing from all dental extractions.  Dr. Enrique Sack

## 2017-12-12 NOTE — Progress Notes (Signed)
DENTAL CONSULTATION  Date of Consultation:  12/12/2017 Patient Name:   Anthony Villanueva Date of Birth:   1955-05-21 Medical Record Number: [id number]  VITALS: BP (!) 188/95 (BP Location: Right Arm)   Pulse 79   Temp 98.4 F (36.9 C)   CHIEF COMPLAINT: Patient referred for Dr. Alen Blew for dental consultation.  HPI: Anthony Villanueva is a 62 year old male recently diagnosed with prostate cancer and bone metastases. Patient currently receiving Lupron injections and Xtandi with anticipated Xgeva therapy. Patient referred by Dr. Alen Blew for a dental consultation. Patient is now seen as part of a pre-Xgeva therapy dental protocol examination.  Patient currently is complaining of toothache symptoms of the lower right quadrant and mandibular anterior teeth. Patient describes dull achy pain and last for hours at a time. This is been occurring for the past several months. Patient has not seen a dentist in over 20 years. Patient indicates that " if it ain't broke, don't fix it ". Patient denies having partial dentures. Patient denies having dental phobia.  PROBLEM LIST: Patient Active Problem List   Diagnosis Date Noted  . Prostate cancer ([place]) 05/19/2017    Priority: High  . Pelvic mass 05/10/2017  . Constipation 05/10/2017  . Weight loss, unintentional 05/10/2017  . Hematuria 05/10/2017    PMH: Past Medical History:  Diagnosis Date  . Childhood asthma     PSH: Past Surgical History:  Procedure Laterality Date  . PROSTATE BIOPSY  05/11/2017   CT core bx L retroperitoneal LAN /notes 05/11/2017    ALLERGIES: No Known Allergies  MEDICATIONS: Current Outpatient Medications  Medication Sig Dispense Refill  . calcium-vitamin D (OSCAL WITH D) 500-200 MG-UNIT tablet Take 1 tablet by mouth 2 (two) times daily. 90 tablet 3  . enzalutamide (XTANDI) 40 MG capsule Take 4 capsules (160 mg total) by mouth daily. 120 capsule 0  . loperamide (IMODIUM A-D) 2 MG tablet Take 2 mg by mouth as needed for  diarrhea or loose stools.     No current facility-administered medications for this visit.      LABS: Lab Results  Component Value Date   WBC 5.5 12/02/2017   HGB 13.5 12/02/2017   HCT 39.7 12/02/2017   MCV 96.4 12/02/2017   PLT 277 12/02/2017      Component Value Date/Time   NA 141 12/02/2017 0847   NA 137 06/17/2017 1223   K 4.2 12/02/2017 0847   CL 104 12/02/2017 0847   CO2 26 12/02/2017 0847   GLUCOSE 106 (H) 12/02/2017 0847   BUN 10 12/02/2017 0847   BUN 8 06/17/2017 1223   CREATININE 0.76 12/02/2017 0847   CALCIUM 9.5 12/02/2017 0847   GFRNONAA >60 12/02/2017 0847   GFRAA >60 12/02/2017 0847   Lab Results  Component Value Date   INR 1.02 06/03/2017   INR 1.06 05/11/2017   No results found for: PTT  SOCIAL HISTORY: Social History   Socioeconomic History  . Marital status: Significant Other    Spouse name: Not on file  . Number of children: 5  . Years of education: Not on file  . Highest education level: Not on file  Occupational History  . Not on file  Social Needs  . Financial resource strain: Not on file  . Food insecurity:    Worry: Not on file    Inability: Not on file  . Transportation needs:    Medical: Not on file    Non-medical: Not on file  Tobacco Use  . Smoking status: Current  Every Day Smoker    Packs/day: 0.25    Years: 43.00    Pack years: 10.75    Types: Cigarettes  . Smokeless tobacco: Never Used  Substance and Sexual Activity  . Alcohol use: Yes    Alcohol/week: 23.0 standard drinks    Types: 23 Cans of beer per week    Comment: 05/11/2017 "40oz beer/day"  . Drug use: Yes    Types: Marijuana    Comment: 05/11/2017 "daily"  . Sexual activity: Not Currently  Lifestyle  . Physical activity:    Days per week: Not on file    Minutes per session: Not on file  . Stress: Not on file  Relationships  . Social connections:    Talks on phone: Not on file    Gets together: Not on file    Attends religious service: Not on file     Active member of club or organization: Not on file    Attends meetings of clubs or organizations: Not on file    Relationship status: Not on file  . Intimate partner violence:    Fear of current or ex partner: Not on file    Emotionally abused: Not on file    Physically abused: Not on file    Forced sexual activity: Not on file  Other Topics Concern  . Not on file  Social History Narrative  . Not on file    FAMILY HISTORY: History reviewed. No pertinent family history.  REVIEW OF SYSTEMS: Reviewed with the patient as per History of present illness. Psych: Patient denies having dental phobia.  DENTAL HISTORY: CHIEF COMPLAINT: Patient referred for Dr. Alen Blew for dental consultation.  HPI: Anthony Villanueva is a 62 year old male recently diagnosed with prostate cancer and bone metastases. Patient currently receiving Lupron injections and Xtandi with anticipated Xgeva therapy. Patient referred by Dr. Alen Blew for a dental consultation. Patient is now seen as part of a pre-Xgeva therapy dental protocol examination.  Patient currently is complaining of toothache symptoms of the lower right quadrant and mandibular anterior teeth. Patient describes dull achy pain and last for hours at a time. This is been occurring for the past several months. Patient has not seen a dentist in over 20 years. Patient indicates that " if it ain't broke, don't fix it ". Patient denies having partial dentures. Patient denies having dental phobia.  DENTAL EXAMINATION: GENERAL:  The patient is a well-developed, well-nourished male in no acute distress. HEAD AND NECK:  There is no palpable lymphadenopathy. The patient denies acute TMJ symptoms. INTRAORAL EXAM:  Patient has normal saliva. There is no evidence of oral abscess formation. DENTITION:  Patient is missing tooth numbers 2, 8-10, 15, 16, 18, 23, 24, and 28. Tooth #17 is a full bony impaction. Tooth #32 as retained root segment. Multiple diastemas are  noted. PERIODONTAL:  The patient has chronic, advanced periodontal disease with plaque and calculus accumulations, generalized gingival recession, and generalized tooth mobility. There is moderate to severe bone loss noted. DENTAL CARIES/SUBOPTIMAL RESTORATIONS:  Multiple dental caries are noted. ENDODONTIC:  Patient has a history of acute pulpitis symptoms. There are multiple teeth with periapical pathology and radiolucencies. CROWN AND BRIDGE:  There are no crown or bridge restorations. PROSTHODONTIC: The patient denies having partial dentures. OCCLUSION: The patient has a poor occlusal scheme secondary to multiple missing teeth,multiple diastemas, supra-eruption and drifting of the unopposed teeth into the edentulous areas, and lack of replacement of missing teeth with dental prostheses.  RADIOGRAPHIC INTERPRETATION: An orthopantogram was  taken today. There are multiple missing teeth. There is an impacted tooth #17. There are multiple diastemas noted. There is moderate to severe bone loss. Multiple dental caries are noted. Multiple teeth have periapical pathology and radiolucencies. There is supra-eruption and drifting of the unopposed teeth into the edentulous areas. Radiographic calculus is noted.   ASSESSMENTS: 1. Prostate cancer 2. Bone metastases 3. Pre-Xgeva therapy dental protocol examination 4. History of acute pulpitis symptoms 5. Chronic apical periodontitis 6. Multiple dental caries 7. Chronic periodontitis with bone loss 8. Generalized gingival recession 9. Accretions 10. Tooth mobility 11. Multiple missing teeth 12. Impacted tooth #17. 13. Multiple diastemas 14. Supra-eruption and drifting of the unopposed teeth into the edentulous areas 15. Malocclusion    PLAN/RECOMMENDATIONS: 1. I discussed the risks, benefits, and complications of various treatment options with the patient in relationship to his medical and dental conditions, current chemotherapy, and anticipated  Xgeva therapy. We discussed various treatment options to include no treatment, multiple extractions with alveoloplasty, pre-prosthetic surgery as indicated, periodontal therapy, dental restorations, root canal therapy, crown and bridge therapy, implant therapy, and replacement of missing teeth as indicated. We also discussed referral to an oral surgeon for removal of all the teeth or just the impacted tooth #17.  The patient currently wishes to proceed with extraction of multiple teeth (with the exception of the impacted tooth #17) with alveoloplasty and pre-prosthetic surgery as indicated in the operating room with general anesthesia with Dental Medicine. The patient will then be referred to an oral surgeon for evaluation for the extraction of impacted tooth #17 as indicated.  The patient ideally needs a cardiac consultation and clearance prior to the anticipated procedures with general anesthesia in the operating room. Evaluation of the patient for elevated blood pressure could be evaluated by the cardiology team as well. Medical Oncology will be instructed to hold use of Xgeva therapy until after all teeth are extracted and have healed in completely.  Patient is to continue to work with financial counselors at the [employer] as well as Social Services to proceed with determination of his ability to qualify for Medicaid.   2. Discussion of findings with medical team and coordination of future medical and dental care as needed.  I spent in excess of  120 minutes during the conduct of this consultation and >50% of this time involved direct face-to-face encounter for counseling and/or coordination of the patient's care.    Lenn Cal, DDS

## 2017-12-22 ENCOUNTER — Other Ambulatory Visit: Payer: Self-pay

## 2017-12-22 DIAGNOSIS — C61 Malignant neoplasm of prostate: Secondary | ICD-10-CM

## 2017-12-22 MED ORDER — ENZALUTAMIDE 40 MG PO CAPS: 160.0000 mg | ORAL_CAPSULE | Freq: Every day | ORAL | 0 refills | Status: DC

## 2018-01-16 ENCOUNTER — Ambulatory Visit (INDEPENDENT_AMBULATORY_CARE_PROVIDER_SITE_OTHER): Payer: Self-pay | Admitting: Cardiology

## 2018-01-16 ENCOUNTER — Encounter: Payer: Self-pay | Admitting: Cardiology

## 2018-01-16 VITALS — BP 182/84 | HR 115 | Ht 72.5 in | Wt 156.6 lb

## 2018-01-16 DIAGNOSIS — Z7189 Other specified counseling: Secondary | ICD-10-CM

## 2018-01-16 DIAGNOSIS — Z01810 Encounter for preprocedural cardiovascular examination: Secondary | ICD-10-CM

## 2018-01-16 DIAGNOSIS — Z716 Tobacco abuse counseling: Secondary | ICD-10-CM

## 2018-01-16 DIAGNOSIS — I10 Essential (primary) hypertension: Secondary | ICD-10-CM

## 2018-01-16 MED ORDER — AMLODIPINE BESYLATE 10 MG PO TABS: 10.0000 mg | ORAL_TABLET | Freq: Every day | ORAL | 3 refills | Status: DC

## 2018-01-16 NOTE — Patient Instructions (Signed)
Medication Instructions:  Start Amlodipine 10 mg daily  If you need a refill on your cardiac medications before your next appointment, please call your pharmacy.   Lab work: None ordered   Testing/Procedures: None ordered  Follow-Up: At [facility], you and your health needs are our priority.  As part of our continuing mission to provide you with exceptional heart care, we have created designated Provider Care Teams.  These Care Teams include your primary Cardiologist (physician) and Advanced Practice Providers (APPs -  Physician Assistants and Nurse Practitioners) who all work together to provide you with the care you need, when you need it. . Schedule appointment with Hypertension Clinic in 6 weeks .  Marland Kitchen Schedule appointment with Dr.Christopher in 3 months

## 2018-01-16 NOTE — Progress Notes (Signed)
Cardiology Office Note:    Date:  01/16/2018   ID:  Anthony Villanueva, DOB 19-Mar-1955, MRN [id number]  PCP:  Patient, No Pcp Per  Cardiologist:  Buford Dresser, MD PhD  Referring MD: Wyatt Portela, MD   CC. Preoperative cardiac evaluation  History of Present Illness:    Anthony Villanueva is a 62 y.o. male with a hx of prostate cancer who is seen as a new consult at the request of Shadad, Mathis Dad, MD for the preoperative dental surgery cardiac evaluation.  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 No issues with multiple flights of stairs.  FH: mother died of cancer, one brother in poor health due to cocaine. No known heart disease or stroke.   Down to 1 ppd, was 2 ppd, since age 6. Drinks 2-40oz per day. MJ daily, no cocaine or heroin.   Past Medical History:  Diagnosis Date  . Childhood asthma     Past Surgical History:  Procedure Laterality Date  . PROSTATE BIOPSY  05/11/2017   CT core bx L retroperitoneal LAN /notes 05/11/2017    Current Medications: Current Outpatient Medications on File Prior to Visit  Medication Sig  . acetaminophen (TYLENOL) 500 MG tablet Take 500 mg by mouth as needed.  . calcium-vitamin D (OSCAL WITH D) 500-200 MG-UNIT tablet Take 1 tablet by mouth 2 (two) times daily.  . enzalutamide (XTANDI) 40 MG capsule Take 4 capsules (160 mg total) by mouth daily.   No current facility-administered medications on file prior to visit.      Allergies:   Patient has  no known allergies.   Social History   Socioeconomic History  . Marital status: Significant Other    Spouse name: Not on file  . Number of children: 5  . Years of education: Not on file  . Highest education level: Not on file  Occupational History  . Not on file  Social Needs  . Financial resource strain: Not on file  . Food insecurity:    Worry: Not on file    Inability: Not on file  . Transportation needs:    Medical: Not on file    Non-medical: Not on file  Tobacco Use  . Smoking status: Current Every Day Smoker    Packs/day: 0.25    Years: 43.00    Pack years: 10.75    Types: Cigarettes  . Smokeless tobacco: Never Used  Substance and Sexual Activity  . Alcohol use: Yes    Alcohol/week: 23.0 standard drinks    Types: 23 Cans of beer per week    Comment: 05/11/2017 "40oz beer/day"  . Drug use: Yes    Types: Marijuana    Comment: 05/11/2017 "daily"  . Sexual activity: Not Currently  Lifestyle  . Physical activity:    Days per week: Not on file    Minutes per session: Not on file  . Stress: Not on file  Relationships  . Social connections:    Talks on phone: Not on file    Gets together: Not on file    Attends religious service: Not on file    Active member of club or organization: Not on  file    Attends meetings of clubs or organizations: Not on file    Relationship status: Not on file  Other Topics Concern  . Not on file  Social History Narrative  . Not on file     Family History: mother died of cancer, one brother in poor health due to cocaine. No known heart disease or stroke.   ROS:   Please see the history of present illness.  Additional pertinent ROS:  Constitutional: Negative for chills, fever, night sweats (used to have at initial diagnosis, now improve). Positive for unintentional weight loss  HENT: Negative for ear pain and hearing loss.   Eyes: Negative for loss of vision and eye pain.  Respiratory: Negative for cough, sputum, shortness of  breath, wheezing.   Cardiovascular: Negative for chest pain, palpitations, PND, orthopnea, lower extremity edema and claudication.  Gastrointestinal: Negative for abdominal pain, melena, and hematochezia. Had severe diarrhea initially, lost 75 lbs.  Genitourinary: Negative for dysuria and hematuria. (initial hematuria resolved). Musculoskeletal: Negative for falls and myalgias.  Skin: Negative for itching and rash.  Neurological: Negative for focal weakness, focal sensory changes and loss of consciousness.  Endo/Heme/Allergies: Does not bruise/bleed easily.    EKGs/Labs/Other Studies Reviewed:    The following studies were reviewed today: No prior cardiac testing.   EKG:  EKG is personally reviewed.  The ekg ordered today demonstrates sinus tachycardia.   Recent Labs: 06/03/2017: Magnesium 2.0 12/02/2017: ALT 11; BUN 10; Creatinine 0.76; Hemoglobin 13.5; Platelet Count 277; Potassium 4.2; Sodium 141  Recent Lipid Panel No results found for: CHOL, TRIG, HDL, CHOLHDL, VLDL, LDLCALC, LDLDIRECT  Physical Exam:    VS:  BP (!) 182/84 (BP Location: Right Arm, Patient Position: Sitting, Cuff Size: Normal)   Pulse (!) 115   Ht 6' 0.5" (1.842 m)   Wt 156 lb 9.6 oz (71 kg)   BMI 20.95 kg/m     Wt Readings from Last 3 Encounters:  01/16/18 156 lb 9.6 oz (71 kg)  12/02/17 165 lb 6.4 oz (75 kg)  09/29/17 156 lb (70.8 kg)     GEN: Thin gentleman in no acute distress HEENT: Normal NECK: No JVD; No carotid bruits LYMPHATICS: No lymphadenopathy CARDIAC: regular rhythm, normal S1 and S2, no murmurs, rubs, gallops. Radial and DP pulses 2+ bilaterally. RESPIRATORY:  Clear to auscultation without rales, wheezing or rhonchi  ABDOMEN: Soft, non-tender, non-distended MUSCULOSKELETAL:  No edema; No deformity  SKIN: Warm and dry NEUROLOGIC:  Alert and oriented x 3 PSYCHIATRIC:  Normal affect   ASSESSMENT:    1. Preop cardiovascular exam   2. Essential hypertension   3. Tobacco abuse  counseling   4. Counseling on health promotion and disease prevention    PLAN:    Preoperative cardiovascular assessment: no active symptoms, no personal history. Can achieve >4 METs without symptoms. According to ACC/AHA guidelines, no further cardiovascular testing needed.  The patient may proceed to surgery at acceptable risk.    2. Hypertension: elevated today. Does not currently have PCP. Notes that it has been elevated at several visits. He is in some pain, but not significant enough to explain tachycardia/hypertension. No symptoms suggestive of hypertensive urgency/emergency.  -will start amlodipine 10 mg today. Counseled on leg edema  -discussed chlorthalidone, but he is concerned as he already urinates frequently, and each time he urinates he also has to have a bowel movement. He would prefer to avoid any medications that may increase urinary frequency  -will have him follow up with hypertension  clinic in 6 weeks to monitor BP. Will likely need second medication for control.  3. Tobacco abuse counseling: The patient was counseled on tobacco cessation today for 7 minutes.  Counseling included reviewing the risks of smoking tobacco products, how it impacts the patient's current medical diagnoses and different strategies for quitting.  Pharmacotherapy to aid in tobacco cessation was not prescribed today.  -discussed that tobacco cessation will also improve wound healing post surgery. He will work on quitting and contact us if he wants any aids for assistance.  4. Primary prevention -recommend heart healthy/Mediterranean diet, with whole grains, fruits, vegetable, fish, lean meats, nuts, and olive oil. Limit salt. -recommend moderate walking, 3-5 times/week for 30-50 minutes each session. Aim for at least 150 minutes.week. Goal should be pace of 3 miles/hours, or walking 1.5 miles in 30 minutes -recommend avoidance of tobacco products. Avoid excess alcohol. -unclear what his overall prognosis  is regarding his cancer, but notes indicate treatment is for management and not cure. Given this, will hold on aggressive primary prevention. He has not had lipids done, but it would be reasonable to check with his next cancer center labs.  Plan for follow up: 6 weeks with HTN clinic, 3 mos with me. Once BP controlled and/or he has primary care, can lengthen follow up visits.  Medication Adjustments/Labs and Tests Ordered: Current medicines are reviewed at length with the patient today.  Concerns regarding medicines are outlined above.  Orders Placed This Encounter  Procedures  . EKG 12-Lead   Meds ordered this encounter  Medications  . amLODipine (NORVASC) 10 MG tablet    Sig: Take 1 tablet (10 mg total) by mouth daily.    Dispense:  90 tablet    Refill:  3    Patient Instructions  Medication Instructions:  Start Amlodipine 10 mg daily  If you need a refill on your cardiac medications before your next appointment, please call your pharmacy.   Lab work: None ordered   Testing/Procedures: None ordered  Follow-Up: At [facility], you and your health needs are our priority.  As part of our continuing mission to provide you with exceptional heart care, we have created designated Provider Care Teams.  These Care Teams include your primary Cardiologist (physician) and Advanced Practice Providers (APPs -  Physician Assistants and Nurse Practitioners) who all work together to provide you with the care you need, when you need it. . Schedule appointment with Hypertension Clinic in 6 weeks .  Marland Kitchen Schedule appointment with Dr.Yessenia Maillet in 3 months       Signed, Buford Dresser, MD PhD 01/16/2018 1:21 PM    [place]

## 2018-01-17 ENCOUNTER — Telehealth (HOSPITAL_COMMUNITY): Payer: Self-pay | Admitting: Dentistry

## 2018-01-17 NOTE — Telephone Encounter (Signed)
01/17/2018  Patient:            Anthony Villanueva Date of Birth:  08/18/1955 MRN:                [id number]   I performed a chart review and found that patient had been cleared by Dr. Harrell Gave for anticipated oral surgery. I then left a message on the patient's answering machine this morning to have him call Dental Medicine and discuss plan of care for extraction of multiple teeth with alveoloplasty in the operating with general anesthesia.  I'm attempting to schedule this patient for surgery this coming Thursday as time and space permits in the operating room schedule at [facility].  Lenn Cal, DDS

## 2018-01-19 ENCOUNTER — Other Ambulatory Visit (HOSPITAL_COMMUNITY): Payer: Self-pay | Admitting: Dentistry

## 2018-01-20 ENCOUNTER — Telehealth (HOSPITAL_COMMUNITY): Payer: Self-pay

## 2018-01-20 NOTE — Telephone Encounter (Signed)
Called and left message for patient regarding his dental surgery scheduled for 02-16-18 at 7:30 am at [facility]. Asked patient to call back to confirm he received the message. Molli Posey

## 2018-01-26 ENCOUNTER — Other Ambulatory Visit: Payer: Self-pay | Admitting: *Deleted

## 2018-01-26 DIAGNOSIS — C61 Malignant neoplasm of prostate: Secondary | ICD-10-CM

## 2018-01-26 MED ORDER — ENZALUTAMIDE 40 MG PO CAPS: 160.0000 mg | ORAL_CAPSULE | Freq: Every day | ORAL | 0 refills | Status: DC

## 2018-02-02 ENCOUNTER — Inpatient Hospital Stay (HOSPITAL_BASED_OUTPATIENT_CLINIC_OR_DEPARTMENT_OTHER): Payer: Self-pay | Admitting: Oncology

## 2018-02-02 ENCOUNTER — Inpatient Hospital Stay: Payer: Self-pay

## 2018-02-02 ENCOUNTER — Inpatient Hospital Stay: Payer: Self-pay | Attending: Oncology

## 2018-02-02 ENCOUNTER — Telehealth: Payer: Self-pay | Admitting: Oncology

## 2018-02-02 VITALS — BP 173/97 | HR 99 | Temp 97.9°F | Resp 14 | Ht 72.5 in | Wt 156.7 lb

## 2018-02-02 VITALS — BP 145/95 | HR 80 | Temp 97.8°F | Resp 18

## 2018-02-02 DIAGNOSIS — Z5111 Encounter for antineoplastic chemotherapy: Secondary | ICD-10-CM | POA: Insufficient documentation

## 2018-02-02 DIAGNOSIS — C61 Malignant neoplasm of prostate: Secondary | ICD-10-CM | POA: Insufficient documentation

## 2018-02-02 DIAGNOSIS — C7951 Secondary malignant neoplasm of bone: Secondary | ICD-10-CM

## 2018-02-02 DIAGNOSIS — R630 Anorexia: Secondary | ICD-10-CM | POA: Insufficient documentation

## 2018-02-02 LAB — CBC WITH DIFFERENTIAL (CANCER CENTER ONLY)
Abs Immature Granulocytes: 0.01 10*3/uL (ref 0.00–0.07)
BASOS ABS: 0 10*3/uL (ref 0.0–0.1)
Basophils Relative: 1 %
Eosinophils Absolute: 0 10*3/uL (ref 0.0–0.5)
Eosinophils Relative: 1 %
HCT: 43.9 % (ref 39.0–52.0)
Hemoglobin: 14.7 g/dL (ref 13.0–17.0)
IMMATURE GRANULOCYTES: 0 %
Lymphocytes Relative: 20 %
Lymphs Abs: 1 10*3/uL (ref 0.7–4.0)
MCH: 32.3 pg (ref 26.0–34.0)
MCHC: 33.5 g/dL (ref 30.0–36.0)
MCV: 96.5 fL (ref 80.0–100.0)
Monocytes Absolute: 0.4 10*3/uL (ref 0.1–1.0)
Monocytes Relative: 8 %
NRBC: 0 % (ref 0.0–0.2)
Neutro Abs: 3.5 10*3/uL (ref 1.7–7.7)
Neutrophils Relative %: 70 %
Platelet Count: 308 10*3/uL (ref 150–400)
RBC: 4.55 MIL/uL (ref 4.22–5.81)
RDW: 12.3 % (ref 11.5–15.5)
WBC: 4.9 10*3/uL (ref 4.0–10.5)

## 2018-02-02 LAB — CMP (CANCER CENTER ONLY)
ALT: 20 U/L (ref 0–44)
AST: 20 U/L (ref 15–41)
Albumin: 3.6 g/dL (ref 3.5–5.0)
Alkaline Phosphatase: 79 U/L (ref 38–126)
Anion gap: 9 (ref 5–15)
BUN: 10 mg/dL (ref 6–20)
CO2: 24 mmol/L (ref 22–32)
Calcium: 9.5 mg/dL (ref 8.9–10.3)
Chloride: 104 mmol/L (ref 98–111)
Creatinine: 0.8 mg/dL (ref 0.61–1.24)
GFR, Est AFR Am: >60 mL/min (ref >60)
Glucose, Bld: 113 mg/dL — ABNORMAL HIGH (ref 70–99)
Potassium: 4.2 mmol/L (ref 3.5–5.1)
Sodium: 137 mmol/L (ref 135–145)
Total Bilirubin: 0.7 mg/dL (ref 0.3–1.2)
Total Protein: 7.3 g/dL (ref 6.5–8.1)

## 2018-02-02 MED ORDER — LEUPROLIDE ACETATE (4 MONTH) 30 MG IM KIT
30.0000 mg | PACK | Freq: Once | INTRAMUSCULAR | Status: AC
  Administered 2018-02-02: 30 mg via INTRAMUSCULAR
  Filled 2018-02-02: qty 30

## 2018-02-02 NOTE — Progress Notes (Signed)
Hematology and Oncology Follow Up Visit  Anthony Villanueva [id number] [birth year] 62 y.o. 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  He presented with PSA of 293 and pelvic adenopathy.     Prior Therapy: He is status post CT-guided biopsy of retroperitoneal adenopathy on April 3 of 2019  Current therapy:  Lupron 30 mg every 4 months with the first dose given on May 31, 2017.  Xtandi 160 mg daily started in July 2019.  Interim History: Anthony Villanueva is here for a follow-up visit.  Since last visit, he reports no major changes in his health.  He continues to tolerate Xtandi without any new complaints.  He does report some mild anorexia and mild arthralgias that have been manageable.  He did lose some weight but otherwise his performance status and activity level remains unchanged.  His quality of life remains adequate at this time.   He does not report any headaches, blurry vision, syncope or seizures.  He denies any dizziness or confusion.  Does not report any fevers, chills or sweats.  Does not report any cough, wheezing or hemoptysis.  Does not report any chest pain, palpitation, orthopnea or leg edema.  Does not report any nausea, vomiting or early satiety.  Does not report any changes in bowel habits. Does not report any arthralgias or myalgias..  Does not report frequency, urgency or hematuria.  Does not report any skin rashes or lesions. Does not report any ecchymosis or petechiae.  Does not report any mood changes.  Remaining review of systems is negative.    Medications: I have reviewed the patient's current medications.  Current Outpatient Medications  Medication Sig Dispense Refill  . acetaminophen (TYLENOL) 500 MG tablet Take 500 mg by mouth every 6 (six) hours as needed for moderate pain or headache.     Marland Kitchen amLODipine (NORVASC)  10 MG tablet Take 1 tablet (10 mg total) by mouth daily. 90 tablet 3  . calcium-vitamin D (OSCAL WITH D) 500-200 MG-UNIT tablet Take 1 tablet by mouth 2 (two) times daily. (Patient not taking: Reported on 01/27/2018) 90 tablet 3  . enzalutamide (XTANDI) 40 MG capsule Take 4 capsules (160 mg total) by mouth daily. (Patient taking differently: Take 160 mg by mouth at bedtime. ) 120 capsule 0   No current facility-administered medications for this visit.      Allergies: No Known Allergies  Past Medical History, Surgical history, Social history, and Family History were reviewed and updated.    Physical Exam:  Blood pressure (!) 173/97, pulse 99, temperature 97.9 F (36.6 C), temperature source Oral, resp. rate 14, height 6' 0.5" (1.842 m), weight 156 lb 11.2 oz (71.1 kg), SpO2 100 %.    ECOG: 0   General appearance: Alert, awake without any distress. Head: Atraumatic without abnormalities Oropharynx: Without any thrush or ulcers. Eyes: No scleral icterus. Lymph nodes: No lymphadenopathy noted in the cervical, supraclavicular, or axillary nodes Heart:regular rate and rhythm, without any murmurs or gallops.   Lung: Clear to auscultation without any rhonchi, wheezes or dullness to percussion. Abdomin: Soft, nontender without any shifting dullness or ascites. Musculoskeletal: No clubbing or cyanosis. Neurological: No motor or sensory deficits. Skin: No rashes or lesions.       Lab Results: Lab Results  Component Value Date   WBC 4.9 02/02/2018   HGB 14.7 02/02/2018   HCT 43.9 02/02/2018  MCV 96.5 02/02/2018   PLT 308 02/02/2018     Chemistry      Component Value Date/Time   NA 141 12/02/2017 0847   NA 137 06/17/2017 1223   K 4.2 12/02/2017 0847   CL 104 12/02/2017 0847   CO2 26 12/02/2017 0847   BUN 10 12/02/2017 0847   BUN 8 06/17/2017 1223   CREATININE 0.76 12/02/2017 0847      Component Value Date/Time   CALCIUM 9.5 12/02/2017 0847   ALKPHOS 88 12/02/2017  0847   AST 14 (L) 12/02/2017 0847   ALT 11 12/02/2017 0847   BILITOT 0.8 12/02/2017 0847      Results for KALEEM, SARTWELL (MRN [id number]) as of 02/02/2018 08:36  Ref. Range 09/29/2017 09:18 12/02/2017 08:47  Prostate Specific Ag, Serum Latest Ref Range: 0.0 - 4.0 ng/mL 2.1 1.5    Impression and Plan:  62 year old man with:  1.    Advanced prostate cancer that is currently castration-sensitive with lymphadenopathy and bone disease diagnosed in April 2019.  He continues to tolerate Xtandi without any major complications.  Risks and benefits of continuing this therapy was reviewed today and is agreeable to continue.  His PSA continues to decline and he is a relatively asymptomatic from his disease.   2.   Androgen deprivation: Complication associated with androgen deprivation were reviewed today which include hot flashes, weight gain among others.  He will receive Lupron today will be repeated in 4 months.   3.  Bone directed therapy: Delton See has been deferred at this time till his dental surgery is complete she is tentatively scheduled for January 2020.  4.  Prognosis: His disease is incurable and treatment is palliative.  Aggressive therapy is warranted at this time.  5.  Anorexia: Appetite has declined slightly and of lost some weight.  We have discussed strategies to improve his nutritional intake and we have discussed possible using Megace in the future.  6.  Follow-up: We will be in 4 months for repeat evaluation.  15  minutes was spent with the patient face-to-face today.  More than 50% of time was dedicated to discussing his disease status, treatment options and answering question regarding plan of care.     Zola Button, MD 12/26/20198:43 AM

## 2018-02-02 NOTE — Telephone Encounter (Signed)
Printed calendar and avs. °

## 2018-02-03 LAB — PROSTATE-SPECIFIC AG, SERUM (LABCORP): Prostate Specific Ag, Serum: 2 ng/mL (ref 0.0–4.0)

## 2018-02-09 ENCOUNTER — Encounter (HOSPITAL_COMMUNITY): Payer: Self-pay | Admitting: Certified Registered Nurse Anesthetist

## 2018-02-09 ENCOUNTER — Encounter (HOSPITAL_COMMUNITY): Payer: Self-pay | Admitting: Physician Assistant

## 2018-02-09 NOTE — Pre-Procedure Instructions (Signed)
Anthony Villanueva  02/09/2018      [id number] - [place], [place] [address] [place] [address] Phone: [phone] Fax: [place], [place] [address] Phone: [phone] Fax: [phone]    Your procedure is scheduled on 02/16/2018.  Report to [facility] Admitting at [place].M.  Call this number if you have problems the morning of surgery:  [phone]   Remember:  Do not eat or drink after midnight.    Take these medicines the morning of surgery with A SIP OF WATER: Acetaminophen (Tylenol) - if needed Amlodipine (Norvasc)  7 days prior to surgery STOP taking any Aspirin (unless otherwise instructed by your surgeon), Aleve, Naproxen, Ibuprofen, Motrin, Advil, Goody's, BC's, all herbal medications, fish oil, and all vitamins.      Do not wear jewelry.  Do not wear lotions, powders, or colognes, or deodorant.  Men may shave face and neck.  Do not bring valuables to the hospital.  [facility] is not responsible for any belongings or valuables.  Contacts, eyeglasses, hearing aids, dentures or bridgework may not be worn into surgery.  Leave your suitcase in the car.  After surgery it may be brought to your room.  For patients admitted to the hospital, discharge time will be determined by your treatment team.  Patients discharged the day of surgery will not be allowed to drive home.   Name and phone number of your driver:    Special instructions:   [facility]- Preparing For Surgery  Before surgery, you can play an important role. Because skin is not sterile, your skin needs to be as free of germs as possible. You can reduce the number of germs on your skin by washing with CHG (chlorahexidine gluconate) Soap before surgery.  CHG is an antiseptic cleaner which kills germs and bonds with the skin to  continue killing germs even after washing.    Oral Hygiene is also important to reduce your risk of infection.  Remember - BRUSH YOUR TEETH THE MORNING OF SURGERY WITH YOUR REGULAR TOOTHPASTE  Please do not use if you have an allergy to CHG or antibacterial soaps. If your skin becomes reddened/irritated stop using the CHG.  Do not shave (including legs and underarms) for at least 48 hours prior to first CHG shower. It is OK to shave your face.  Please follow these instructions carefully.   1. Shower the NIGHT BEFORE SURGERY and the MORNING OF SURGERY with CHG.   2. If you chose to wash your hair, wash your hair first as usual with your normal shampoo.  3. After you shampoo, rinse your hair and body thoroughly to remove the shampoo.  4. Use CHG as you would any other liquid soap. You can apply CHG directly to the skin and wash gently with a scrungie or a clean washcloth.   5. Apply the CHG Soap to your body ONLY FROM THE NECK DOWN.  Do not use on open wounds or open sores. Avoid contact with your eyes, ears, mouth and genitals (private parts). Wash Face and genitals (private parts)  with your normal soap.  6. Wash thoroughly, paying special attention to the area where your surgery will be performed.  7. Thoroughly rinse your body with warm water from the neck down.  8. DO NOT shower/wash with your  normal soap after using and rinsing off the CHG Soap.  9. Pat yourself dry with a CLEAN TOWEL.  10. Wear CLEAN PAJAMAS to bed the night before surgery, wear comfortable clothes the morning of surgery  11. Place CLEAN SHEETS on your bed the night of your first shower and DO NOT SLEEP WITH PETS.    Day of Surgery: Shower as stated above. Do not apply any deodorants/lotions.  Please wear clean clothes to the hospital/surgery center.   Remember to brush your teeth WITH YOUR REGULAR TOOTHPASTE.    Please read over the following fact sheets that you were given.

## 2018-02-10 ENCOUNTER — Encounter (HOSPITAL_COMMUNITY)
Admission: RE | Admit: 2018-02-10 | Discharge: 2018-02-10 | Disposition: A | Discharge status: Home / Self Care | Payer: Self-pay | Source: Ambulatory Visit | Attending: Dentistry | Admitting: Dentistry

## 2018-02-10 ENCOUNTER — Other Ambulatory Visit: Payer: Self-pay

## 2018-02-10 ENCOUNTER — Encounter (HOSPITAL_COMMUNITY): Payer: Self-pay

## 2018-02-10 DIAGNOSIS — C61 Malignant neoplasm of prostate: Secondary | ICD-10-CM | POA: Insufficient documentation

## 2018-02-10 DIAGNOSIS — Z01812 Encounter for preprocedural laboratory examination: Secondary | ICD-10-CM | POA: Insufficient documentation

## 2018-02-10 DIAGNOSIS — I10 Essential (primary) hypertension: Secondary | ICD-10-CM | POA: Insufficient documentation

## 2018-02-10 DIAGNOSIS — Z79899 Other long term (current) drug therapy: Secondary | ICD-10-CM | POA: Insufficient documentation

## 2018-02-10 DIAGNOSIS — K0530 Chronic periodontitis, unspecified: Secondary | ICD-10-CM | POA: Insufficient documentation

## 2018-02-10 HISTORY — DX: Essential (primary) hypertension: I10

## 2018-02-10 NOTE — Progress Notes (Signed)
Anesthesia Chart Review:  Case:  [id number] Date/Time:  02/16/18 0700   Procedure:  MULTIPLE EXTRACTION WITH ALVEOLOPLASTY (N/A ) - NASAL TUBE   Anesthesia type:  General   Pre-op diagnosis:  PROSTATE CANCER AND PERIODONTITIS   Location:  [facility] OR ROOM 08 / [facility] OR   Surgeon:  Lenn Cal, DDS      DISCUSSION: 63 yo male former smoker. Pertinent hx includes HNT, prostate cancer metastatic to multiple bones.  Pt was referred to Dr. Harrell Gave for preop cardiac eval. Per her note 01/16/18: "Preoperative cardiovascular assessment: no active symptoms, no personal history. Can achieve >4 METs without symptoms. According to ACC/AHA guidelines, no further cardiovascular testing needed.  The patient may proceed to surgery at acceptable risk."  Pt admits to polysubstance abuse. Current every day marijuana smoker and every day ETOH user - 80oz of beer. CMP 02/02/18 shows AST and ALT are WNL. PAT nursing to call Dr. Ritta Slot office to make sure he is aware of hx.  Anticipate he can proceed as planned barring acute status change.   VS: BP (!) 155/85   Pulse 87   Temp 36.7 C   Resp 18   Ht 6' 0.5" (1.842 m)   Wt 70.8 kg   SpO2 100%   BMI 20.88 kg/m   PROVIDERS: Cammie Sickle, NP is PCP  Zola Button, MD is Oncologist  LABS: CMP and CBC 02/02/2018 reviewed and are WNL  IMAGES: CT Abd/pelvis 05/10/2017: IMPRESSION: Pelvic mass measuring 10 cm centered in the rectovesical space, inseparable from the adjacent anatomy. The etiology is favored to be prostate carcinoma, however, colon/rectal cancer or lymphoma are also considered. Referral for oncologic evaluation recommended, as well as correlation with lab values.  Pelvic and retroperitoneal lymphadenopathy, including extensive periaortic nodal mass at the level of the kidneys surrounding the vasculature.  No evidence of bowel obstruction.  Moderate stool burden.  Diverticular change without evidence of acute  diverticulitis.  Small sclerotic focus of the anterior T12 and L2 vertebral bodies as well as small sclerotic foci the bilateral iliac bones. These are nonspecific, though could potentially represent bony metastases in the setting of prostate carcinoma.   EKG: 01/16/2018: Sinus tach. Rate 115. RAE.  CV: N/A  Past Medical History:  Diagnosis Date  . Childhood asthma    "no longer gives me any problems" 02/10/18  . Hypertension     Past Surgical History:  Procedure Laterality Date  . PROSTATE BIOPSY  05/11/2017   CT core bx L retroperitoneal LAN Archie Endo 05/11/2017    MEDICATIONS: . acetaminophen (TYLENOL) 500 MG tablet  . amLODipine (NORVASC) 10 MG tablet  . calcium-vitamin D (OSCAL WITH D) 500-200 MG-UNIT tablet  . enzalutamide (XTANDI) 40 MG capsule   No current facility-administered medications for this encounter.      Wynonia Musty [facility] Short Stay Center/Anesthesiology Phone [phone] 02/10/2018 10:50 AM

## 2018-02-10 NOTE — Anesthesia Preprocedure Evaluation (Deleted)
Anesthesia Evaluation    Airway        Dental   Pulmonary former smoker,           Cardiovascular hypertension,      Neuro/Psych    GI/Hepatic   Endo/Other    Renal/GU      Musculoskeletal   Abdominal   Peds  Hematology   Anesthesia Other Findings   Reproductive/Obstetrics                             Anesthesia Physical Anesthesia Plan  ASA:   Anesthesia Plan:    Post-op Pain Management:    Induction:   PONV Risk Score and Plan:   Airway Management Planned:   Additional Equipment:   Intra-op Plan:   Post-operative Plan:   Informed Consent:   Plan Discussed with:   Anesthesia Plan Comments: (See PAT note by Laticia Vannostrand, PA-C )        Anesthesia Quick Evaluation  

## 2018-02-10 NOTE — Progress Notes (Signed)
PCP - patient does not currently have one Cardiologist - Dr. Harrell Gave, clearance in [place] on 01/16/2018  Chest x-ray - n/a EKG - 01/16/2018 Stress Test - patient denies ECHO - patient denies Cardiac Cath - patient denies  Sleep Study - patient denies CPAP -   Fasting Blood Sugar - n/a Checks Blood Sugar _____ times a day  Blood Thinner Instructions: n/a  Aspirin Instructions: n/a  Anesthesia review: yes, needed cardiac clearance; patient "smokes marijuana every day and drinks 2 40oz beers every day"  Patient denies shortness of breath, fever, cough and chest pain at PAT appointment   Patient verbalized understanding of instructions that were given to them at the PAT appointment. Patient was also instructed that they will need to review over the PAT instructions again at home before surgery.

## 2018-02-15 ENCOUNTER — Encounter (HOSPITAL_COMMUNITY): Payer: Self-pay | Admitting: Certified Registered Nurse Anesthetist

## 2018-02-16 ENCOUNTER — Ambulatory Visit (HOSPITAL_COMMUNITY): Admission: RE | Admit: 2018-02-16 | Payer: Self-pay | Source: Home / Self Care | Admitting: Dentistry

## 2018-02-16 ENCOUNTER — Telehealth: Payer: Self-pay | Admitting: Medical Oncology

## 2018-02-16 ENCOUNTER — Other Ambulatory Visit (HOSPITAL_COMMUNITY): Payer: Self-pay | Admitting: Dentistry

## 2018-02-16 ENCOUNTER — Encounter (HOSPITAL_COMMUNITY): Admission: RE | Payer: Self-pay | Source: Home / Self Care

## 2018-02-16 SURGERY — MULTIPLE EXTRACTION WITH ALVEOLOPLASTY
Anesthesia: General

## 2018-02-16 NOTE — Progress Notes (Signed)
Dr. Enrique Sack asked me to reach out to Anthony Villanueva to discuss transportation. He was scheduled for a 3 hour dental procedure today and cancelled due to no transportation. I spoke with patient and he states that the person that was bringing him, their car broke down. He did not have money for cab fare.  He is rescheduled for 1/15 at [facility] and needs to arrive at 8:00 am. He voiced understanding of date and time and states his aunt will take him. I stressed if he misses this appointment, Dr. Raliegh Ip will not do his surgery and he will need to find a new provider.   I gave him my number and asked him to call me no later than Monday 1/13 if his aunt is unable to bring him so I can help arrange for transportation.  I will follow up with patient as well.  I updated Dr. Raliegh Ip with the above.

## 2018-02-17 ENCOUNTER — Emergency Department (HOSPITAL_COMMUNITY): Payer: Medicaid Other

## 2018-02-17 ENCOUNTER — Encounter (HOSPITAL_COMMUNITY): Payer: Self-pay

## 2018-02-17 ENCOUNTER — Emergency Department (HOSPITAL_COMMUNITY)
Admission: EM | Admit: 2018-02-17 | Discharge: 2018-02-17 | Disposition: A | Discharge status: Home / Self Care | Payer: Medicaid Other | Attending: Emergency Medicine | Admitting: Emergency Medicine

## 2018-02-17 ENCOUNTER — Other Ambulatory Visit: Payer: Self-pay

## 2018-02-17 DIAGNOSIS — Y929 Unspecified place or not applicable: Secondary | ICD-10-CM | POA: Insufficient documentation

## 2018-02-17 DIAGNOSIS — Y939 Activity, unspecified: Secondary | ICD-10-CM | POA: Diagnosis not present

## 2018-02-17 DIAGNOSIS — Z23 Encounter for immunization: Secondary | ICD-10-CM | POA: Insufficient documentation

## 2018-02-17 DIAGNOSIS — Y999 Unspecified external cause status: Secondary | ICD-10-CM | POA: Diagnosis not present

## 2018-02-17 DIAGNOSIS — Z8546 Personal history of malignant neoplasm of prostate: Secondary | ICD-10-CM | POA: Insufficient documentation

## 2018-02-17 DIAGNOSIS — S61213A Laceration without foreign body of left middle finger without damage to nail, initial encounter: Secondary | ICD-10-CM | POA: Diagnosis not present

## 2018-02-17 DIAGNOSIS — I10 Essential (primary) hypertension: Secondary | ICD-10-CM | POA: Insufficient documentation

## 2018-02-17 DIAGNOSIS — Z79899 Other long term (current) drug therapy: Secondary | ICD-10-CM | POA: Insufficient documentation

## 2018-02-17 DIAGNOSIS — Z87891 Personal history of nicotine dependence: Secondary | ICD-10-CM | POA: Insufficient documentation

## 2018-02-17 MED ORDER — LIDOCAINE-EPINEPHRINE (PF) 2 %-1:200000 IJ SOLN
10.0000 mL | Freq: Once | INTRAMUSCULAR | Status: AC
  Administered 2018-02-17: 10 mL
  Filled 2018-02-17: qty 20

## 2018-02-17 MED ORDER — ACETAMINOPHEN 325 MG PO TABS
650.0000 mg | ORAL_TABLET | Freq: Once | ORAL | Status: AC
  Administered 2018-02-17: 650 mg via ORAL
  Filled 2018-02-17: qty 2

## 2018-02-17 MED ORDER — TETANUS-DIPHTH-ACELL PERTUSSIS 5-2.5-18.5 LF-MCG/0.5 IM SUSP
0.5000 mL | Freq: Once | INTRAMUSCULAR | Status: AC
  Administered 2018-02-17: 0.5 mL via INTRAMUSCULAR
  Filled 2018-02-17: qty 0.5
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
## 2018-02-17 NOTE — Discharge Instructions (Signed)
Your sutures are dissolvable and you should be able to pull them out in about 1 week.  Make sure you clean the area daily and keep it dry.
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
## 2018-02-21 ENCOUNTER — Telehealth: Payer: Self-pay | Admitting: Medical Oncology

## 2018-02-21 ENCOUNTER — Encounter (HOSPITAL_COMMUNITY): Payer: Self-pay | Admitting: *Deleted

## 2018-02-21 ENCOUNTER — Other Ambulatory Visit: Payer: Self-pay

## 2018-02-21 NOTE — Telephone Encounter (Signed)
Called patient to remind him of dental surgery in the morning. He needs to arrive at 8 am for 10 am surgery. He confirms he will ride the bus to the hospital and his aunt will take him home after surgery. I stressed if he misses this appointment, he will need to find another provider. He voiced understanding. I updated Dr. Enrique Sack.

## 2018-02-22 ENCOUNTER — Ambulatory Visit (HOSPITAL_COMMUNITY): Payer: Medicaid Other | Admitting: Anesthesiology

## 2018-02-22 ENCOUNTER — Encounter (HOSPITAL_COMMUNITY): Payer: Self-pay

## 2018-02-22 ENCOUNTER — Encounter (HOSPITAL_COMMUNITY)
Admission: RE | Disposition: A | Discharge status: Home / Self Care | Payer: Self-pay | Source: Home / Self Care | Attending: Dentistry

## 2018-02-22 ENCOUNTER — Ambulatory Visit (HOSPITAL_COMMUNITY)
Admission: RE | Admit: 2018-02-22 | Discharge: 2018-02-22 | Disposition: A | Discharge status: Home / Self Care | Payer: Medicaid Other | Attending: Dentistry | Admitting: Dentistry

## 2018-02-22 DIAGNOSIS — K045 Chronic apical periodontitis: Secondary | ICD-10-CM | POA: Diagnosis not present

## 2018-02-22 DIAGNOSIS — Z79899 Other long term (current) drug therapy: Secondary | ICD-10-CM | POA: Insufficient documentation

## 2018-02-22 DIAGNOSIS — K029 Dental caries, unspecified: Secondary | ICD-10-CM | POA: Diagnosis present

## 2018-02-22 DIAGNOSIS — K0530 Chronic periodontitis, unspecified: Secondary | ICD-10-CM

## 2018-02-22 DIAGNOSIS — I10 Essential (primary) hypertension: Secondary | ICD-10-CM | POA: Diagnosis not present

## 2018-02-22 DIAGNOSIS — C61 Malignant neoplasm of prostate: Secondary | ICD-10-CM | POA: Insufficient documentation

## 2018-02-22 DIAGNOSIS — J45909 Unspecified asthma, uncomplicated: Secondary | ICD-10-CM | POA: Insufficient documentation

## 2018-02-22 DIAGNOSIS — Z87891 Personal history of nicotine dependence: Secondary | ICD-10-CM | POA: Diagnosis not present

## 2018-02-22 DIAGNOSIS — C7951 Secondary malignant neoplasm of bone: Secondary | ICD-10-CM | POA: Insufficient documentation

## 2018-02-22 DIAGNOSIS — K0889 Other specified disorders of teeth and supporting structures: Secondary | ICD-10-CM

## 2018-02-22 HISTORY — DX: Malignant (primary) neoplasm, unspecified: C80.1

## 2018-02-22 HISTORY — PX: MULTIPLE EXTRACTIONS WITH ALVEOLOPLASTY: SHX5342

## 2018-02-22 LAB — CBC
HEMATOCRIT: 38.6 % — AB (ref 39.0–52.0)
Hemoglobin: 12.7 g/dL — ABNORMAL LOW (ref 13.0–17.0)
MCH: 32.4 pg (ref 26.0–34.0)
MCHC: 32.9 g/dL (ref 30.0–36.0)
MCV: 98.5 fL (ref 80.0–100.0)
Platelets: 276 10*3/uL (ref 150–400)
RBC: 3.92 MIL/uL — ABNORMAL LOW (ref 4.22–5.81)
RDW: 12.3 % (ref 11.5–15.5)
WBC: 4.7 10*3/uL (ref 4.0–10.5)
nRBC: 0 % (ref 0.0–0.2)

## 2018-02-22 LAB — COMPREHENSIVE METABOLIC PANEL
ALT: 15 U/L (ref 0–44)
AST: 19 U/L (ref 15–41)
Albumin: 3.3 g/dL — ABNORMAL LOW (ref 3.5–5.0)
Alkaline Phosphatase: 66 U/L (ref 38–126)
Anion gap: 11 (ref 5–15)
BUN: 11 mg/dL (ref 6–20)
CO2: 23 mmol/L (ref 22–32)
Calcium: 8.8 mg/dL — ABNORMAL LOW (ref 8.9–10.3)
Chloride: 103 mmol/L (ref 98–111)
Creatinine, Ser: 0.65 mg/dL (ref 0.61–1.24)
GFR calc Af Amer: >60 mL/min (ref >60)
GFR calc non Af Amer: >60 mL/min (ref >60)
Glucose, Bld: 98 mg/dL (ref 70–99)
Potassium: 4 mmol/L (ref 3.5–5.1)
Sodium: 137 mmol/L (ref 135–145)
Total Bilirubin: 0.9 mg/dL (ref 0.3–1.2)
Total Protein: 6.7 g/dL (ref 6.5–8.1)

## 2018-02-22 SURGERY — MULTIPLE EXTRACTION WITH ALVEOLOPLASTY
Anesthesia: General

## 2018-02-22 MED ORDER — LIDOCAINE HCL (CARDIAC) PF 100 MG/5ML IV SOSY
PREFILLED_SYRINGE | INTRAVENOUS | Status: DC | PRN
  Administered 2018-02-22: 30 mg via INTRAVENOUS

## 2018-02-22 MED ORDER — FENTANYL CITRATE (PF) 100 MCG/2ML IJ SOLN: 25.0000 ug | INTRAMUSCULAR | Status: DC | PRN

## 2018-02-22 MED ORDER — MIDAZOLAM HCL 2 MG/2ML IJ SOLN
INTRAMUSCULAR | Status: AC
  Filled 2018-02-22: qty 2

## 2018-02-22 MED ORDER — LACTATED RINGERS IV SOLN: INTRAVENOUS | Status: DC

## 2018-02-22 MED ORDER — LIDOCAINE 2% (20 MG/ML) 5 ML SYRINGE
INTRAMUSCULAR | Status: AC
  Filled 2018-02-22: qty 5

## 2018-02-22 MED ORDER — 0.9 % SODIUM CHLORIDE (POUR BTL) OPTIME
TOPICAL | Status: DC | PRN
  Administered 2018-02-22: 1000 mL

## 2018-02-22 MED ORDER — BUPIVACAINE-EPINEPHRINE (PF) 0.5% -1:200000 IJ SOLN
INTRAMUSCULAR | Status: AC
  Filled 2018-02-22: qty 5.4

## 2018-02-22 MED ORDER — ONDANSETRON HCL 4 MG/2ML IJ SOLN
INTRAMUSCULAR | Status: DC | PRN
  Administered 2018-02-22: 4 mg via INTRAVENOUS

## 2018-02-22 MED ORDER — MIDAZOLAM HCL 5 MG/5ML IJ SOLN
INTRAMUSCULAR | Status: DC | PRN
  Administered 2018-02-22: 2 mg via INTRAVENOUS

## 2018-02-22 MED ORDER — OXYCODONE-ACETAMINOPHEN 5-325 MG PO TABS: ORAL_TABLET | ORAL | 0 refills | Status: DC

## 2018-02-22 MED ORDER — ROCURONIUM BROMIDE 50 MG/5ML IV SOSY
PREFILLED_SYRINGE | INTRAVENOUS | Status: AC
  Filled 2018-02-22: qty 5

## 2018-02-22 MED ORDER — PROPOFOL 10 MG/ML IV BOLUS
INTRAVENOUS | Status: AC
  Filled 2018-02-22: qty 40

## 2018-02-22 MED ORDER — LACTATED RINGERS IV SOLN
INTRAVENOUS | Status: DC
  Administered 2018-02-22: 08:00:00 via INTRAVENOUS

## 2018-02-22 MED ORDER — LIDOCAINE-EPINEPHRINE 2 %-1:100000 IJ SOLN
INTRAMUSCULAR | Status: AC
  Filled 2018-02-22: qty 10.2

## 2018-02-22 MED ORDER — SUCCINYLCHOLINE CHLORIDE 200 MG/10ML IV SOSY
PREFILLED_SYRINGE | INTRAVENOUS | Status: AC
  Filled 2018-02-22: qty 10

## 2018-02-22 MED ORDER — CEFAZOLIN SODIUM-DEXTROSE 2-4 GM/100ML-% IV SOLN
2.0000 g | INTRAVENOUS | Status: AC
  Administered 2018-02-22: 2 g via INTRAVENOUS
  Filled 2018-02-22: qty 100

## 2018-02-22 MED ORDER — DEXAMETHASONE SODIUM PHOSPHATE 10 MG/ML IJ SOLN
INTRAMUSCULAR | Status: AC
  Filled 2018-02-22: qty 1

## 2018-02-22 MED ORDER — LACTATED RINGERS IV SOLN
INTRAVENOUS | Status: DC | PRN
  Administered 2018-02-22: 09:00:00 via INTRAVENOUS

## 2018-02-22 MED ORDER — SUGAMMADEX SODIUM 200 MG/2ML IV SOLN
INTRAVENOUS | Status: DC | PRN
  Administered 2018-02-22: 142 mg via INTRAVENOUS

## 2018-02-22 MED ORDER — OXYMETAZOLINE HCL 0.05 % NA SOLN
NASAL | Status: AC
  Filled 2018-02-22: qty 15

## 2018-02-22 MED ORDER — PROPOFOL 10 MG/ML IV BOLUS
INTRAVENOUS | Status: DC | PRN
  Administered 2018-02-22: 200 mg via INTRAVENOUS

## 2018-02-22 MED ORDER — FENTANYL CITRATE (PF) 250 MCG/5ML IJ SOLN
INTRAMUSCULAR | Status: AC
  Filled 2018-02-22: qty 5

## 2018-02-22 MED ORDER — ROCURONIUM BROMIDE 50 MG/5ML IV SOSY
PREFILLED_SYRINGE | INTRAVENOUS | Status: DC | PRN
  Administered 2018-02-22: 20 mg via INTRAVENOUS
  Administered 2018-02-22: 30 mg via INTRAVENOUS

## 2018-02-22 MED ORDER — LIDOCAINE-EPINEPHRINE 2 %-1:100000 IJ SOLN
INTRAMUSCULAR | Status: DC | PRN
  Administered 2018-02-22: 10.2 mL

## 2018-02-22 MED ORDER — FENTANYL CITRATE (PF) 100 MCG/2ML IJ SOLN
INTRAMUSCULAR | Status: DC | PRN
  Administered 2018-02-22: 50 ug via INTRAVENOUS
  Administered 2018-02-22 (×2): 100 ug via INTRAVENOUS

## 2018-02-22 MED ORDER — DEXAMETHASONE SODIUM PHOSPHATE 10 MG/ML IJ SOLN
INTRAMUSCULAR | Status: DC | PRN
  Administered 2018-02-22: 10 mg via INTRAVENOUS

## 2018-02-22 MED ORDER — OXYCODONE-ACETAMINOPHEN 5-325 MG PO TABS: 1.0000 | ORAL_TABLET | ORAL | Status: DC | PRN

## 2018-02-22 MED ORDER — SUCCINYLCHOLINE CHLORIDE 20 MG/ML IJ SOLN
INTRAMUSCULAR | Status: DC | PRN
  Administered 2018-02-22: 100 mg via INTRAVENOUS

## 2018-02-22 MED ORDER — ONDANSETRON HCL 4 MG/2ML IJ SOLN
INTRAMUSCULAR | Status: AC
  Filled 2018-02-22: qty 2

## 2018-02-22 MED ORDER — BUPIVACAINE-EPINEPHRINE 0.5% -1:200000 IJ SOLN
INTRAMUSCULAR | Status: DC | PRN
  Administered 2018-02-22: 5.4 mL

## 2018-02-22 SURGICAL SUPPLY — 42 items
ALCOHOL 70% 16 OZ (MISCELLANEOUS) ×3 IMPLANT
ATTRACTOMAT 16X20 MAGNETIC DRP (DRAPES) ×3 IMPLANT
BANDAGE HEMOSTAT MRDH 4X4 STRL (MISCELLANEOUS) IMPLANT
BLADE SURG 15 STRL LF DISP TIS (BLADE) ×2 IMPLANT
BLADE SURG 15 STRL SS (BLADE) ×6
BNDG HEMOSTAT MRDH 4X4 STRL (MISCELLANEOUS)
COVER SURGICAL LIGHT HANDLE (MISCELLANEOUS) ×1 IMPLANT
COVER WAND RF STERILE (DRAPES) ×1 IMPLANT
GAUZE 4X4 16PLY RFD (DISPOSABLE) ×3 IMPLANT
GAUZE PACKING FOLDED 2  STR (GAUZE/BANDAGES/DRESSINGS) ×2
GAUZE PACKING FOLDED 2 STR (GAUZE/BANDAGES/DRESSINGS) ×1 IMPLANT
GLOVE BIO SURGEON STRL SZ 6.5 (GLOVE) ×2 IMPLANT
GLOVE BIO SURGEONS STRL SZ 6.5 (GLOVE) ×1
GLOVE SURG ORTHO 8.0 STRL STRW (GLOVE) ×3 IMPLANT
GOWN STRL REUS W/ TWL LRG LVL3 (GOWN DISPOSABLE) ×1 IMPLANT
GOWN STRL REUS W/TWL 2XL LVL3 (GOWN DISPOSABLE) ×3 IMPLANT
GOWN STRL REUS W/TWL LRG LVL3 (GOWN DISPOSABLE) ×3
HEMOSTAT SURGICEL 2X14 (HEMOSTASIS) IMPLANT
KIT BASIN OR (CUSTOM PROCEDURE TRAY) ×3 IMPLANT
KIT TURNOVER KIT B (KITS) ×3 IMPLANT
MANIFOLD NEPTUNE WASTE (CANNULA) ×3 IMPLANT
NDL BLUNT 16X1.5 OR ONLY (NEEDLE) IMPLANT
NDL DENTAL 27 LONG (NEEDLE) IMPLANT
NEEDLE BLUNT 16X1.5 OR ONLY (NEEDLE) ×6 IMPLANT
NEEDLE DENTAL 27 LONG (NEEDLE) ×3 IMPLANT
NS IRRIG 1000ML POUR BTL (IV SOLUTION) ×3 IMPLANT
PACK EENT II TURBAN DRAPE (CUSTOM PROCEDURE TRAY) ×3 IMPLANT
PAD ARMBOARD 7.5X6 YLW CONV (MISCELLANEOUS) ×3 IMPLANT
SPONGE SURGIFOAM ABS GEL 100 (HEMOSTASIS) IMPLANT
SPONGE SURGIFOAM ABS GEL 12-7 (HEMOSTASIS) IMPLANT
SPONGE SURGIFOAM ABS GEL SZ50 (HEMOSTASIS) IMPLANT
SUCTION FRAZIER HANDLE 10FR (MISCELLANEOUS) ×2
SUCTION TUBE FRAZIER 10FR DISP (MISCELLANEOUS) ×1 IMPLANT
SUT CHROMIC 3 0 PS 2 (SUTURE) ×10 IMPLANT
SUT CHROMIC 4 0 P 3 18 (SUTURE) IMPLANT
SYR 50ML SLIP (SYRINGE) ×3 IMPLANT
TOWEL NATURAL 10PK STERILE (DISPOSABLE) ×3 IMPLANT
TUBE CONNECTING 12'X1/4 (SUCTIONS) ×1
TUBE CONNECTING 12X1/4 (SUCTIONS) ×2 IMPLANT
WATER STERILE IRR 1000ML POUR (IV SOLUTION) ×3 IMPLANT
WATER TABLETS ICX (MISCELLANEOUS) ×3 IMPLANT
YANKAUER SUCT BULB TIP NO VENT (SUCTIONS) ×3 IMPLANT

## 2018-02-22 NOTE — H&P (Signed)
02/22/2018  Patient:            Anthony Villanueva Date of Birth:  02/01/1956 MRN:                [id number]  Anthony Villanueva presents for multiple dental extractions with alveoloplasty in the operating room with general anesthesia.  Patient denies any acute medical or dental changes.  Please see note from Dr. Alen Blew dated 02/02/2018 to act as H&P for the dental operating room procedure.  Lenn Cal, DDS   Hematology and Oncology Follow Up Visit  Anthony Villanueva [id number] Apr 18, 1955 63 y.o. 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  He presented with PSA of 293 and pelvic adenopathy.     Prior Therapy: He is status post CT-guided biopsy of retroperitoneal adenopathy on April 3 of 2019  Current therapy:  Lupron 30 mg every 4 months with the first dose given on May 31, 2017.  Xtandi 160 mg daily started in July 2019.  Interim History: Mr. Dehner is here for a follow-up visit.  Since last visit, he reports no major changes in his health.  He continues to tolerate Xtandi without any new complaints.  He does report some mild anorexia and mild arthralgias that have been manageable.  He did lose some weight but otherwise his performance status and activity level remains unchanged.  His quality of life remains adequate at this time.   He does not report any headaches, blurry vision, syncope or seizures.  He denies any dizziness or confusion.  Does not report any fevers, chills or sweats. Does not report any cough, wheezing or hemoptysis. Does not report any chest pain, palpitation, orthopnea or leg edema. Does not report any nausea, vomiting or early satiety. Does not report any changes in bowel habits.Does not report any arthralgias or myalgias..  Does not report frequency, urgency or hematuria.  Does not report any skin  rashes or lesions. Does not report any ecchymosis or petechiae.  Does not report any mood changes.  Remaining review of systems is negative.    Medications: I have reviewed the patient's current medications.        Current Outpatient Medications  Medication Sig Dispense Refill  . acetaminophen (TYLENOL) 500 MG tablet Take 500 mg by mouth every 6 (six) hours as needed for moderate pain or headache.     Marland Kitchen amLODipine (NORVASC) 10 MG tablet Take 1 tablet (10 mg total) by mouth daily. 90 tablet 3  . calcium-vitamin D (OSCAL WITH D) 500-200 MG-UNIT tablet Take 1 tablet by mouth 2 (two) times daily. (Patient not taking: Reported on 01/27/2018) 90 tablet 3  . enzalutamide (XTANDI) 40 MG capsule Take 4 capsules (160 mg total) by mouth daily. (Patient taking differently: Take 160 mg by mouth at bedtime. ) 120 capsule 0   No current facility-administered medications for this visit.      Allergies: No Known Allergies  Past Medical History, Surgical history, Social history, and Family History were reviewed and updated.    Physical Exam:  Blood pressure (!) 173/97, pulse 99, temperature 97.9 F (36.6 C), temperature source Oral, resp. rate 14, height 6' 0.5" (1.842 m), weight 156 lb 11.2 oz (71.1 kg), SpO2 100 %.    ECOG: 0   General appearance:Alert, awake without any distress. Head:Atraumatic without abnormalities Oropharynx: Without any thrush or ulcers. Eyes: No scleral icterus.  Lymph nodes:No lymphadenopathy noted in the cervical, supraclavicular,oraxillary nodes Heart:regular rate and rhythm,without any murmurs or gallops.  Lung:Clear to auscultation without any rhonchi, wheezes or dullness to percussion. Abdomin:Soft, nontender without any shifting dullness or ascites. Musculoskeletal: No clubbing or cyanosis. Neurological: No motor or sensory deficits. Skin: No rashes or lesions.       Lab Results: RecentLabs       Lab Results  Component  Value Date   WBC 4.9 02/02/2018   HGB 14.7 02/02/2018   HCT 43.9 02/02/2018   MCV 96.5 02/02/2018   PLT 308 02/02/2018       Chemistry   Labs(Brief)          Component Value Date/Time   NA 141 12/02/2017 0847   NA 137 06/17/2017 1223   K 4.2 12/02/2017 0847   CL 104 12/02/2017 0847   CO2 26 12/02/2017 0847   BUN 10 12/02/2017 0847   BUN 8 06/17/2017 1223   CREATININE 0.76 12/02/2017 0847     Labs(Brief)          Component Value Date/Time   CALCIUM 9.5 12/02/2017 0847   ALKPHOS 88 12/02/2017 0847   AST 14 (L) 12/02/2017 0847   ALT 11 12/02/2017 0847   BILITOT 0.8 12/02/2017 0847        Results for LEVOY, GEISEN (MRN [id number]) as of 02/02/2018 08:36  Ref. Range 09/29/2017 09:18 12/02/2017 08:47  Prostate Specific Ag, Serum Latest Ref Range: 0.0 - 4.0 ng/mL 2.1 1.5    Impression and Plan:  63 year old man with:  1.   Advanced prostate cancer that is currently castration-sensitive with lymphadenopathy and bone disease diagnosed in April 2019.  He continues to tolerate Xtandi without any major complications.  Risks and benefits of continuing this therapy was reviewed today and is agreeable to continue.  His PSA continues to decline and he is a relatively asymptomatic from his disease.   2.  Androgen deprivation: Complication associated with androgen deprivation were reviewed today which include hot flashes, weight gain among others.  He will receive Lupron today will be repeated in 4 months.   3. Bone directed therapy: Delton See has been deferred at this time till his dental surgery is complete she is tentatively scheduled for January 2020.  4. Prognosis: His disease is incurable and treatment is palliative.  Aggressive therapy is warranted at this time.  5.  Anorexia: Appetite has declined slightly and of lost some weight.  We have discussed strategies to improve his nutritional intake and we have discussed possible using  Megace in the future.  6. Follow-up: We will be in 4 months for repeat evaluation.  53minutes was spent with the patient face-to-face today. More than 50% of time was dedicated to discussing his disease status, treatment options and answering question regarding plan of care.     Zola Button, MD 12/26/20198:43 AM        Electronically signed by Wyatt Portela, MD at 02/02/2018 8:49 AM

## 2018-02-22 NOTE — Anesthesia Procedure Notes (Signed)
Procedure Name: Intubation Performed by: Eligha Bridegroom, CRNA Pre-anesthesia Checklist: Patient identified, Emergency Drugs available, Suction available and Patient being monitored Patient Re-evaluated:Patient Re-evaluated prior to induction Oxygen Delivery Method: Circle System Utilized Preoxygenation: Pre-oxygenation with 100% oxygen Induction Type: IV induction Ventilation: Mask ventilation without difficulty Laryngoscope Size: Glidescope Grade View: Grade I Nasal Tubes: Nasal Rae, Nasal prep performed, Left and Magill forceps- large, utilized Number of attempts: 1 Placement Confirmation: ETT inserted through vocal cords under direct vision,  positive ETCO2 and breath sounds checked- equal and bilateral Tube secured with: Tape Dental Injury: Teeth and Oropharynx as per pre-operative assessment

## 2018-02-22 NOTE — Op Note (Addendum)
OPERATIVE REPORT  Patient:            Anthony Villanueva Date of Birth:  1955-06-12 MRN:                [id number]   DATE OF PROCEDURE:  02/22/2018  PREOPERATIVE DIAGNOSES: 1.  Prostate cancer 2.  Bone metastases 3.  Pre-Xgeva therapy dental protocol 4.  Chronic apical periodontitis 5.  Chronic periodontitis 6.  Dental caries 7.  Tooth mobility  POSTOPERATIVE DIAGNOSES: 1.  Prostate cancer 2.  Bone metastases 3.  Pre-Xgeva therapy dental protocol 4.  Chronic apical periodontitis 5.  Chronic periodontitis 6.  Dental caries 7.  Tooth mobility  OPERATIONS: 1. Multiple extraction of tooth numbers 1, 3-7, 11-14, 19-22, 25-27, and 29-32. 2. 4 Quadrants of alveoloplasty   SURGEON: Lenn Cal, DDS  ASSISTANT: Molli Posey (dental assistant)  ANESTHESIA: General anesthesia via nasoendotracheal tube.  MEDICATIONS: 1. Ancef 2 g IV prior to invasive dental procedures. 2. Local anesthesia with a total utilization of 6 carpules each containing 34 mg of lidocaine with 0.017 mg of epinephrine as well as 3 carpules each containing 9 mg of bupivacaine with 0.009 mg of epinephrine.  SPECIMENS: There are 21 teeth that were discarded.  DRAINS: None  CULTURES: None  COMPLICATIONS: None  ESTIMATED BLOOD LOSS: 150 mLs.  INTRAVENOUS FLUIDS: 900 mLs of Lactated ringers solution.  INDICATIONS: The patient was previously diagnosed with prostate cancer with bone metastases.  Patient with anticipated use of Xgeva therapy.  A medically necessary dental consultation was then requested to evaluate poor dentition prior to use of Xgeva therapy.  The patient was examined and treatment planned for multiple extractions with alveoloplasty in the operating room with general anesthesia.  This treatment plan was formulated to decrease the risks and complications associated with dental infection from affecting the patient's systemic health and to prevent future complications such as  osteonecrosis of the jaw with invasive dental procedures secondary to [facility] therapy.  OPERATIVE FINDINGS: Patient was examined operating room number 18.  The teeth were identified for extraction. The patient was noted be affected by chronic periodontitis, loose teeth, chronic apical periodontitis, and dental caries.   DESCRIPTION OF PROCEDURE: Patient was brought to the main operating room number 18. Patient was then placed in the supine position on the operating table. General anesthesia was then induced per the anesthesia team. The patient was then prepped and draped in the usual manner for dental medicine procedure. A timeout was performed. The patient was identified and procedures were verified. A throat pack was placed at this time. The oral cavity was then thoroughly examined with the findings noted above. The patient was then ready for dental medicine procedure as follows:  Local anesthesia was then administered sequentially with a total utilization of 6 carpules each containing 34 mg of lidocaine with 0.017 mg of epinephrine as well as 3 carpules  each containing 9 mg bupivacaine with 0.009 mg of epinephrine.  The Maxillary left and right quadrants first approached. Anesthesia was then delivered utilizing infiltration with lidocaine with epinephrine. A #15 blade incision was then made from the maxillary right tuberosity and extended to the distal of #15.  A  surgical flap was then carefully reflected. The maxillary teeth were then subluxated with a series of straight elevators. Tooth numbers 1, 3-7, and 11-14 were then removed with a 150 forceps without complications. Alveoloplasty was then performed utilizing a ronguers and bone file to help achieve primary closure. The surgical sites were  then irrigated with copious amounts of sterile saline.  The tissues were approximated and trimmed appropriately. The maxillary right surgical site was then closed from the maxillary tuberosity and extended to  the mesial #8 utilizing 3-0 Chromic Gut suture in a continuous interrupted suture technique x1.  The maxillary left surgical site was then closed from the distal #15 extended the mesial of #9 utilizing 3-0 Chromic Gut suture in a continuous interrupted suture technique x1.  At this point in time, the mandibular quadrants were approached. The patient was given bilateral inferior alveolar nerve blocks and long buccal nerve blocks utilizing the bupivacaine with epinephrine. Further infiltration was then achieved utilizing the lidocaine with epinephrine. A 15 blade incision was then made from the distal of number 19 and extended the distal of #32.  A surgical flap was then carefully reflected.  The mandibular teeth were then subluxated with a series of straight elevators.  Tooth numbers 19-22, 25-27, and 29-32  were then removed with a 151 forceps without complications.  Alveoloplasty was then performed utilizing a rongeurs and bone file to help achieve primary closure. The tissues were approximated and trimmed appropriately. The surgical sites were then irrigated with copious amounts of sterile saline. The mandibular left surgical site was then closed from the distal of 19 extended the mesial #24 utilizing 3-0 Chromic Gut suture in a continuous interrupted suture technique x1.  The mandibular right surgical site was then closed in the distal #32 and extended to the mesial of #25 utilizing 3-0 Chromic Gut suture in a continuous interrupted suture technique x1.  2 individual interrupted sutures were then placed further close surgical site utilizing 3-0 Chromic Gut material.    At this point in time, the entire mouth was irrigated with copious amounts of sterile saline. The patient was examined for complications, seeing none, the dental medicine procedure was deemed to be complete. The throat pack was removed at this time. An oral airway was then placed at the request of the anesthesia team. A series of 4 x 4 gauze  were placed in the mouth to aid hemostasis. The patient was then handed over to the anesthesia team for final disposition. After an appropriate amount of time, the patient was extubated and taken to the postanesthsia care unit in good condition. All counts were correct for the dental medicine procedure.   Lenn Cal, DDS.

## 2018-02-22 NOTE — Transfer of Care (Signed)
Immediate Anesthesia Transfer of Care Note  Patient: Anthony Villanueva  Procedure(s) Performed: MULTIPLE EXTRACTION WITH ALVEOLOPLASTY (N/A )  Patient Location: PACU  Anesthesia Type:General  Level of Consciousness: awake, alert  and oriented  Airway & Oxygen Therapy: Patient Spontanous Breathing  Post-op Assessment: Report given to RN and Post -op Vital signs reviewed and stable  Post vital signs: Reviewed and stable  Last Vitals:  Vitals Value Taken Time  BP 160/84 02/22/2018 11:45 AM  Temp 36.6 C 02/22/2018 11:45 AM  Pulse 92 02/22/2018 11:47 AM  Resp 14 02/22/2018 11:47 AM  SpO2 96 % 02/22/2018 11:47 AM  Vitals shown include unvalidated device data.  Last Pain:  Vitals:   02/22/18 1145  TempSrc:   PainSc: 0-No pain         Complications: No apparent anesthesia complications

## 2018-02-22 NOTE — Anesthesia Preprocedure Evaluation (Signed)
Anesthesia Evaluation  Patient identified by MRN, date of birth, ID band  Reviewed: Allergy & Precautions, NPO status , Patient's Chart, lab work & pertinent test results  Airway Mallampati: II  TM Distance: >3 FB     Dental   Pulmonary asthma , former smoker,    breath sounds clear to auscultation       Cardiovascular hypertension,  Rhythm:Regular Rate:Normal     Neuro/Psych    GI/Hepatic negative GI ROS, Neg liver ROS,   Endo/Other  negative endocrine ROS  Renal/GU negative Renal ROS     Musculoskeletal   Abdominal   Peds  Hematology   Anesthesia Other Findings   Reproductive/Obstetrics                             Anesthesia Physical Anesthesia Plan  ASA: III  Anesthesia Plan: General   Post-op Pain Management:    Induction: Intravenous  PONV Risk Score and Plan: Treatment may vary due to age or medical condition  Airway Management Planned: Nasal ETT  Additional Equipment:   Intra-op Plan:   Post-operative Plan: Extubation in OR  Informed Consent: I have reviewed the patients History and Physical, chart, labs and discussed the procedure including the risks, benefits and alternatives for the proposed anesthesia with the patient or authorized representative who has indicated his/her understanding and acceptance.     Dental advisory given  Plan Discussed with: CRNA and Anesthesiologist  Anesthesia Plan Comments:         Anesthesia Quick Evaluation

## 2018-02-22 NOTE — Discharge Instructions (Signed)

## 2018-02-22 NOTE — Anesthesia Postprocedure Evaluation (Signed)
Anesthesia Post Note  Patient: Anthony Villanueva  Procedure(s) Performed: MULTIPLE EXTRACTION WITH ALVEOLOPLASTY (N/A )     Patient location during evaluation: PACU Anesthesia Type: General Level of consciousness: awake Pain management: pain level controlled Vital Signs Assessment: post-procedure vital signs reviewed and stable Cardiovascular status: stable Postop Assessment: no apparent nausea or vomiting Anesthetic complications: no    Last Vitals:  Vitals:   02/22/18 1203 02/22/18 1230  BP:  (!) 162/87  Pulse: 90 89  Resp:    Temp:    SpO2: 95% 95%    Last Pain:  Vitals:   02/22/18 1200  TempSrc:   PainSc: Asleep                 Woody Kronberg

## 2018-02-22 NOTE — Progress Notes (Signed)
PRE-OPERATIVE NOTE:  02/22/2018 Anthony Villanueva [id number]  VITALS: BP (!) 164/87   Pulse 83   Temp 97.7 F (36.5 C) (Oral)   Resp 18   SpO2 99%   Lab Results  Component Value Date   WBC 4.7 02/22/2018   HGB 12.7 (L) 02/22/2018   HCT 38.6 (L) 02/22/2018   MCV 98.5 02/22/2018   PLT 276 02/22/2018   BMET    Component Value Date/Time   NA 137 02/22/2018 0752   NA 137 06/17/2017 1223   K 4.0 02/22/2018 0752   CL 103 02/22/2018 0752   CO2 23 02/22/2018 0752   GLUCOSE 98 02/22/2018 0752   BUN 11 02/22/2018 0752   BUN 8 06/17/2017 1223   CREATININE 0.65 02/22/2018 0752   CREATININE 0.80 02/02/2018 0800   CALCIUM 8.8 (L) 02/22/2018 0752   GFRNONAA >60 02/22/2018 0752   GFRNONAA >60 02/02/2018 0800   GFRAA >60 02/22/2018 0752   GFRAA >60 02/02/2018 0800    Lab Results  Component Value Date   INR 1.02 06/03/2017   INR 1.06 05/11/2017   No results found for: PTT   Anthony Villanueva presents for multiple dental extractions with alveoloplasty in the operating room and general anesthesia.    SUBJECTIVE: The patient denies any acute medical or dental changes and agrees to proceed with treatment as planned.  EXAM: No sign of acute dental changes.  ASSESSMENT: Patient is affected by chronic apical periodontitis, retained root segments, chronic periodontitis, severe bone loss, and loose teeth.  PLAN: Patient agrees to proceed with treatment as planned in the operating room as previously discussed and accepts the risks, benefits, and complications of the proposed treatment. Patient is aware of the risk for bleeding, bruising, swelling, infection, pain, nerve damage, soft tissue damage, sinus involvement, root tip fracture, mandible fracture, and the risks of complications associated with the anesthesia. Patient also is aware of the potential for other complications not mentioned above.  Patient is aware that the impacted tooth #17 will not be extracted at this time.  Patient will  need follow-up with an oral surgeon for evaluation for extraction of this tooth as needed.  Lenn Cal, DDS

## 2018-02-23 ENCOUNTER — Encounter (HOSPITAL_COMMUNITY): Payer: Self-pay | Admitting: Dentistry

## 2018-02-27 ENCOUNTER — Ambulatory Visit: Payer: Self-pay

## 2018-03-06 ENCOUNTER — Ambulatory Visit (HOSPITAL_COMMUNITY): Payer: Self-pay | Admitting: Dentistry

## 2018-03-07 ENCOUNTER — Ambulatory Visit: Payer: Self-pay | Admitting: Nurse Practitioner

## 2018-03-07 ENCOUNTER — Encounter (HOSPITAL_COMMUNITY): Payer: Self-pay | Admitting: Dentistry

## 2018-03-07 ENCOUNTER — Encounter (HOSPITAL_COMMUNITY): Payer: Self-pay

## 2018-03-07 ENCOUNTER — Emergency Department (HOSPITAL_COMMUNITY)
Admission: EM | Admit: 2018-03-07 | Discharge: 2018-03-07 | Disposition: A | Discharge status: Home / Self Care | Payer: Medicaid Other | Attending: Emergency Medicine | Admitting: Emergency Medicine

## 2018-03-07 ENCOUNTER — Other Ambulatory Visit: Payer: Self-pay

## 2018-03-07 ENCOUNTER — Ambulatory Visit (HOSPITAL_COMMUNITY): Payer: Self-pay | Admitting: Dentistry

## 2018-03-07 VITALS — BP 148/85 | HR 92 | Temp 98.4°F

## 2018-03-07 DIAGNOSIS — S61219D Laceration without foreign body of unspecified finger without damage to nail, subsequent encounter: Secondary | ICD-10-CM | POA: Diagnosis not present

## 2018-03-07 DIAGNOSIS — K08199 Complete loss of teeth due to other specified cause, unspecified class: Secondary | ICD-10-CM

## 2018-03-07 DIAGNOSIS — Z87891 Personal history of nicotine dependence: Secondary | ICD-10-CM | POA: Insufficient documentation

## 2018-03-07 DIAGNOSIS — C61 Malignant neoplasm of prostate: Secondary | ICD-10-CM

## 2018-03-07 DIAGNOSIS — K011 Impacted teeth: Secondary | ICD-10-CM

## 2018-03-07 DIAGNOSIS — X58XXXD Exposure to other specified factors, subsequent encounter: Secondary | ICD-10-CM | POA: Insufficient documentation

## 2018-03-07 DIAGNOSIS — S6990XD Unspecified injury of unspecified wrist, hand and finger(s), subsequent encounter: Secondary | ICD-10-CM | POA: Diagnosis present

## 2018-03-07 DIAGNOSIS — Z79899 Other long term (current) drug therapy: Secondary | ICD-10-CM | POA: Diagnosis not present

## 2018-03-07 DIAGNOSIS — Z4802 Encounter for removal of sutures: Secondary | ICD-10-CM | POA: Diagnosis not present

## 2018-03-07 DIAGNOSIS — I10 Essential (primary) hypertension: Secondary | ICD-10-CM | POA: Diagnosis not present

## 2018-03-07 DIAGNOSIS — K0820 Unspecified atrophy of edentulous alveolar ridge: Secondary | ICD-10-CM

## 2018-03-07 DIAGNOSIS — Z01818 Encounter for other preprocedural examination: Secondary | ICD-10-CM

## 2018-03-07 DIAGNOSIS — C7951 Secondary malignant neoplasm of bone: Secondary | ICD-10-CM

## 2018-03-07 NOTE — ED Notes (Signed)
6 dried sutures removed without difficulty. Pt encouraged to  Continue to was with warm soapy watter

## 2018-03-07 NOTE — ED Notes (Signed)
Bed: WTR6 Expected date:  Expected time:  Means of arrival:  Comments: 

## 2018-03-07 NOTE — Progress Notes (Addendum)
POST OPERATIVE NOTE:  03/07/2018 Anthony Villanueva [id number]  VITALS: BP (!) 148/85 (BP Location: Left Arm)   Pulse 92   Temp 98.4 F (36.9 C)   LABS:  Lab Results  Component Value Date   WBC 4.7 02/22/2018   HGB 12.7 (L) 02/22/2018   HCT 38.6 (L) 02/22/2018   MCV 98.5 02/22/2018   PLT 276 02/22/2018   BMET    Component Value Date/Time   NA 137 02/22/2018 0752   NA 137 06/17/2017 1223   K 4.0 02/22/2018 0752   CL 103 02/22/2018 0752   CO2 23 02/22/2018 0752   GLUCOSE 98 02/22/2018 0752   BUN 11 02/22/2018 0752   BUN 8 06/17/2017 1223   CREATININE 0.65 02/22/2018 0752   CREATININE 0.80 02/02/2018 0800   CALCIUM 8.8 (L) 02/22/2018 0752   GFRNONAA >60 02/22/2018 0752   GFRNONAA >60 02/02/2018 0800   GFRAA >60 02/22/2018 0752   GFRAA >60 02/02/2018 0800    Lab Results  Component Value Date   INR 1.02 06/03/2017   INR 1.06 05/11/2017   No results found for: PTT   Anthony Villanueva is status post multiple extractions with alveoloplasty and pre-prosthetic surgery as needed in the operating room with general anesthesia. Patient now presents for evaluation of healing and suture removal. Orthopantogram also taken for follow-up evaluation of impacted third molar #17  SUBJECTIVE: Patient with minimal complaints. Patient denies any active bleeding. Patient is eating a soft diet.  EXAM: There is no sign of infection, heme, or ooze. Sutures are loosely intact. Patient is healing in by generalized primary closure with several areas of the upper right and upper left molar's healing in by secondary intention.  Patient is clinically edentulous. Patient, however, has impacted third molar in the area of #17.  PROCEDURE: The patient was given a chlorhexidine gluconate rinse for 30 seconds. Sutures were then removed without complication. Patient tolerated the procedure well.  Orthopantogram taken: Extraction of all remaining teeth noted with the exception of the impacted tooth #17. Tooth  #17 is a full bony horizontal impaction. There is evidence of recent dental extractions.   ASSESSMENT: Post operative course is consistent with dental procedures performed in the operating room with general anesthesia Loss of teeth due to extraction Presence of impacted tooth #17.  PLAN: 1. Continue salt water rinses as needed to aid healing. 2. Advance diet as tolerated. 3. I recommended follow-up with an oral surgeon for evaluation for extraction of the impacted tooth #17. Patient is currently adamant about not having this tooth extracted.  Patient was made aware of the significant risks associated with extraction of this tooth after future doses of Xgeva therapy. Patient expressed understanding. 4.  Follow-up with the dentist of his choice for fabrication of upper and lower complete dentures after adequate healing in 4-6 weeks. 5. Dr. Alen Blew to proceed with Delton See therapy after another month of healing or earlier if needed for clinical reasons.   Anthony Villanueva, DDS

## 2018-03-07 NOTE — ED Triage Notes (Signed)
Patient states he had sutures placed to the left mid finger and states that he was told to come and have sutures removed today. No redness or edema noted.

## 2018-03-07 NOTE — ED Provider Notes (Signed)
[place] DEPT Provider Note   CSN: [id number] Arrival date & time: 03/07/18  0957     History   Chief Complaint Chief Complaint  Patient presents with  . Suture / Staple Removal    HPI Anthony Villanueva is a 63 y.o. male.  The history is provided by the patient. No language interpreter was used.  Suture / Staple Removal  This is a new problem. The current episode started more than 1 week ago. The problem occurs constantly. The problem has not changed since onset.Nothing aggravates the symptoms. Nothing relieves the symptoms.  Pt reports he has had sutures for 2 weeks.  Pt denies any current complaints  Past Medical History:  Diagnosis Date  . Cancer [facility])    prostate  . Childhood asthma    "no longer gives me any problems" 02/10/18  . Hypertension     Patient Active Problem List   Diagnosis Date Noted  . Prostate cancer ([place]) 05/19/2017  . Pelvic mass 05/10/2017  . Constipation 05/10/2017  . Weight loss, unintentional 05/10/2017  . Hematuria 05/10/2017    Past Surgical History:  Procedure Laterality Date  . MULTIPLE EXTRACTIONS WITH ALVEOLOPLASTY N/A 02/22/2018   Procedure: MULTIPLE EXTRACTION WITH ALVEOLOPLASTY;  Surgeon: Lenn Cal, DDS;  Location: [place];  Service: Oral Surgery;  Laterality: N/A;  NASAL TUBE  . PROSTATE BIOPSY  05/11/2017   CT core bx L retroperitoneal LAN Archie Endo 05/11/2017        Home Medications    Prior to Admission medications   Medication Sig Start Date End Date Taking? Authorizing Provider  acetaminophen (TYLENOL) 500 MG tablet Take 500 mg by mouth every 6 (six) hours as needed for moderate pain or headache.     [provider]  amLODipine (NORVASC) 10 MG tablet Take 1 tablet (10 mg total) by mouth daily. 01/16/18 01/11/19  Buford Dresser, MD  calcium-vitamin D (OSCAL WITH D) 500-200 MG-UNIT tablet Take 1 tablet by mouth 2 (two) times daily. 12/02/17   Wyatt Portela, MD  enzalutamide  Gillermina Phy) 40 MG capsule Take 4 capsules (160 mg total) by mouth daily. Patient taking differently: Take 160 mg by mouth at bedtime.  01/26/18   Wyatt Portela, MD    Family History Family History  Problem Relation Age of Onset  . Cancer Mother     Social History Social History   Tobacco Use  . Smoking status: Former Smoker    Packs/day: 0.25    Years: 43.00    Pack years: 10.75    Types: Cigarettes    Last attempt to quit: 02/08/2018    Years since quitting: 0.0  . Smokeless tobacco: Never Used  Substance Use Topics  . Alcohol use: Yes    Comment: 02/10/2018 "2 - 40oz beer/day"  . Drug use: Yes    Frequency: 7.0 times per week    Types: Marijuana    Comment: daily use     Allergies   Patient has no known allergies.   Review of Systems Review of Systems  All other systems reviewed and are negative.    Physical Exam Updated Vital Signs BP (!) 166/90   Pulse 92   Temp 98.6 F (37 C) (Oral)   Resp 18   Ht 6\' 1"  (1.854 m)   Wt 71 kg   SpO2 99%   BMI 20.65 kg/m   Physical Exam Vitals signs reviewed.  Musculoskeletal: Normal range of motion.     Comments: From finger, healed  laceration base of 3rd finger nv and ns intact  Skin:    General: Skin is warm and dry.  Neurological:     General: No focal deficit present.     Mental Status: He is alert.  Psychiatric:        Mood and Affect: Mood normal.      ED Treatments / Results  Labs (all labs ordered are listed, but only abnormal results are displayed) Labs Reviewed - No data to display  EKG None  Radiology No results found.  Procedures Procedures (including critical care time)  Medications Ordered in ED Medications - No data to display   Initial Impression / Assessment and Plan / ED Course  I have reviewed the triage vital signs and the nursing notes.  Pertinent labs & imaging results that were available during my care of the patient were reviewed by me and considered in my medical  decision making (see chart for details).     *  Final Clinical Impressions(s) / ED Diagnoses   Final diagnoses:  Visit for suture removal    ED Discharge Orders    None    An After Visit Summary was printed and given to the patient.    Sidney Ace 03/07/18 1351    Carmin Muskrat, MD 03/08/18 [phone]

## 2018-03-07 NOTE — Addendum Note (Signed)
Addendum  created 03/07/18 0957 by Belinda Block, MD   Intraprocedure Staff edited

## 2018-03-07 NOTE — Patient Instructions (Signed)
PLAN: 1. Continue salt water rinses as needed to aid healing. 2. Advance diet as tolerated. 3. I recommended follow-up with an oral surgeon for evaluation for extraction of the impacted tooth #17. Patient is currently adamant about not having this tooth extracted.  Patient was made aware of the significant risks associated with extraction of this tooth after future doses of Xgeva therapy. Patient expressed understanding. 4.  Follow-up with the dentist of his choice for fabrication of upper and lower complete dentures after adequate healing in 4-6 weeks. 5. Dr. Alen Blew to proceed with Delton See therapy after another month of healing or earlier if needed for clinical reasons.   Lenn Cal, DDS

## 2018-03-09 ENCOUNTER — Ambulatory Visit: Payer: Self-pay

## 2018-03-28 ENCOUNTER — Other Ambulatory Visit: Payer: Self-pay

## 2018-03-28 DIAGNOSIS — C61 Malignant neoplasm of prostate: Secondary | ICD-10-CM

## 2018-03-28 MED ORDER — ENZALUTAMIDE 40 MG PO CAPS: 160.0000 mg | ORAL_CAPSULE | Freq: Every day | ORAL | 0 refills | Status: DC

## 2018-04-17 ENCOUNTER — Ambulatory Visit: Payer: Self-pay | Admitting: Cardiology

## 2018-04-25 ENCOUNTER — Ambulatory Visit (INDEPENDENT_AMBULATORY_CARE_PROVIDER_SITE_OTHER): Payer: Self-pay | Admitting: Cardiology

## 2018-04-25 ENCOUNTER — Encounter: Payer: Self-pay | Admitting: Cardiology

## 2018-04-25 ENCOUNTER — Other Ambulatory Visit: Payer: Self-pay

## 2018-04-25 VITALS — BP 130/88 | HR 100 | Ht 72.5 in | Wt 154.4 lb

## 2018-04-25 DIAGNOSIS — I10 Essential (primary) hypertension: Secondary | ICD-10-CM

## 2018-04-25 DIAGNOSIS — Z720 Tobacco use: Secondary | ICD-10-CM

## 2018-04-25 DIAGNOSIS — Z79899 Other long term (current) drug therapy: Secondary | ICD-10-CM

## 2018-04-25 LAB — LIPID PANEL
CHOL/HDL RATIO: 2.8 ratio (ref 0.0–5.0)
Cholesterol, Total: 176 mg/dL (ref 100–199)
HDL: 63 mg/dL (ref >39)
LDL Calculated: 90 mg/dL (ref 0–99)
Triglycerides: 117 mg/dL (ref 0–149)
VLDL Cholesterol Cal: 23 mg/dL (ref 5–40)

## 2018-04-25 MED ORDER — AMLODIPINE BESYLATE 2.5 MG PO TABS: 2.5000 mg | ORAL_TABLET | Freq: Every day | ORAL | 3 refills | Status: DC

## 2018-04-25 NOTE — Progress Notes (Signed)
Cardiology Office Note:    Date:  04/25/2018   ID:  Anthony Villanueva, DOB [birth year], MRN [id number]  PCP:  Patient, No Pcp Per  Cardiologist:  Buford Dresser, MD PhD  Referring MD: No ref. provider found   CC. Follow up  History of Present Illness:    Anthony Villanueva is a 63 y.o. male with a hx of prostate cancer who is seen in follow up today. I saw him initially as a new patient 01/26/18 for preoperative dental surgery cardiac evaluation.  Cardiac history: no history, had not been to the doctor until recently. No known ASCVD. Tobacco, alcohol, and MJ abuse as risk factors. Found to have hypertension at initial visit, recommended to see HTN clinic and establish with PCP.  FH: mother died of cancer, one brother in poor health due to cocaine. No known heart disease or stroke.   Today: has been out of his medications for about two months (filled for one month, then couldn't afford). Not currently working, can't afford meds. Cut back to 2-3 cigarettes/day, one 40 oz beer/day.   Denies chest pain, shortness of breath at rest or with normal exertion. No PND, orthopnea, LE edema or unexpected weight gain. No syncope or palpitations.  Had a PCP but had to drop back due to his prior work schedule. Doesn't have insurance, working on [employer] with [employer].   We discussed that his BP today is much improved from his prior (when his systolic BP was >248). Denied any symptoms. States that he felt better on medication and would like to restart to control his BP.  Past Medical History:  Diagnosis Date  . Cancer [facility])    prostate  . Childhood asthma    "no longer gives me any problems" 02/10/18  . Hypertension     Past Surgical History:  Procedure Laterality Date  . MULTIPLE EXTRACTIONS WITH ALVEOLOPLASTY N/A 02/22/2018   Procedure: MULTIPLE EXTRACTION WITH ALVEOLOPLASTY;  Surgeon: Lenn Cal, DDS;  Location: [place];  Service: Oral Surgery;  Laterality: N/A;  NASAL TUBE  . PROSTATE BIOPSY   05/11/2017   CT core bx L retroperitoneal LAN Archie Endo 05/11/2017    Current Medications: Current Outpatient Medications on File Prior to Visit  Medication Sig  . calcium-vitamin D (OSCAL WITH D) 500-200 MG-UNIT tablet Take 1 tablet by mouth 2 (two) times daily.  . enzalutamide (XTANDI) 40 MG capsule Take 4 capsules (160 mg total) by mouth daily.   No current facility-administered medications on file prior to visit.      Allergies:   Patient has no known allergies.   Social History   Socioeconomic History  . Marital status: Significant Other    Spouse name: Not on file  . Number of children: 5  . Years of education: Not on file  . Highest education level: Not on file  Occupational History  . Not on file  Social Needs  . Financial resource strain: Not on file  . Food insecurity:    Worry: Not on file    Inability: Not on file  . Transportation needs:    Medical: Not on file    Non-medical: Not on file  Tobacco Use  . Smoking status: Former Smoker    Packs/day: 0.25    Years: 43.00    Pack years: 10.75    Types: Cigarettes    Last attempt to quit: 02/08/2018    Years since quitting: 0.2  . Smokeless tobacco: Never Used  Substance and Sexual Activity  . Alcohol  use: Yes    Comment: 02/10/2018 "2 - 40oz beer/day"  . Drug use: Yes    Frequency: 7.0 times per week    Types: Marijuana    Comment: daily use  . Sexual activity: Not Currently  Lifestyle  . Physical activity:    Days per week: Not on file    Minutes per session: Not on file  . Stress: Not on file  Relationships  . Social connections:    Talks on phone: Not on file    Gets together: Not on file    Attends religious service: Not on file    Active member of club or organization: Not on file    Attends meetings of clubs or organizations: Not on file    Relationship status: Not on file  Other Topics Concern  . Not on file  Social History Narrative  . Not on file     Family History: mother died of  cancer, one brother in poor health due to cocaine. No known heart disease or stroke.   ROS:   Please see the history of present illness.  Additional pertinent ROS:  Constitutional: Negative for chills, fever, night sweats (used to have at initial diagnosis, now improve). Positive for unintentional weight loss, poor appetite HENT: Negative for ear pain and hearing loss.   Eyes: Negative for loss of vision and eye pain.  Respiratory: Negative for cough, sputum, shortness of breath, wheezing.   Cardiovascular: As per HPI. Gastrointestinal: Negative for abdominal pain, melena, and hematochezia. Still has diarrhea. (lost 75 lbs) Genitourinary: Negative for dysuria and hematuria. (initial hematuria resolved). Musculoskeletal: Negative for falls and myalgias.  Skin: Negative for itching and rash.  Neurological: Negative for focal weakness, focal sensory changes and loss of consciousness.  Endo/Heme/Allergies: Does not bruise/bleed easily.    EKGs/Labs/Other Studies Reviewed:    The following studies were reviewed today: No prior cardiac testing.   EKG:  EKG is personally reviewed.  The ekg ordered 01/16/18 demonstrates sinus tachycardia.   Recent Labs: 06/03/2017: Magnesium 2.0 02/22/2018: ALT 15; BUN 11; Creatinine, Ser 0.65; Hemoglobin 12.7; Platelets 276; Potassium 4.0; Sodium 137  Recent Lipid Panel No results found for: CHOL, TRIG, HDL, CHOLHDL, VLDL, LDLCALC, LDLDIRECT  Physical Exam:    VS:  BP 130/88   Pulse 100   Ht 6' 0.5" (1.842 m)   Wt 154 lb 6.4 oz (70 kg)   BMI 20.65 kg/m     Wt Readings from Last 3 Encounters:  04/25/18 154 lb 6.4 oz (70 kg)  03/07/18 156 lb 8.4 oz (71 kg)  02/17/18 156 lb 8.4 oz (71 kg)     GEN: Thin gentleman in no acute distress HEENT: Normal NECK: No JVD; No carotid bruits LYMPHATICS: No lymphadenopathy CARDIAC: regular rhythm, normal S1 and S2, no murmurs, rubs, gallops. Radial and DP pulses 2+ bilaterally. RESPIRATORY:  Clear to  auscultation without rales, wheezing or rhonchi  ABDOMEN: Soft, non-tender, non-distended MUSCULOSKELETAL:  No edema; No deformity  SKIN: Warm and dry NEUROLOGIC:  Alert and oriented x 3 PSYCHIATRIC:  Normal affect   ASSESSMENT:    1. Essential hypertension   2. Medication management   3. Tobacco abuse    PLAN:    Hypertension: much improved from prior (when systolic was >086). Has been off of meds for two months. Discussed options, including not restarting meds and monitoring. He felt better on meds and wants to avoid being too high. Given his history of multiple elevated BP in the past, will  start back on lower dose of medication.  -will start amlodipine 2.5 mg today (was on 10 mg for a month in the past). Counseled on leg edema  -discussed chlorthalidone, but he is concerned as he already urinates frequently, and each time he urinates he also has to have a bowel movement. He would prefer to avoid any medications that may increase urinary frequency  -again recommended establishing with PCP for routine follow up. If he gets a PCP, he does not need to keep his appointment with me in 6 mos unless he has additional needs.  Tobacco abuse counseling: The patient was counseled on tobacco cessation today for 5 minutes.  Counseling included reviewing the risks of smoking tobacco products, how it impacts the patient's current medical diagnoses and different strategies for quitting.  Pharmacotherapy to aid in tobacco cessation was not prescribed today.  Primary prevention -recommend heart healthy/Mediterranean diet, with whole grains, fruits, vegetable, fish, lean meats, nuts, and olive oil. Limit salt. -recommend moderate walking, 3-5 times/week for 30-50 minutes each session. Aim for at least 150 minutes.week. Goal should be pace of 3 miles/hours, or walking 1.5 miles in 30 minutes -recommend avoidance of tobacco products. Avoid excess alcohol. -checking lipids today  Plan for follow up: 6 mos   TIME SPENT WITH PATIENT: 15 minutes of direct patient care. More than 50% of that time was spent on coordination of care and counseling regarding prevention, tobacco and alcohol recommendations, blood pressure goals.  Buford Dresser, MD, PhD   [facility]   Medication Adjustments/Labs and Tests Ordered: Current medicines are reviewed at length with the patient today.  Concerns regarding medicines are outlined above.  Orders Placed This Encounter  Procedures  . Lipid panel   Meds ordered this encounter  Medications  . amLODipine (NORVASC) 2.5 MG tablet    Sig: Take 1 tablet (2.5 mg total) by mouth daily.    Dispense:  180 tablet    Refill:  3    Patient Instructions  Medication Instructions:  Start: Amlodipine 2.5 mg daily  If you need a refill on your cardiac medications before your next appointment, please call your pharmacy.   Lab work: Your physician recommends that you return for lab work today ( Lipids)   Testing/Procedures: None  Follow-Up: At [facility], you and your health needs are our priority.  As part of our continuing mission to provide you with exceptional heart care, we have created designated Provider Care Teams.  These Care Teams include your primary Cardiologist (physician) and Advanced Practice Providers (APPs -  Physician Assistants and Nurse Practitioners) who all work together to provide you with the care you need, when you need it. You will need a follow up appointment in 6 months.  Please call our office 2 months in advance to schedule this appointment.  You may see Buford Dresser, MD or one of the following Advanced Practice Providers on your designated Care Team:   Rosaria Ferries, PA-C . Jory Sims, DNP, ANP        Signed, Buford Dresser, MD PhD 04/25/2018 10:28 AM    [place]

## 2018-04-25 NOTE — Patient Instructions (Signed)
Medication Instructions:  Start: Amlodipine 2.5 mg daily  If you need a refill on your cardiac medications before your next appointment, please call your pharmacy.   Lab work: Your physician recommends that you return for lab work today ( Lipids)   Testing/Procedures: None  Follow-Up: At [facility], you and your health needs are our priority.  As part of our continuing mission to provide you with exceptional heart care, we have created designated Provider Care Teams.  These Care Teams include your primary Cardiologist (physician) and Advanced Practice Providers (APPs -  Physician Assistants and Nurse Practitioners) who all work together to provide you with the care you need, when you need it. You will need a follow up appointment in 6 months.  Please call our office 2 months in advance to schedule this appointment.  You may see Buford Dresser, MD or one of the following Advanced Practice Providers on your designated Care Team:   Rosaria Ferries, PA-C . Jory Sims, DNP, ANP

## 2018-05-02 ENCOUNTER — Other Ambulatory Visit: Payer: Self-pay | Admitting: *Deleted

## 2018-05-02 DIAGNOSIS — C61 Malignant neoplasm of prostate: Secondary | ICD-10-CM

## 2018-05-02 MED ORDER — ENZALUTAMIDE 40 MG PO CAPS: 160.0000 mg | ORAL_CAPSULE | Freq: Every day | ORAL | 0 refills | Status: DC

## 2018-05-19 ENCOUNTER — Encounter: Payer: Self-pay | Admitting: General Practice

## 2018-05-19 NOTE — Progress Notes (Signed)
[facility] Support Services   [facility] Support Services Team contacted patient to assess for food insecurity and other psychosocial needs during current COVID19 pandemic.    Patient/family expressed no needs at this time.  Support Team member encouraged patient to call if changes occur or they have any other questions/concerns.    Anne C Cunningham, LCSW  [facility]        

## 2018-05-22 ENCOUNTER — Telehealth: Payer: Self-pay | Admitting: *Deleted

## 2018-05-24 ENCOUNTER — Other Ambulatory Visit: Payer: Self-pay

## 2018-05-24 DIAGNOSIS — C61 Malignant neoplasm of prostate: Secondary | ICD-10-CM

## 2018-05-24 MED ORDER — ENZALUTAMIDE 40 MG PO CAPS: 160.0000 mg | ORAL_CAPSULE | Freq: Every day | ORAL | 0 refills | Status: DC

## 2018-06-06 ENCOUNTER — Other Ambulatory Visit: Payer: Self-pay

## 2018-06-06 ENCOUNTER — Inpatient Hospital Stay (HOSPITAL_BASED_OUTPATIENT_CLINIC_OR_DEPARTMENT_OTHER): Payer: Medicaid Other | Admitting: Oncology

## 2018-06-06 ENCOUNTER — Telehealth: Payer: Self-pay | Admitting: Oncology

## 2018-06-06 ENCOUNTER — Inpatient Hospital Stay: Payer: Medicaid Other

## 2018-06-06 ENCOUNTER — Inpatient Hospital Stay: Payer: Medicaid Other | Attending: Oncology

## 2018-06-06 VITALS — BP 134/83 | HR 99 | Temp 99.2°F | Resp 18 | Ht 72.5 in | Wt 153.4 lb

## 2018-06-06 DIAGNOSIS — C61 Malignant neoplasm of prostate: Secondary | ICD-10-CM

## 2018-06-06 DIAGNOSIS — C7951 Secondary malignant neoplasm of bone: Secondary | ICD-10-CM

## 2018-06-06 DIAGNOSIS — Z5111 Encounter for antineoplastic chemotherapy: Secondary | ICD-10-CM | POA: Insufficient documentation

## 2018-06-06 LAB — CMP (CANCER CENTER ONLY)
ALT: <6 U/L (ref 0–44)
AST: 10 U/L — ABNORMAL LOW (ref 15–41)
Albumin: 3.3 g/dL — ABNORMAL LOW (ref 3.5–5.0)
Alkaline Phosphatase: 81 U/L (ref 38–126)
Anion gap: 9 (ref 5–15)
BUN: 5 mg/dL — ABNORMAL LOW (ref 8–23)
CO2: 26 mmol/L (ref 22–32)
Calcium: 9.1 mg/dL (ref 8.9–10.3)
Chloride: 103 mmol/L (ref 98–111)
Creatinine: 0.77 mg/dL (ref 0.61–1.24)
GFR, Est AFR Am: >60 mL/min (ref >60)
GFR, Estimated: >60 mL/min (ref >60)
Glucose, Bld: 80 mg/dL (ref 70–99)
Potassium: 3.9 mmol/L (ref 3.5–5.1)
Sodium: 138 mmol/L (ref 135–145)
Total Bilirubin: 0.4 mg/dL (ref 0.3–1.2)
Total Protein: 7.1 g/dL (ref 6.5–8.1)

## 2018-06-06 LAB — CBC WITH DIFFERENTIAL (CANCER CENTER ONLY)
Abs Immature Granulocytes: 0.02 10*3/uL (ref 0.00–0.07)
Basophils Absolute: 0 10*3/uL (ref 0.0–0.1)
Basophils Relative: 0 %
Eosinophils Absolute: 0 10*3/uL (ref 0.0–0.5)
Eosinophils Relative: 1 %
HCT: 38.9 % — ABNORMAL LOW (ref 39.0–52.0)
Hemoglobin: 13.1 g/dL (ref 13.0–17.0)
Immature Granulocytes: 0 %
Lymphocytes Relative: 20 %
Lymphs Abs: 1 10*3/uL (ref 0.7–4.0)
MCH: 32.5 pg (ref 26.0–34.0)
MCHC: 33.7 g/dL (ref 30.0–36.0)
MCV: 96.5 fL (ref 80.0–100.0)
Monocytes Absolute: 0.5 10*3/uL (ref 0.1–1.0)
Monocytes Relative: 11 %
Neutro Abs: 3.4 10*3/uL (ref 1.7–7.7)
Neutrophils Relative %: 68 %
Platelet Count: 331 10*3/uL (ref 150–400)
RBC: 4.03 MIL/uL — ABNORMAL LOW (ref 4.22–5.81)
RDW: 11.9 % (ref 11.5–15.5)
WBC Count: 5 10*3/uL (ref 4.0–10.5)
nRBC: 0 % (ref 0.0–0.2)

## 2018-06-06 MED ORDER — LEUPROLIDE ACETATE (4 MONTH) 30 MG IM KIT
30.0000 mg | PACK | Freq: Once | INTRAMUSCULAR | Status: AC
  Administered 2018-06-06: 30 mg via INTRAMUSCULAR
  Filled 2018-06-06: qty 30

## 2018-06-06 NOTE — Telephone Encounter (Signed)
Scheduled 30mth f/u per sch msg. Mailed printout

## 2018-06-06 NOTE — Patient Instructions (Signed)

## 2018-06-06 NOTE — Telephone Encounter (Signed)
Per 4/28 sch message, appt scheduled.

## 2018-06-06 NOTE — Progress Notes (Signed)
Hematology and Oncology Follow Up Visit  Anthony Villanueva [id number] [birth year] 63 y.o. 06/06/2018 11:43 AM Patient, No Pcp PerNo ref. provider found   Principle Diagnosis: 63 year old man with advanced prostate cancer with disease to the bone diagnosed in April 2019.  He has castration-sensitive disease with PSA of 293 and pelvic adenopathy at the time of diagnosis.   Prior Therapy: He is status post CT-guided biopsy of retroperitoneal adenopathy on April 3 of 2019  Current therapy:  Lupron 30 mg every 4 months with the first dose given on May 31, 2017.  Xtandi 160 mg daily started in July 2019.  Interim History: Anthony Villanueva returns today for a repeat evaluation.  Since the last visit, he reports no major changes in his health.  He continues to tolerate Xtandi without any recent decline in his performance status or quality of life.  He denies any bone pain or pathological fractures.  He denies any worsening blood pressure or confusion.  He denies any hematuria or dizziness.  No seizure activity.   He denied any alteration mental status, neuropathy, confusion or dizziness.  Denies any headaches or lethargy.  Denies any night sweats, weight loss or changes in appetite.  Denied orthopnea, dyspnea on exertion or chest discomfort.  Denies shortness of breath, difficulty breathing hemoptysis or cough.  Denies any abdominal distention, nausea, early satiety or dyspepsia.  Denies any hematuria, frequency, dysuria or nocturia.  Denies any skin irritation, dryness or rash.  Denies any ecchymosis or petechiae.  Denies any lymphadenopathy or clotting.  Denies any heat or cold intolerance.  Denies any anxiety or depression.  Remaining review of system is negative.      Medications: I have reviewed the patient's current medications.  Current Outpatient Medications  Medication Sig Dispense Refill  . amLODipine (NORVASC) 2.5 MG tablet Take 1 tablet (2.5 mg total) by mouth daily. 180 tablet 3  .  calcium-vitamin D (OSCAL WITH D) 500-200 MG-UNIT tablet Take 1 tablet by mouth 2 (two) times daily. 90 tablet 3  . enzalutamide (XTANDI) 40 MG capsule Take 4 capsules (160 mg total) by mouth daily. 120 capsule 0   No current facility-administered medications for this visit.      Allergies: No Known Allergies  Past Medical History, Surgical history, Social history, and Family History were reviewed and updated.    Physical Exam:  Blood pressure 134/83, pulse 99, temperature 99.2 F (37.3 C), temperature source Oral, resp. rate 18, height 6' 0.5" (1.842 m), weight 153 lb 6.4 oz (69.6 kg), SpO2 100 %.     ECOG: 0    General appearance: Comfortable appearing without any discomfort Head: Normocephalic without any trauma Oropharynx: Mucous membranes are moist and pink without any thrush or ulcers. Eyes: Pupils are equal and round reactive to light. Lymph nodes: No cervical, supraclavicular, inguinal or axillary lymphadenopathy.   Heart:regular rate and rhythm.  S1 and S2 without leg edema. Lung: Clear without any rhonchi or wheezes.  No dullness to percussion. Abdomin: Soft, nontender, nondistended with good bowel sounds.  No hepatosplenomegaly. Musculoskeletal: No joint deformity or effusion.  Full range of motion noted. Neurological: No deficits noted on motor, sensory and deep tendon reflex exam. Skin: No petechial rash or dryness.  Appeared moist.         Lab Results: Lab Results  Component Value Date   WBC 4.7 02/22/2018   HGB 12.7 (L) 02/22/2018   HCT 38.6 (L) 02/22/2018   MCV 98.5 02/22/2018   PLT 276 02/22/2018  Chemistry      Component Value Date/Time   NA 137 02/22/2018 0752   NA 137 06/17/2017 1223   K 4.0 02/22/2018 0752   CL 103 02/22/2018 0752   CO2 23 02/22/2018 0752   BUN 11 02/22/2018 0752   BUN 8 06/17/2017 1223   CREATININE 0.65 02/22/2018 0752   CREATININE 0.80 02/02/2018 0800      Component Value Date/Time   CALCIUM 8.8 (L) 02/22/2018  0752   ALKPHOS 66 02/22/2018 0752   AST 19 02/22/2018 0752   AST 20 02/02/2018 0800   ALT 15 02/22/2018 0752   ALT 20 02/02/2018 0800   BILITOT 0.9 02/22/2018 0752   BILITOT 0.7 02/02/2018 0800      Results for MOSE, COLAIZZI (MRN [id number]) as of 06/06/2018 11:45  Ref. Range 12/02/2017 08:47 02/02/2018 08:00  Prostate Specific Ag, Serum Latest Ref Range: 0.0 - 4.0 ng/mL 1.5 2.0     Impression and Plan:  63 year old man with:  1.   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  He has been on Xtandi in addition to androgen deprivation without any major complications.  He had an excellent PSA response with nadir around 1.5.  His PSA did go up slightly in December 2019 although he is asymptomatic.  Risks and benefits of continuing this treatment at this time was discussed. He is agreeable to continue.  Sedative therapy if he developed castration resistant disease will be systemic chemotherapy.  At that time we will restage him with a CT scan and bone scan.   2.   Androgen deprivation: He remains on Lupron without any major concerns at this time.  Long-term complications were reiterated including osteoporosis and weight gain.  He is due for Lupron on June 06, 2018 to be repeated in 4 months.   3.  Bone directed therapy: I recommended proceeding with calcium and vitamin D supplements.  Delton See was deferred due to dental issues.  Continues to have teeth extraction although getting close to being completed.  4.  Prognosis: Therapy remains palliative although aggressive therapy is warranted given his excellent performance status.  5.  Anorexia: Appetite remained reasonable and his weight is stable.  6.  Follow-up: We will be in 4 months for repeat evaluation.  25  minutes was spent with the patient face-to-face today.  More than 50% of time was spent on reviewing his disease status, treatment options and answering  questions regarding future plan of care.Anthony Button, MD 4/28/202011:43 AM

## 2018-06-07 LAB — PROSTATE-SPECIFIC AG, SERUM (LABCORP): Prostate Specific Ag, Serum: 5.2 ng/mL — ABNORMAL HIGH (ref 0.0–4.0)

## 2018-06-13 ENCOUNTER — Other Ambulatory Visit: Payer: Self-pay

## 2018-06-13 DIAGNOSIS — C61 Malignant neoplasm of prostate: Secondary | ICD-10-CM

## 2018-06-13 MED ORDER — ENZALUTAMIDE 40 MG PO CAPS: 160.0000 mg | ORAL_CAPSULE | Freq: Every day | ORAL | 0 refills | Status: DC

## 2018-06-16 ENCOUNTER — Ambulatory Visit: Payer: Self-pay | Admitting: Family Medicine

## 2018-06-26 ENCOUNTER — Other Ambulatory Visit: Payer: Self-pay

## 2018-06-26 ENCOUNTER — Ambulatory Visit (INDEPENDENT_AMBULATORY_CARE_PROVIDER_SITE_OTHER): Payer: Self-pay | Admitting: Family Medicine

## 2018-06-26 ENCOUNTER — Encounter: Payer: Self-pay | Admitting: Family Medicine

## 2018-06-26 VITALS — BP 140/76 | HR 90 | Temp 98.2°F | Ht 72.0 in | Wt 155.8 lb

## 2018-06-26 DIAGNOSIS — R195 Other fecal abnormalities: Secondary | ICD-10-CM | POA: Insufficient documentation

## 2018-06-26 DIAGNOSIS — M25551 Pain in right hip: Secondary | ICD-10-CM | POA: Insufficient documentation

## 2018-06-26 DIAGNOSIS — Z7689 Persons encountering health services in other specified circumstances: Secondary | ICD-10-CM

## 2018-06-26 DIAGNOSIS — M25552 Pain in left hip: Secondary | ICD-10-CM

## 2018-06-26 DIAGNOSIS — C61 Malignant neoplasm of prostate: Secondary | ICD-10-CM

## 2018-06-26 DIAGNOSIS — Z0000 Encounter for general adult medical examination without abnormal findings: Secondary | ICD-10-CM

## 2018-06-26 DIAGNOSIS — R8290 Unspecified abnormal findings in urine: Secondary | ICD-10-CM

## 2018-06-26 DIAGNOSIS — Z09 Encounter for follow-up examination after completed treatment for conditions other than malignant neoplasm: Secondary | ICD-10-CM

## 2018-06-26 LAB — POCT URINALYSIS DIP (MANUAL ENTRY)
Bilirubin, UA: NEGATIVE
Glucose, UA: NEGATIVE mg/dL
Ketones, POC UA: NEGATIVE mg/dL
Leukocytes, UA: NEGATIVE
Nitrite, UA: NEGATIVE
Protein Ur, POC: 30 mg/dL — AB
Spec Grav, UA: 1.025 (ref 1.010–1.025)
Urobilinogen, UA: 0.2 E.U./dL
pH, UA: 5 (ref 5.0–8.0)

## 2018-06-26 MED ORDER — METHOCARBAMOL 500 MG PO TABS: 500.0000 mg | ORAL_TABLET | Freq: Two times a day (BID) | ORAL | 2 refills | Status: DC

## 2018-06-26 MED ORDER — LOPERAMIDE HCL 2 MG PO TABS: 2.0000 mg | ORAL_TABLET | Freq: Four times a day (QID) | ORAL | 3 refills | Status: DC | PRN

## 2018-06-26 MED ORDER — TRAMADOL HCL 50 MG PO TABS: 50.0000 mg | ORAL_TABLET | Freq: Three times a day (TID) | ORAL | 0 refills | Status: AC | PRN

## 2018-06-26 NOTE — Progress Notes (Signed)
Patient [facility]   Re-Establishing Care  Subjective:  Patient ID: Anthony Villanueva, male    DOB: Dec 15, 1955  Age: 62 y.o. MRN: [id number]  CC:  Chief Complaint  Patient presents with  . Establish Care  . Edema    Ankle  . Hip Pain    bilateral    HPI Anthony Villanueva is a 63 year old male who presents to Re-establish Care at our office today.   Past Medical History:  Diagnosis Date  . Cancer [facility])    prostate  . Childhood asthma    "no longer gives me any problems" 02/10/18  . Hypertension   . Loose stools   . Prostate cancer [facility])    Current Status: Since his last office visit, he reports that he has began to have bilateral hip pain. She denies visual changes, chest pain, cough, shortness of breath, heart palpitations, and falls. She has occasional headaches and dizziness with position changes. Denies severe headaches, confusion, seizures, double vision, and blurred vision, nausea and vomiting. He is followed by Dr. Alen Blew at [facility] for his recent diagnosis of Advanced Prostate Cancer on 05/2017. He continues to follow up with him every 4 months for injections and monitoring of Xtandi. He is currently awaiting for financial assistance for dental surgery. He has c/o chronic bilateral lower back pain.   He denies fevers, chills, fatigue, recent infections, weight loss, and night sweats. No reports of GI problems such as diarrhea, and constipation. He has no reports of blood in stools, dysuria and hematuria. No depression or anxiety reported today He denies pain today.   Past Surgical History:  Procedure Laterality Date  . MULTIPLE EXTRACTIONS WITH ALVEOLOPLASTY N/A 02/22/2018   Procedure: MULTIPLE EXTRACTION WITH ALVEOLOPLASTY;  Surgeon: Lenn Cal, DDS;  Location: [place];  Service: Oral Surgery;  Laterality: N/A;  NASAL TUBE  . PROSTATE BIOPSY  05/11/2017   CT core bx L retroperitoneal LAN Archie Endo 05/11/2017    Family History  Problem  Relation Age of Onset  . Cancer Mother     Social History   Socioeconomic History  . Marital status: Significant Other    Spouse name: Not on file  . Number of children: 5  . Years of education: Not on file  . Highest education level: Not on file  Occupational History  . Not on file  Social Needs  . Financial resource strain: Not on file  . Food insecurity:    Worry: Not on file    Inability: Not on file  . Transportation needs:    Medical: Not on file    Non-medical: Not on file  Tobacco Use  . Smoking status: Current Some Day Smoker    Packs/day: 0.25    Years: 43.00    Pack years: 10.75    Types: Cigarettes    Last attempt to quit: 02/08/2018    Years since quitting: 0.3  . Smokeless tobacco: Never Used  Substance and Sexual Activity  . Alcohol use: Yes    Comment: 02/10/2018 "2 - 40oz beer/day"  . Drug use: Yes    Frequency: 7.0 times per week    Types: Marijuana    Comment: daily use  . Sexual activity: Not Currently  Lifestyle  . Physical activity:    Days per week: Not on file    Minutes per session: Not on file  . Stress: Not on file  Relationships  . Social connections:    Talks on  phone: Not on file    Gets together: Not on file    Attends religious service: Not on file    Active member of club or organization: Not on file    Attends meetings of clubs or organizations: Not on file    Relationship status: Not on file  . Intimate partner violence:    Fear of current or ex partner: Not on file    Emotionally abused: Not on file    Physically abused: Not on file    Forced sexual activity: Not on file  Other Topics Concern  . Not on file  Social History Narrative  . Not on file    Outpatient Medications Prior to Visit  Medication Sig Dispense Refill  . amLODipine (NORVASC) 2.5 MG tablet Take 1 tablet (2.5 mg total) by mouth daily. 180 tablet 3  . enzalutamide (XTANDI) 40 MG capsule Take 4 capsules (160 mg total) by mouth daily. 120 capsule 0  .  calcium-vitamin D (OSCAL WITH D) 500-200 MG-UNIT tablet Take 1 tablet by mouth 2 (two) times daily. (Patient not taking: Reported on 06/26/2018) 90 tablet 3   No facility-administered medications prior to visit.     No Known Allergies  ROS Review of Systems  Constitutional: Negative.   HENT: Positive for dental problem (all teeth are removed).   Eyes: Negative.   Respiratory: Negative.   Cardiovascular: Negative.   Gastrointestinal: Negative.   Endocrine: Negative.   Genitourinary: Negative.   Musculoskeletal: Positive for back pain (chronic back pain).  Skin: Negative.   Allergic/Immunologic: Negative.   Neurological: Positive for dizziness and headaches.  Hematological: Negative.   Psychiatric/Behavioral: Negative.    Objective:    Physical Exam  Constitutional: He is oriented to person, place, and time. He appears well-developed and well-nourished.  HENT:  Head: Normocephalic and atraumatic.  Eyes: Conjunctivae are normal.  Neck: Normal range of motion. Neck supple.  Cardiovascular: Normal rate, regular rhythm, normal heart sounds and intact distal pulses.  Pulmonary/Chest: Effort normal and breath sounds normal.  Abdominal: Soft. Bowel sounds are normal.  Musculoskeletal: Normal range of motion.  Neurological: He is alert and oriented to person, place, and time. He has normal reflexes.  Skin: Skin is warm and dry.  Psychiatric: He has a normal mood and affect. His behavior is normal. Judgment and thought content normal.  Vitals reviewed.   BP 140/76 (BP Location: Left Arm, Patient Position: Sitting, Cuff Size: Small)   Pulse 90   Temp 98.2 F (36.8 C) (Oral)   Ht 6' (1.829 m)   Wt 155 lb 12.8 oz (70.7 kg)   SpO2 100%   BMI 21.13 kg/m  Wt Readings from Last 3 Encounters:  06/26/18 155 lb 12.8 oz (70.7 kg)  06/06/18 153 lb 6.4 oz (69.6 kg)  04/25/18 154 lb 6.4 oz (70 kg)     Health Maintenance Due  Topic Date Due  . Hepatitis C Screening  1955/12/03  .  COLONOSCOPY  05/25/2007    There are no preventive care reminders to display for this patient.  No results found for: TSH Lab Results  Component Value Date   WBC 5.0 06/06/2018   HGB 13.1 06/06/2018   HCT 38.9 (L) 06/06/2018   MCV 96.5 06/06/2018   PLT 331 06/06/2018   Lab Results  Component Value Date   NA 138 06/06/2018   K 3.9 06/06/2018   CO2 26 06/06/2018   GLUCOSE 80 06/06/2018   BUN 5 (L) 06/06/2018   CREATININE 0.77  06/06/2018   BILITOT 0.4 06/06/2018   ALKPHOS 81 06/06/2018   AST 10 (L) 06/06/2018   ALT <6 06/06/2018   PROT 7.1 06/06/2018   ALBUMIN 3.3 (L) 06/06/2018   CALCIUM 9.1 06/06/2018   ANIONGAP 9 06/06/2018   Lab Results  Component Value Date   CHOL 176 04/25/2018   Lab Results  Component Value Date   HDL 63 04/25/2018   Lab Results  Component Value Date   LDLCALC 90 04/25/2018   Lab Results  Component Value Date   TRIG 117 04/25/2018   Lab Results  Component Value Date   CHOLHDL 2.8 04/25/2018   No results found for: HGBA1C    Assessment & Plan:   1. Encounter to establish care - POCT urinalysis dipstick  2. Bilateral hip pain We will initiate Tramadol today/  - traMADol (ULTRAM) 50 MG tablet; Take 1 tablet (50 mg total) by mouth every 8 (eight) hours as needed for up to 10 days.  Dispense: 30 tablet; Refill: 0  3. Pain of both hip joints  4. Prostate cancer [facility]) He was diagnosed with Advanced Prostate Cancer with bone mets on 05/2017. She continues to Lupron and Xtandi as prescribed.   5. Loose stools He will continue Imodium as needed.  - loperamide (IMODIUM A-D) 2 MG tablet; Take 1 tablet (2 mg total) by mouth 4 (four) times daily as needed for diarrhea or loose stools.  Dispense: 30 tablet; Refill: 3  6. Abnormal urinalysis Results are pending.  - Urine Culture  7. Healthcare maintenance - Vitamin B12 - Vitamin D, 25-hydroxy - TSH  8. Follow up He will follow up in 1 month.   Meds ordered this encounter   Medications  . traMADol (ULTRAM) 50 MG tablet    Sig: Take 1 tablet (50 mg total) by mouth every 8 (eight) hours as needed for up to 10 days.    Dispense:  30 tablet    Refill:  0  . methocarbamol (ROBAXIN) 500 MG tablet    Sig: Take 1 tablet (500 mg total) by mouth 2 (two) times a day.    Dispense:  60 tablet    Refill:  2  . loperamide (IMODIUM A-D) 2 MG tablet    Sig: Take 1 tablet (2 mg total) by mouth 4 (four) times daily as needed for diarrhea or loose stools.    Dispense:  30 tablet    Refill:  3    Orders Placed This Encounter  Procedures  . Urine Culture  . Vitamin B12  . Vitamin D, 25-hydroxy  . TSH  . POCT urinalysis dipstick    Referral Orders  No referral(s) requested today    Kathe Becton,  MSN, FNP-C Patient [place] [place], [place] [address] [phone]   Problem List Items Addressed This Visit      Genitourinary   Prostate cancer [facility])    Other Visit Diagnoses    Encounter to establish care    -  Primary   Relevant Orders   POCT urinalysis dipstick (Completed)   Bilateral hip pain       Relevant Medications   traMADol (ULTRAM) 50 MG tablet   Pain of both hip joints       Loose stools       Relevant Medications   loperamide (IMODIUM A-D) 2 MG tablet   Abnormal urinalysis       Relevant Orders   Urine Culture   Healthcare  maintenance       Relevant Orders   Vitamin B12   Vitamin D, 25-hydroxy   TSH   Follow up          Meds ordered this encounter  Medications  . traMADol (ULTRAM) 50 MG tablet    Sig: Take 1 tablet (50 mg total) by mouth every 8 (eight) hours as needed for up to 10 days.    Dispense:  30 tablet    Refill:  0  . methocarbamol (ROBAXIN) 500 MG tablet    Sig: Take 1 tablet (500 mg total) by mouth 2 (two) times a day.    Dispense:  60 tablet    Refill:  2  . loperamide (IMODIUM A-D) 2 MG tablet    Sig: Take 1 tablet (2 mg total) by mouth 4 (four) times daily as  needed for diarrhea or loose stools.    Dispense:  30 tablet    Refill:  3    Follow-up: Return in about 1 month (around 07/27/2018).    Azzie Glatter, FNP

## 2018-06-26 NOTE — Patient Instructions (Addendum)
Tramadol tablets What is this medicine? TRAMADOL (TRA ma dole) is a pain reliever. It is used to treat moderate to severe pain in adults. This medicine may be used for other purposes; ask your health care provider or pharmacist if you have questions. COMMON BRAND NAME(S): Ultram What should I tell my health care provider before I take this medicine? They need to know if you have any of these conditions: -brain tumor -depression -drug abuse or addiction -head injury -if you frequently drink alcohol containing drinks -kidney disease or trouble passing urine -liver disease -lung disease, asthma, or breathing problems -seizures or epilepsy -suicidal thoughts, plans, or attempt; a previous suicide attempt by you or a family member -an unusual or allergic reaction to tramadol, codeine, other medicines, foods, dyes, or preservatives -pregnant or trying to get pregnant -breast-feeding How should I use this medicine? Take this medicine by mouth with a full glass of water. Follow the directions on the prescription label. You can take it with or without food. If it upsets your stomach, take it with food. Do not take your medicine more often than directed. A special MedGuide will be given to you by the pharmacist with each prescription and refill. Be sure to read this information carefully each time. Talk to your pediatrician regarding the use of this medicine in children. Special care may be needed. Overdosage: If you think you have taken too much of this medicine contact a poison control center or emergency room at once. NOTE: This medicine is only for you. Do not share this medicine with others. What if I miss a dose? If you miss a dose, take it as soon as you can. If it is almost time for your next dose, take only that dose. Do not take double or extra doses. What may interact with this medicine? Do not take this medication with any of the following medicines: -MAOIs like Carbex, Eldepryl,  Marplan, Nardil, and Parnate This medicine may also interact with the following medications: -alcohol -antihistamines for allergy, cough and cold -certain medicines for anxiety or sleep -certain medicines for depression like amitriptyline, fluoxetine, sertraline -certain medicines for migraine headache like almotriptan, eletriptan, frovatriptan, naratriptan, rizatriptan, sumatriptan, zolmitriptan -certain medicines for seizures like carbamazepine, oxcarbazepine, phenobarbital, primidone -certain medicines that treat or prevent blood clots like warfarin -digoxin -furazolidone -general anesthetics like halothane, isoflurane, methoxyflurane, propofol -linezolid -local anesthetics like lidocaine, pramoxine, tetracaine -medicines that relax muscles for surgery -other narcotic medicines for pain or cough -phenothiazines like chlorpromazine, mesoridazine, prochlorperazine, thioridazine -procarbazine This list may not describe all possible interactions. Give your health care provider a list of all the medicines, herbs, non-prescription drugs, or dietary supplements you use. Also tell them if you smoke, drink alcohol, or use illegal drugs. Some items may interact with your medicine. What should I watch for while using this medicine? Tell your doctor or health care professional if your pain does not go away, if it gets worse, or if you have new or a different type of pain. You may develop tolerance to the medicine. Tolerance means that you will need a higher dose of the medicine for pain relief. Tolerance is normal and is expected if you take this medicine for a long time. Do not suddenly stop taking your medicine because you may develop a severe reaction. Your body becomes used to the medicine. This does NOT mean you are addicted. Addiction is a behavior related to getting and using a drug for a non-medical reason. If you have pain, you  have a medical reason to take pain medicine. Your doctor will tell  you how much medicine to take. If your doctor wants you to stop the medicine, the dose will be slowly lowered over time to avoid any side effects. There are different types of narcotic medicines (opiates). If you take more than one type at the same time or if you are taking another medicine that also causes drowsiness, you may have more side effects. Give your health care provider a list of all medicines you use. Your doctor will tell you how much medicine to take. Do not take more medicine than directed. Call emergency for help if you have problems breathing or unusual sleepiness. You may get drowsy or dizzy. Do not drive, use machinery, or do anything that needs mental alertness until you know how this medicine affects you. Do not stand or sit up quickly, especially if you are an older patient. This reduces the risk of dizzy or fainting spells. Alcohol can increase or decrease the effects of this medicine. Avoid alcoholic drinks. You may have constipation. Try to have a bowel movement at least every 2 to 3 days. If you do not have a bowel movement for 3 days, call your doctor or health care professional. Your mouth may get dry. Chewing sugarless gum or sucking hard candy, and drinking plenty of water may help. Contact your doctor if the problem does not go away or is severe. What side effects may I notice from receiving this medicine? Side effects that you should report to your doctor or health care professional as soon as possible: -allergic reactions like skin rash, itching or hives, swelling of the face, lips, or tongue -breathing problems -confusion -seizures -signs and symptoms of low blood pressure like dizziness; feeling faint or lightheaded, falls; unusually weak or tired -trouble passing urine or change in the amount of urine Side effects that usually do not require medical attention (report to your doctor or health care professional if they continue or are bothersome): -constipation -dry  mouth -nausea, vomiting -tiredness This list may not describe all possible side effects. Call your doctor for medical advice about side effects. You may report side effects to FDA at 1-800-FDA-1088. Where should I keep my medicine? Keep out of the reach of children. This medicine may cause accidental overdose and death if it taken by other adults, children, or pets. Mix any unused medicine with a substance like cat litter or coffee grounds. Then throw the medicine away in a sealed container like a sealed bag or a coffee can with a lid. Do not use the medicine after the expiration date. Store at room temperature between 15 and 30 degrees C (59 and 86 degrees F). NOTE: This sheet is a summary. It may not cover all possible information. If you have questions about this medicine, talk to your doctor, pharmacist, or health care provider.  2019 Elsevier/Gold Standard (2014-10-20 09:00:04) Methocarbamol tablets What is this medicine? METHOCARBAMOL (meth oh KAR ba mole) helps to relieve pain and stiffness in muscles caused by strains, sprains, or other injury to your muscles. This medicine may be used for other purposes; ask your health care provider or pharmacist if you have questions. COMMON BRAND NAME(S): Robaxin What should I tell my health care provider before I take this medicine? They need to know if you have any of these conditions: -kidney disease -seizures -an unusual or allergic reaction to methocarbamol, other medicines, foods, dyes, or preservatives -pregnant or trying to get pregnant -breast-feeding  How should I use this medicine? Take this medicine by mouth with a full glass of water. Follow the directions on the prescription label. Take your medicine at regular intervals. Do not take your medicine more often than directed. Talk to your pediatrician regarding the use of this medicine in children. Special care may be needed. Overdosage: If you think you have taken too much of this  medicine contact a poison control center or emergency room at once. NOTE: This medicine is only for you. Do not share this medicine with others. What if I miss a dose? If you miss a dose, take it as soon as you can. If it is almost time for your next dose, take only the next dose. Do not take double or extra doses. What may interact with this medicine? Do not take this medication with any of the following medicines: -narcotic medicines for cough This medicine may also interact with the following medications: -alcohol -antihistamines for allergy, cough and cold -certain medicines for anxiety or sleep -certain medicines for depression like amitriptyline, fluoxetine, sertraline -certain medicines for seizures like phenobarbital, primidone -cholinesterase inhibitors like neostigmine, ambenonium, and pyridostigmine bromide -general anesthetics like halothane, isoflurane, methoxyflurane, propofol -local anesthetics like lidocaine, pramoxine, tetracaine -medicines that relax muscles for surgery -narcotic medicines for pain -phenothiazines like chlorpromazine, mesoridazine, prochlorperazine, thioridazine This list may not describe all possible interactions. Give your health care provider a list of all the medicines, herbs, non-prescription drugs, or dietary supplements you use. Also tell them if you smoke, drink alcohol, or use illegal drugs. Some items may interact with your medicine. What should I watch for while using this medicine? Tell your doctor or health care professional if your symptoms do not start to get better or if they get worse. You may get drowsy or dizzy. Do not drive, use machinery, or do anything that needs mental alertness until you know how this medicine affects you. Do not stand or sit up quickly, especially if you are an older patient. This reduces the risk of dizzy or fainting spells. Alcohol may interfere with the effect of this medicine. Avoid alcoholic drinks. If you are  taking another medicine that also causes drowsiness, you may have more side effects. Give your health care provider a list of all medicines you use. Your doctor will tell you how much medicine to take. Do not take more medicine than directed. Call emergency for help if you have problems breathing or unusual sleepiness. What side effects may I notice from receiving this medicine? Side effects that you should report to your doctor or health care professional as soon as possible: -allergic reactions like skin rash, itching or hives, swelling of the face, lips, or tongue -breathing problems -confusion -seizures -unusually weak or tired Side effects that usually do not require medical attention (report to your doctor or health care professional if they continue or are bothersome): -dizziness -headache -metallic taste -tiredness -upset stomach This list may not describe all possible side effects. Call your doctor for medical advice about side effects. You may report side effects to FDA at 1-800-FDA-1088. Where should I keep my medicine? Keep out of the reach of children. Store at room temperature between 20 and 25 degrees C (68 and 77 degrees F). Keep container tightly closed. Throw away any unused medicine after the expiration date. NOTE: This sheet is a summary. It may not cover all possible information. If you have questions about this medicine, talk to your doctor, pharmacist, or health care provider.  2019 Elsevier/Gold Standard (2014-11-05 13:11:54) Hip Pain  The hip is the joint between the upper legs and the lower pelvis. The bones, cartilage, tendons, and muscles of your hip joint support your body and allow you to move around. Hip pain can range from a minor ache to severe pain in one or both of your hips. The pain may be felt on the inside of the hip joint near the groin, or the outside near the buttocks and upper thigh. You may also have swelling or stiffness. Follow these instructions  at home: Managing pain, stiffness, and swelling  If directed, apply ice to the injured area. ? Put ice in a plastic bag. ? Place a towel between your skin and the bag. ? Leave the ice on for 20 minutes, 2-3 times a day  Sleep with a pillow between your legs on your most comfortable side.  Avoid any activities that cause pain. General instructions  Take over-the-counter and prescription medicines only as told by your health care provider.  Do any exercises as told by your health care provider.  Record the following: ? How often you have hip pain. ? The location of your pain. ? What the pain feels like. ? What makes the pain worse.  Keep all follow-up visits as told by your health care provider. This is important. Contact a health care provider if:  You cannot put weight on your leg.  Your pain or swelling continues or gets worse after one week.  It gets harder to walk.  You have a fever. Get help right away if:  You fall.  You have a sudden increase in pain and swelling in your hip.  Your hip is red or swollen or very tender to touch. Summary  Hip pain can range from a minor ache to severe pain in one or both of your hips.  The pain may be felt on the inside of the hip joint near the groin, or the outside near the buttocks and upper thigh.  Avoid any activities that cause pain.  Record how often you have hip pain, the location of the pain, what makes it worse and what it feels like. This information is not intended to replace advice given to you by your health care provider. Make sure you discuss any questions you have with your health care provider. Document Released: 07/15/2009 Document Revised: 12/29/2015 Document Reviewed: 12/29/2015 Elsevier Interactive Patient Education  2019 Elsevier Inc.  Chronic Diarrhea Diarrhea is a condition in which a person passes frequent loose and watery stools. It can cause you to feel weak and dehydrated. Dehydration can make  you tired and thirsty. It can also cause a dry mouth, decreased urination, and dark yellow urine. Diarrhea is a sign of another underlying problem, such as:  Infection.  Medication side effects.  Dietary intolerance, such as lactose intolerance.  Conditions such as celiac disease, irritable bowel syndrome (IBS), or inflammatory bowel disease (IBD). In most cases, diarrhea lasts 2-3 days. Diarrhea that lasts longer than 4 weeks is called long-lasting (chronic) diarrhea. It is important that you treat your diarrhea as told by your health care provider. Follow these instructions at home: Follow these recommendations as told by your health care provider. Eating and drinking  Take an oral rehydration solution (ORS). This is a drink that is designed to keep you hydrated. It can be found at pharmacies and retail stores.  Drink clear fluids, such as water, ice chips, diluted fruit juice, and low-calorie sports drinks.  Follow the diet recommended by your health care provider. You may need to avoid foods that trigger diarrhea for you.  Avoid foods and beverages that contain a lot of sugar or caffeine.  Avoid alcohol.  Avoid spicy or fatty foods. General instructions      Drink enough fluid to keep your urine clear or pale yellow.  Wash your hands often and after each diarrhea episode. If soap and water are not available, use hand sanitizer.  Make sure that all people in your household wash their hands well and often.  Take over-the-counter and prescription medicines only as told by your health care provider.  If you were prescribed an antibiotic medicine, take it as told by your health care provider. Do not stop taking the antibiotic even if you start to feel better.  Rest at home while you recover.  Watch your condition for any changes.  Take a warm bath to relieve any burning or pain from frequent diarrhea episodes.  Keep all follow-up visits as told by your health care  provider. This is important. Contact a health care provider if:  You have a fever.  Your diarrhea gets worse or does not get better.  You have new symptoms.  You cannot drink fluids without vomiting.  You feel light-headed or dizzy.  You have a headache.  You have muscle cramps.  You have severe pain in the rectum. Get help right away if:  You have persistent vomiting.  You have chest pain.  You feel extremely weak or you faint.  You have bloody or black stools, or stools that look like tar.  You have severe pain, cramping, or bloating in your abdomen, or pain that stays in one place.  You have trouble breathing or you are breathing very quickly.  Your heart is beating very quickly.  Your skin feels cold and clammy.  You feel confused.  You have a severe headache.  You have signs of dehydration, such as: ? Dark urine, very little urine, or no urine. ? Cracked lips. ? Dry mouth. ? Sunken eyes. ? Sleepiness. ? Weakness. Summary  Chronic diarrhea is a condition in which a person passes frequent loose and watery stools for more than 4 weeks.  Diarrhea is a sign of another underlying problem.  Drink enough fluid to keep your urine clear or pale yellow to avoid dehydration.  Wash your hands often and after each diarrhea episode. If soap and water are not available, use hand sanitizer.  It is important that you treat your diarrhea as told by your health care provider. This information is not intended to replace advice given to you by your health care provider. Make sure you discuss any questions you have with your health care provider. Document Released: 04/17/2003 Document Revised: 12/15/2015 Document Reviewed: 12/15/2015 Elsevier Interactive Patient Education  2019 Elsevier Inc. Loperamide; Simethicone oral tablets What is this medicine? LOPERAMIDE; SIMETHICONE (loe PER a mide; sye METH i kone) is a combination product used to treat diarrhea and to decrease  the discomfort caused by gas. This medicine may be used for other purposes; ask your health care provider or pharmacist if you have questions. COMMON BRAND NAME(S): Imodium Advanced, Imodium Multi-Symptom Relief What should I tell my health care provider before I take this medicine? They need to know if you have any of these conditions: -a black or bloody stool -bacterial food poisoning -colitis or mucus in your stool -currently taking an antibiotic medication for an infection -fever -history of irregular  heartbeat -liver disease -severe abdominal pain, swelling or bulging -an unusual or allergic reaction to loperamide, simethicone, other medicines, foods, dyes, or preservatives -pregnant or trying to get pregnant -breast-feeding How should I use this medicine? Take this medicine by mouth with a glass of water. Follow the directions on the label. Do not take it more often than directed. Talk to your pediatrician regarding the use of this medicine in children. While this drug may be prescribed for children as young as 35 years of age for selected conditions, precautions do apply. Overdosage: If you think you have taken too much of this medicine contact a poison control center or emergency room at once. NOTE: This medicine is only for you. Do not share this medicine with others. What if I miss a dose? This does not apply. This medicine is not for regular use. Only take this medicine while you continue to have loose bowel movements. Do not take more medicine than recommended by the packaging label or by your healthcare professional. What may interact with this medicine? Do not take this medicine with any of the following medications: -alosetron This medicine may also interact with the following medications: -cimetidine -clarithromycin -erythromycin -gemfibrozil -itraconazole -ketoconazole -quinidinequinine -ranitidine -ritonavir -saquinavir This list may not describe all possible  interactions. Give your health care provider a list of all the medicines, herbs, non-prescription drugs, or dietary supplements you use. Also tell them if you smoke, drink alcohol, or use illegal drugs. Some items may interact with your medicine. What should I watch for while using this medicine? Tell your doctor or healthcare professional if your symptoms do not start to get better within two days or if they get worse. Do not use doses higher than those prescribed by your doctor or listed on the label. Check with your doctor or health care professional right away if you develop a fever, severe abdominal pain, swelling or bulging, or if you have have bloody/black diarrhea or stools. You may get drowsy or dizzy. Do not drive, use machinery, or do anything that needs mental alertness until you know how this medicine affects you. Do not stand or sit up quickly, especially if you are an older patient. This reduces the risk of dizzy or fainting spells. Alcohol may interfere with the effect of this medicine. Your mouth may get dry. Chewing sugarless gum or sucking hard candy, and drinking plenty of water may help. Contact your doctor if the problem does not go away or is severe. Drinking plenty of water can also help prevent dehydration that can occur with diarrhea. Elderly patients may have a more variable response to the effects of this medicine, and are more susceptible to the effects of dehydration. What side effects may I notice from receiving this medicine? Side effects that you should report to your doctor or health care professional as soon as possible: -allergic reactions like skin rash, itching or hives, swelling of the face, lips, or tongue -bloating -signs and symptoms of a dangerous change in heartbeat or heart rhythm like chest pain; dizziness; fast or irregular heartbeat palpitations; feeling faint or lightheaded, falls; breathing problems -stomach pain Side effects that usually do not require  medical attention (report to your doctor or health care professional if they continue or are bothersome): -constipation -dizziness -dry mouth -nausea, vomiting -tiredness This list may not describe all possible side effects. Call your doctor for medical advice about side effects. You may report side effects to FDA at 1-800-FDA-1088. Where should I keep my medicine?  Keep out of the reach of children. Store between 20 and 25 degrees C (68 and 77 degrees F). Throw away any unused medicine after the expiration date. NOTE: This sheet is a summary. It may not cover all possible information. If you have questions about this medicine, talk to your doctor, pharmacist, or health care provider.  2019 Elsevier/Gold Standard (2017-04-18 19:53:30)

## 2018-06-27 ENCOUNTER — Telehealth: Payer: Self-pay

## 2018-06-27 LAB — TSH: TSH: 1.57 u[IU]/mL (ref 0.450–4.500)

## 2018-06-27 LAB — VITAMIN D 25 HYDROXY (VIT D DEFICIENCY, FRACTURES): Vit D, 25-Hydroxy: 14.9 ng/mL — ABNORMAL LOW (ref 30.0–100.0)

## 2018-06-27 LAB — VITAMIN B12: Vitamin B-12: 368 pg/mL (ref 232–1245)

## 2018-06-27 NOTE — Telephone Encounter (Signed)
Oral Oncology Patient Advocate Encounter  The patients current Xtandi application will expire on 07/19/18.  I have filled out a new application and verified with the patient that nothing has changed.  I faxed the completed renewal application today, 7/48.  This encounter will be updated until final determination.  [facility] [facility] [place] Phone [phone] Fax [phone] 06/27/2018   2:43 PM

## 2018-06-28 LAB — URINE CULTURE

## 2018-07-04 ENCOUNTER — Other Ambulatory Visit: Payer: Self-pay | Admitting: Family Medicine

## 2018-07-04 ENCOUNTER — Encounter: Payer: Self-pay | Admitting: Family Medicine

## 2018-07-04 DIAGNOSIS — E559 Vitamin D deficiency, unspecified: Secondary | ICD-10-CM

## 2018-07-04 MED ORDER — VITAMIN D (ERGOCALCIFEROL) 1.25 MG (50000 UNIT) PO CAPS: 50000.0000 [IU] | ORAL_CAPSULE | ORAL | 3 refills | Status: DC

## 2018-07-11 ENCOUNTER — Other Ambulatory Visit: Payer: Self-pay

## 2018-07-11 DIAGNOSIS — E559 Vitamin D deficiency, unspecified: Secondary | ICD-10-CM

## 2018-07-11 MED ORDER — VITAMIN D (ERGOCALCIFEROL) 1.25 MG (50000 UNIT) PO CAPS: 50000.0000 [IU] | ORAL_CAPSULE | ORAL | 3 refills | Status: DC

## 2018-07-12 ENCOUNTER — Telehealth: Payer: Self-pay

## 2018-07-12 NOTE — Telephone Encounter (Signed)
Received call from Anthony Villanueva stating that after several attempts they are unable to contact the patient for delivery of Xtandi. Unable to contact patient so called listed contact Anthony Villanueva and she stated that the patient lost his phone so she is the current contact. The contact number for [employer] was provided and she was encouraged to call to set up delivery of the medications and to provide her number as the contact for the pharmacy.

## 2018-07-13 NOTE — Telephone Encounter (Signed)
Oral Oncology Patient Advocate Encounter  Received notification from [facility] Patient Assistance program that patient has been successfully enrolled into their program to receive Xtandi from the manufacturer at $0 out of pocket until 07/05/19.    I called and left a message with a family member.  He knows we will have to re-apply.   Patient knows to call the office with questions or concerns.   Oral Oncology Clinic will continue to follow.  [place] [facility] [address] Phone [phone] Fax [phone] 07/13/2018    3:17 PM

## 2018-07-20 NOTE — Telephone Encounter (Signed)
Oncology Patient Advocate Encounter:  Received phone call from [facility] Patient Assistance program, patient's shipment of Gillermina Phy was mailed out yesterday and is expected to deliver today, 07/20/18, via Fed[employer] to patient's home.  Phone# [phone]  8:43 AM Beatriz Chancellor, CPhT

## 2018-07-26 ENCOUNTER — Ambulatory Visit: Payer: Self-pay | Admitting: Family Medicine

## 2018-07-27 ENCOUNTER — Telehealth: Payer: Self-pay | Admitting: *Deleted

## 2018-07-27 NOTE — Telephone Encounter (Signed)
I contacted Pt and he is enrolled with the transportation program and he is set up for his appointment on 8/20

## 2018-08-16 ENCOUNTER — Other Ambulatory Visit: Payer: Self-pay

## 2018-08-16 DIAGNOSIS — C61 Malignant neoplasm of prostate: Secondary | ICD-10-CM

## 2018-08-16 MED ORDER — ENZALUTAMIDE 40 MG PO CAPS: 160.0000 mg | ORAL_CAPSULE | Freq: Every day | ORAL | 0 refills | Status: DC

## 2018-08-28 ENCOUNTER — Telehealth: Payer: Self-pay

## 2018-08-28 NOTE — Telephone Encounter (Signed)
Received message from patient. Attempted to return call x3. No answer and no voicemail.

## 2018-09-19 ENCOUNTER — Other Ambulatory Visit: Payer: Self-pay

## 2018-09-19 ENCOUNTER — Encounter: Payer: Self-pay | Admitting: Family Medicine

## 2018-09-19 ENCOUNTER — Ambulatory Visit (INDEPENDENT_AMBULATORY_CARE_PROVIDER_SITE_OTHER): Payer: Medicaid Other | Admitting: Family Medicine

## 2018-09-19 VITALS — BP 126/82 | HR 90 | Temp 98.4°F | Ht 72.0 in | Wt 150.0 lb

## 2018-09-19 DIAGNOSIS — K921 Melena: Secondary | ICD-10-CM

## 2018-09-19 DIAGNOSIS — M25551 Pain in right hip: Secondary | ICD-10-CM

## 2018-09-19 DIAGNOSIS — C61 Malignant neoplasm of prostate: Secondary | ICD-10-CM

## 2018-09-19 DIAGNOSIS — R197 Diarrhea, unspecified: Secondary | ICD-10-CM

## 2018-09-19 DIAGNOSIS — I10 Essential (primary) hypertension: Secondary | ICD-10-CM

## 2018-09-19 DIAGNOSIS — Z09 Encounter for follow-up examination after completed treatment for conditions other than malignant neoplasm: Secondary | ICD-10-CM | POA: Diagnosis not present

## 2018-09-19 DIAGNOSIS — M25552 Pain in left hip: Secondary | ICD-10-CM

## 2018-09-19 DIAGNOSIS — R8290 Unspecified abnormal findings in urine: Secondary | ICD-10-CM

## 2018-09-19 DIAGNOSIS — Z9114 Patient's other noncompliance with medication regimen: Secondary | ICD-10-CM

## 2018-09-19 DIAGNOSIS — K529 Noninfective gastroenteritis and colitis, unspecified: Secondary | ICD-10-CM

## 2018-09-19 DIAGNOSIS — Z91148 Patient's other noncompliance with medication regimen for other reason: Secondary | ICD-10-CM

## 2018-09-19 DIAGNOSIS — E559 Vitamin D deficiency, unspecified: Secondary | ICD-10-CM | POA: Diagnosis not present

## 2018-09-19 LAB — POCT URINALYSIS DIP (MANUAL ENTRY)
Bilirubin, UA: NEGATIVE
Glucose, UA: NEGATIVE mg/dL
Ketones, POC UA: NEGATIVE mg/dL
Leukocytes, UA: NEGATIVE
Nitrite, UA: NEGATIVE
Protein Ur, POC: 30 mg/dL — AB
Spec Grav, UA: 1.015 (ref 1.010–1.025)
Urobilinogen, UA: 0.2 E.U./dL
pH, UA: 5.5 (ref 5.0–8.0)

## 2018-09-19 MED ORDER — VITAMIN D (ERGOCALCIFEROL) 1.25 MG (50000 UNIT) PO CAPS: 50000.0000 [IU] | ORAL_CAPSULE | ORAL | 3 refills | Status: DC

## 2018-09-19 MED ORDER — AMLODIPINE BESYLATE 2.5 MG PO TABS: 2.5000 mg | ORAL_TABLET | Freq: Every day | ORAL | 3 refills | Status: DC

## 2018-09-19 MED ORDER — HYDROCHLOROTHIAZIDE 25 MG PO TABS: 25.0000 mg | ORAL_TABLET | Freq: Every day | ORAL | 5 refills | Status: DC

## 2018-09-19 MED ORDER — KETOROLAC TROMETHAMINE 60 MG/2ML IM SOLN
60.0000 mg | Freq: Once | INTRAMUSCULAR | Status: AC
  Administered 2018-09-19: 60 mg via INTRAMUSCULAR

## 2018-09-19 MED FILL — HYDROCHLOROTHIAZIDE 25 MG T: 25 | 30 days supply | Qty: 30 | Fill #0

## 2018-09-19 MED FILL — VIT D2 1.25 MG (50,000 UNIT: 1.25 MG | 84 days supply | Qty: 12 | Fill #0

## 2018-09-19 MED FILL — METHOCARBAMOL 500 MG TABS: 500 | 30 days supply | Qty: 60 | Fill #0

## 2018-09-19 MED FILL — AMLODIPINE BESYLATE 2.5 MG: 2.5 | 30 days supply | Qty: 30 | Fill #0

## 2018-09-19 NOTE — Progress Notes (Signed)
Patient [facility]   Established Patient Office Visit  Subjective:  Patient ID: Anthony Villanueva, male    DOB: [birth year]  Age: 63 y.o. MRN: [id number]  CC:  Chief Complaint  Patient presents with  . Follow-up    chronic condition     HPI Anthony Villanueva is a 63 year old male who presents for follow up.   Past Medical History:  Diagnosis Date  . Bone metastases ([place])   . Cancer [facility])    prostate  . Childhood asthma    "no longer gives me any problems" 02/10/18  . Chronic lower back pain   . Hypertension   . Loose stools   . Prostate cancer ([place])   . Vitamin D deficiency    Current Status: Since her last office visit, he is doing well with no complaints. He denies visual changes, chest pain, cough, shortness of breath, heart palpitations, and falls. He has occasional headaches and dizziness with position changes. Denies severe headaches, confusion, seizures, double vision, and blurred vision, nausea and vomiting. He continues to follow up with Oncology for history of Prostate Cancer. He has follow up appointment with Dr. Alen Blew in 10/2018, where he receives Xtandi injections every 4 months. He continues to have increased episodes of diarrhea, in which he was referred to GI last year. He wears Depends for protection of increased episodes. He has chronic right leg pain, which he takes Acetaminophen that is not effective in relieving pain. He has not been able afford Methocarbamol. He continues to use Marijuana, alcohol, and he smokes 1 pack of cigarettes a day. He just began to receive his disability benefits.   She denies fevers, chills, fatigue, recent infections, weight loss, and night sweats. She has not had any headaches, visual changes, dizziness, and falls. No chest pain, heart palpitations, cough and shortness of breath reported. No reports of GI problems such as nausea, vomiting, and constipation. She has no reports of blood in stools, dysuria  and hematuria. No depression or anxiety, and denies suicidal ideations, homicidal ideations, or auditory hallucinations. She denies pain today.    Past Surgical History:  Procedure Laterality Date  . MULTIPLE EXTRACTIONS WITH ALVEOLOPLASTY N/A 02/22/2018   Procedure: MULTIPLE EXTRACTION WITH ALVEOLOPLASTY;  Surgeon: Lenn Cal, DDS;  Location: [place];  Service: Oral Surgery;  Laterality: N/A;  NASAL TUBE  . PROSTATE BIOPSY  05/11/2017   CT core bx L retroperitoneal LAN Archie Endo 05/11/2017    Family History  Problem Relation Age of Onset  . Cancer Mother     Social History   Socioeconomic History  . Marital status: Significant Other    Spouse name: Not on file  . Number of children: 5  . Years of education: Not on file  . Highest education level: Not on file  Occupational History  . Not on file  Social Needs  . Financial resource strain: Not on file  . Food insecurity    Worry: Not on file    Inability: Not on file  . Transportation needs    Medical: Not on file    Non-medical: Not on file  Tobacco Use  . Smoking status: Current Some Day Smoker    Packs/day: 0.25    Years: 43.00    Pack years: 10.75    Types: Cigarettes    Last attempt to quit: 02/08/2018    Years since quitting: 0.6  . Smokeless tobacco: Never Used  Substance and Sexual Activity  .  Alcohol use: Yes    Comment: 02/10/2018 "2 - 40oz beer/day"  . Drug use: Yes    Frequency: 7.0 times per week    Types: Marijuana    Comment: daily use  . Sexual activity: Not Currently  Lifestyle  . Physical activity    Days per week: Not on file    Minutes per session: Not on file  . Stress: Not on file  Relationships  . Social Herbalist on phone: Not on file    Gets together: Not on file    Attends religious service: Not on file    Active member of club or organization: Not on file    Attends meetings of clubs or organizations: Not on file    Relationship status: Not on file  . Intimate partner  violence    Fear of current or ex partner: Not on file    Emotionally abused: Not on file    Physically abused: Not on file    Forced sexual activity: Not on file  Other Topics Concern  . Not on file  Social History Narrative  . Not on file    Outpatient Medications Prior to Visit  Medication Sig Dispense Refill  . enzalutamide (XTANDI) 40 MG capsule Take 4 capsules (160 mg total) by mouth daily. 120 capsule 0  . loperamide (IMODIUM A-D) 2 MG tablet Take 1 tablet (2 mg total) by mouth 4 (four) times daily as needed for diarrhea or loose stools. 30 tablet 3  . amLODipine (NORVASC) 2.5 MG tablet Take 1 tablet (2.5 mg total) by mouth daily. 180 tablet 3  . hydrochlorothiazide (HYDRODIURIL) 25 MG tablet Take 25 mg by mouth daily.    . calcium-vitamin D (OSCAL WITH D) 500-200 MG-UNIT tablet Take 1 tablet by mouth 2 (two) times daily. (Patient not taking: Reported on 06/26/2018) 90 tablet 3  . methocarbamol (ROBAXIN) 500 MG tablet Take 1 tablet (500 mg total) by mouth 2 (two) times a day. (Patient not taking: Reported on 09/19/2018) 60 tablet 2  . Vitamin D, Ergocalciferol, (DRISDOL) 1.25 MG (50000 UT) CAPS capsule Take 1 capsule (50,000 Units total) by mouth every 7 (seven) days. (Patient not taking: Reported on 09/19/2018) 5 capsule 3   No facility-administered medications prior to visit.     No Known Allergies  ROS Review of Systems  Constitutional: Negative.   HENT: Negative.   Eyes: Negative.   Respiratory: Negative.   Cardiovascular: Negative.   Gastrointestinal: Positive for diarrhea (frequent).  Endocrine: Negative.   Genitourinary: Negative.   Musculoskeletal: Positive for arthralgias (generalized chronic pain).       Chronic bilateral hip pain.  Skin: Negative.   Allergic/Immunologic: Negative.   Neurological: Positive for dizziness (occasional ) and headaches (Occasional ).  Hematological: Negative.   Psychiatric/Behavioral: Negative.    Objective:    Physical Exam   Constitutional: He is oriented to person, place, and time. He appears well-developed and well-nourished.  HENT:  Head: Normocephalic and atraumatic.  Eyes: Conjunctivae are normal.  Neck: Normal range of motion. Neck supple.  Cardiovascular: Normal rate, regular rhythm, normal heart sounds and intact distal pulses.  Pulmonary/Chest: Effort normal and breath sounds normal.  Abdominal: Soft. Bowel sounds are normal.  Musculoskeletal:     Comments: Decreased ROM in right leg and hip.  Neurological: He is alert and oriented to person, place, and time. He has normal reflexes.  Skin: Skin is warm and dry.  Psychiatric: He has a normal mood and affect.  His behavior is normal. Judgment and thought content normal.  Nursing note and vitals reviewed.   BP 126/82 (BP Location: Left Arm, Patient Position: Sitting, Cuff Size: Small)   Pulse 90   Temp 98.4 F (36.9 C) (Oral)   Ht 6' (1.829 m)   Wt 150 lb (68 kg)   SpO2 100%   BMI 20.34 kg/m  Wt Readings from Last 3 Encounters:  09/19/18 150 lb (68 kg)  06/26/18 155 lb 12.8 oz (70.7 kg)  06/06/18 153 lb 6.4 oz (69.6 kg)     Health Maintenance Due  Topic Date Due  . Hepatitis C Screening  Jul 06, 1955  . COLONOSCOPY  05/25/2007  . INFLUENZA VACCINE  09/09/2018    There are no preventive care reminders to display for this patient.  Lab Results  Component Value Date   TSH 1.570 06/26/2018   Lab Results  Component Value Date   WBC 5.0 06/06/2018   HGB 13.1 06/06/2018   HCT 38.9 (L) 06/06/2018   MCV 96.5 06/06/2018   PLT 331 06/06/2018   Lab Results  Component Value Date   NA 138 06/06/2018   K 3.9 06/06/2018   CO2 26 06/06/2018   GLUCOSE 80 06/06/2018   BUN 5 (L) 06/06/2018   CREATININE 0.77 06/06/2018   BILITOT 0.4 06/06/2018   ALKPHOS 81 06/06/2018   AST 10 (L) 06/06/2018   ALT <6 06/06/2018   PROT 7.1 06/06/2018   ALBUMIN 3.3 (L) 06/06/2018   CALCIUM 9.1 06/06/2018   ANIONGAP 9 06/06/2018   Lab Results  Component  Value Date   CHOL 176 04/25/2018   Lab Results  Component Value Date   HDL 63 04/25/2018   Lab Results  Component Value Date   LDLCALC 90 04/25/2018   Lab Results  Component Value Date   TRIG 117 04/25/2018   Lab Results  Component Value Date   CHOLHDL 2.8 04/25/2018   No results found for: HGBA1C    Assessment & Plan:   1. Chronic diarrhea Stable today. He will continue to use Imodium as needed.  - Ambulatory referral to Gastroenterology  2. Diarrhea, unspecified type - Ambulatory referral to Gastroenterology  3. Right hip pain We initiated Ketorolac injection in office today.  - ketorolac (TORADOL) injection 60 mg  4. Bilateral hip pain  5. Pain of both hip joints  6. Vitamin D deficiency - Vitamin D, Ergocalciferol, (DRISDOL) 1.25 MG (50000 UT) CAPS capsule; Take 1 capsule (50,000 Units total) by mouth every 7 (seven) days.  Dispense: 5 capsule; Refill: 3  7. Essential hypertension The current medical regimen is effective; blood pressure is stable at 126/82 today; continue present plan and medications as prescribed. He will continue to decrease high sodium intake, excessive alcohol intake, increase potassium intake, smoking cessation, and increase physical activity of at least 30 minutes of cardio activity daily. He will continue to follow Heart Healthy or DASH diet. - amLODipine (NORVASC) 2.5 MG tablet; Take 1 tablet (2.5 mg total) by mouth daily.  Dispense: 30 tablet; Refill: 3 - hydrochlorothiazide (HYDRODIURIL) 25 MG tablet; Take 1 tablet (25 mg total) by mouth daily.  Dispense: 30 tablet; Refill: 5  8. Prostate cancer [facility]) Continue to follow up with Dr. Alen Blew, with Gillermina Phy injections every 4 months.   9. Blood in stool - Ambulatory referral to Gastroenterology  10. Abnormal urinalysis Results are pending.  - Urine Culture  11.History of noncompliance of medications Unable to afford medications. [place] and patient is  eligible for a one time free refill of all medications. Patient is aware.   12. Follow up He will follow up in 3 months.  - POCT urinalysis dipstick  Meds ordered this encounter  Medications  . ketorolac (TORADOL) injection 60 mg  . amLODipine (NORVASC) 2.5 MG tablet    Sig: Take 1 tablet (2.5 mg total) by mouth daily.    Dispense:  30 tablet    Refill:  3  . hydrochlorothiazide (HYDRODIURIL) 25 MG tablet    Sig: Take 1 tablet (25 mg total) by mouth daily.    Dispense:  30 tablet    Refill:  5  . Vitamin D, Ergocalciferol, (DRISDOL) 1.25 MG (50000 UT) CAPS capsule    Sig: Take 1 capsule (50,000 Units total) by mouth every 7 (seven) days.    Dispense:  5 capsule    Refill:  3    Problem List Items Addressed This Visit      Genitourinary   Prostate cancer ([place])     Other   Bilateral hip pain    Other Visit Diagnoses    Chronic diarrhea    -  Primary   Relevant Orders   Ambulatory referral to Gastroenterology   Diarrhea, unspecified type       Relevant Orders   Ambulatory referral to Gastroenterology   Right hip pain       Relevant Medications   ketorolac (TORADOL) injection 60 mg (Completed)   Pain of both hip joints       Vitamin D deficiency       Relevant Medications   Vitamin D, Ergocalciferol, (DRISDOL) 1.25 MG (50000 UT) CAPS capsule   Essential hypertension       Relevant Medications   amLODipine (NORVASC) 2.5 MG tablet   hydrochlorothiazide (HYDRODIURIL) 25 MG tablet   Blood in stool       Relevant Orders   Ambulatory referral to Gastroenterology   Abnormal urinalysis       Relevant Orders   Urine Culture   Follow up       Relevant Orders   POCT urinalysis dipstick (Completed)      Meds ordered this encounter  Medications  . ketorolac (TORADOL) injection 60 mg  . amLODipine (NORVASC) 2.5 MG tablet    Sig: Take 1 tablet (2.5 mg total) by mouth daily.    Dispense:  30 tablet    Refill:  3  . hydrochlorothiazide (HYDRODIURIL) 25 MG tablet     Sig: Take 1 tablet (25 mg total) by mouth daily.    Dispense:  30 tablet    Refill:  5  . Vitamin D, Ergocalciferol, (DRISDOL) 1.25 MG (50000 UT) CAPS capsule    Sig: Take 1 capsule (50,000 Units total) by mouth every 7 (seven) days.    Dispense:  5 capsule    Refill:  3    Follow-up: Return in about 3 months (around 12/20/2018).    Azzie Glatter, FNP

## 2018-09-19 NOTE — Progress Notes (Signed)
S 

## 2018-09-20 DIAGNOSIS — Z9114 Patient's other noncompliance with medication regimen: Secondary | ICD-10-CM | POA: Insufficient documentation

## 2018-09-20 DIAGNOSIS — I10 Essential (primary) hypertension: Secondary | ICD-10-CM | POA: Insufficient documentation

## 2018-09-20 DIAGNOSIS — Z91148 Patient's other noncompliance with medication regimen for other reason: Secondary | ICD-10-CM | POA: Insufficient documentation

## 2018-09-20 DIAGNOSIS — K529 Noninfective gastroenteritis and colitis, unspecified: Secondary | ICD-10-CM | POA: Insufficient documentation

## 2018-09-21 LAB — URINE CULTURE: Organism ID, Bacteria: NO GROWTH

## 2018-09-26 ENCOUNTER — Telehealth: Payer: Self-pay | Admitting: Family Medicine

## 2018-09-26 NOTE — Telephone Encounter (Signed)
Called, no answer. Left a message that He should call [facility] to get an asap appointment. And Their number was provided. Thanks!

## 2018-09-27 ENCOUNTER — Other Ambulatory Visit: Payer: Self-pay | Admitting: Oncology

## 2018-09-27 DIAGNOSIS — C61 Malignant neoplasm of prostate: Secondary | ICD-10-CM

## 2018-09-28 ENCOUNTER — Emergency Department (HOSPITAL_COMMUNITY)
Admission: EM | Admit: 2018-09-28 | Discharge: 2018-09-28 | Disposition: A | Discharge status: Home / Self Care | Payer: Medicaid Other | Attending: Emergency Medicine | Admitting: Emergency Medicine

## 2018-09-28 ENCOUNTER — Emergency Department (HOSPITAL_COMMUNITY): Payer: Medicaid Other

## 2018-09-28 ENCOUNTER — Encounter (HOSPITAL_COMMUNITY): Payer: Self-pay | Admitting: Emergency Medicine

## 2018-09-28 ENCOUNTER — Inpatient Hospital Stay: Payer: Medicaid Other | Attending: Oncology | Admitting: Oncology

## 2018-09-28 ENCOUNTER — Other Ambulatory Visit: Payer: Self-pay

## 2018-09-28 ENCOUNTER — Inpatient Hospital Stay: Payer: Medicaid Other

## 2018-09-28 VITALS — BP 150/84 | HR 102 | Temp 98.5°F | Resp 17 | Ht 73.0 in | Wt 146.7 lb

## 2018-09-28 DIAGNOSIS — J45909 Unspecified asthma, uncomplicated: Secondary | ICD-10-CM | POA: Diagnosis not present

## 2018-09-28 DIAGNOSIS — F17210 Nicotine dependence, cigarettes, uncomplicated: Secondary | ICD-10-CM | POA: Insufficient documentation

## 2018-09-28 DIAGNOSIS — I10 Essential (primary) hypertension: Secondary | ICD-10-CM | POA: Insufficient documentation

## 2018-09-28 DIAGNOSIS — M549 Dorsalgia, unspecified: Secondary | ICD-10-CM | POA: Insufficient documentation

## 2018-09-28 DIAGNOSIS — Z79899 Other long term (current) drug therapy: Secondary | ICD-10-CM | POA: Insufficient documentation

## 2018-09-28 DIAGNOSIS — R200 Anesthesia of skin: Secondary | ICD-10-CM | POA: Diagnosis not present

## 2018-09-28 DIAGNOSIS — C7951 Secondary malignant neoplasm of bone: Secondary | ICD-10-CM | POA: Insufficient documentation

## 2018-09-28 DIAGNOSIS — Z5111 Encounter for antineoplastic chemotherapy: Secondary | ICD-10-CM | POA: Insufficient documentation

## 2018-09-28 DIAGNOSIS — C61 Malignant neoplasm of prostate: Secondary | ICD-10-CM

## 2018-09-28 DIAGNOSIS — M79604 Pain in right leg: Secondary | ICD-10-CM | POA: Diagnosis not present

## 2018-09-28 DIAGNOSIS — R634 Abnormal weight loss: Secondary | ICD-10-CM | POA: Diagnosis not present

## 2018-09-28 LAB — CMP (CANCER CENTER ONLY)
ALT: <6 U/L (ref 0–44)
AST: 13 U/L — ABNORMAL LOW (ref 15–41)
Albumin: 3.2 g/dL — ABNORMAL LOW (ref 3.5–5.0)
Alkaline Phosphatase: 109 U/L (ref 38–126)
Anion gap: 12 (ref 5–15)
BUN: 10 mg/dL (ref 8–23)
CO2: 23 mmol/L (ref 22–32)
Calcium: 9.3 mg/dL (ref 8.9–10.3)
Chloride: 99 mmol/L (ref 98–111)
Creatinine: 0.86 mg/dL (ref 0.61–1.24)
GFR, Est AFR Am: >60 mL/min (ref >60)
GFR, Estimated: >60 mL/min (ref >60)
Glucose, Bld: 88 mg/dL (ref 70–99)
Potassium: 3.3 mmol/L — ABNORMAL LOW (ref 3.5–5.1)
Sodium: 134 mmol/L — ABNORMAL LOW (ref 135–145)
Total Bilirubin: 0.7 mg/dL (ref 0.3–1.2)
Total Protein: 7.7 g/dL (ref 6.5–8.1)

## 2018-09-28 LAB — CBC WITH DIFFERENTIAL (CANCER CENTER ONLY)
Abs Immature Granulocytes: 0.03 10*3/uL (ref 0.00–0.07)
Basophils Absolute: 0 10*3/uL (ref 0.0–0.1)
Basophils Relative: 0 %
Eosinophils Absolute: 0 10*3/uL (ref 0.0–0.5)
Eosinophils Relative: 0 %
HCT: 34.4 % — ABNORMAL LOW (ref 39.0–52.0)
Hemoglobin: 11.2 g/dL — ABNORMAL LOW (ref 13.0–17.0)
Immature Granulocytes: 1 %
Lymphocytes Relative: 15 %
Lymphs Abs: 0.8 10*3/uL (ref 0.7–4.0)
MCH: 29.9 pg (ref 26.0–34.0)
MCHC: 32.6 g/dL (ref 30.0–36.0)
MCV: 92 fL (ref 80.0–100.0)
Monocytes Absolute: 0.5 10*3/uL (ref 0.1–1.0)
Monocytes Relative: 10 %
Neutro Abs: 3.8 10*3/uL (ref 1.7–7.7)
Neutrophils Relative %: 74 %
Platelet Count: 399 10*3/uL (ref 150–400)
RBC: 3.74 MIL/uL — ABNORMAL LOW (ref 4.22–5.81)
RDW: 14.7 % (ref 11.5–15.5)
WBC Count: 5.1 10*3/uL (ref 4.0–10.5)
nRBC: 0 % (ref 0.0–0.2)

## 2018-09-28 MED ORDER — OXYCODONE HCL 5 MG PO TABS: 5.0000 mg | ORAL_TABLET | Freq: Four times a day (QID) | ORAL | 0 refills | Status: AC | PRN

## 2018-09-28 MED ORDER — LEUPROLIDE ACETATE (4 MONTH) 30 MG IM KIT
30.0000 mg | PACK | Freq: Once | INTRAMUSCULAR | Status: AC
  Administered 2018-09-28: 30 mg via INTRAMUSCULAR
  Filled 2018-09-28: qty 30

## 2018-09-28 MED ORDER — OXYCODONE HCL 5 MG PO TABS
5.0000 mg | ORAL_TABLET | Freq: Once | ORAL | Status: AC
  Administered 2018-09-28: 5 mg via ORAL
  Filled 2018-09-28: qty 1

## 2018-09-28 NOTE — ED Provider Notes (Addendum)
[facility] DEPT Provider Note   CSN: [id number] Arrival date & time: 09/28/18  1214     History   Chief Complaint Chief Complaint  Patient presents with  . Leg Pain  . Numbness    HPI Anthony Villanueva is a 63 y.o. male.     Patient with history of prostate cancer with bone metastases presents the emergency department today with complaint of worsening right leg pain in the thigh area over the past week or so.  In addition patient has had some numbness that occurs intermittently in his right calf.  He denies any back pain that is new or changed.  Patient saw his oncologist prior to his ED visit today.  They are scheduling a bone scan to determine if he has any other bony metastases.  Patient states that he is unable to walk short distances before his right leg feels very weak like it is going to give out.  Pain occurs at rest and with activity.  He denies any color change in the leg.  He has been taking Tylenol without improvement.  He was prescribed methocarbamol by his primary care doctor which she has been taking without much improvement.  Patient denies any fevers, chest pain or shortness of breath.  No abdominal pain or urinary symptoms.  Patient denies other warning symptoms of back pain including: fecal incontinence (reports ongoing diarrhea for a year), urinary retention or overflow incontinence, night sweats, waking from sleep with back pain, unexplained fevers or weight loss, IVDU, recent trauma.        Past Medical History:  Diagnosis Date  . Bone metastases ([place])   . Cancer [facility])    prostate  . Childhood asthma    "no longer gives me any problems" 02/10/18  . Chronic lower back pain   . Hypertension   . Loose stools   . Prostate cancer ([place])   . Vitamin D deficiency     Patient Active Problem List   Diagnosis Date Noted  . Chronic diarrhea 09/20/2018  . Essential hypertension 09/20/2018  . History of medication noncompliance 09/20/2018   . Bilateral hip pain 06/26/2018  . Loose stools 06/26/2018  . Prostate cancer ([place]) 05/19/2017  . Pelvic mass 05/10/2017  . Constipation 05/10/2017  . Weight loss, unintentional 05/10/2017  . Hematuria 05/10/2017    Past Surgical History:  Procedure Laterality Date  . MULTIPLE EXTRACTIONS WITH ALVEOLOPLASTY N/A 02/22/2018   Procedure: MULTIPLE EXTRACTION WITH ALVEOLOPLASTY;  Surgeon: Lenn Cal, DDS;  Location: [place];  Service: Oral Surgery;  Laterality: N/A;  NASAL TUBE  . PROSTATE BIOPSY  05/11/2017   CT core bx L retroperitoneal LAN Archie Endo 05/11/2017        Home Medications    Prior to Admission medications   Medication Sig Start Date End Date Taking? Authorizing Provider  amLODipine (NORVASC) 2.5 MG tablet Take 1 tablet (2.5 mg total) by mouth daily. 09/19/18 01/17/19 Yes Azzie Glatter, FNP  calcium-vitamin D (OSCAL WITH D) 500-200 MG-UNIT tablet Take 1 tablet by mouth 2 (two) times daily. 12/02/17  Yes Wyatt Portela, MD  hydrochlorothiazide (HYDRODIURIL) 25 MG tablet Take 1 tablet (25 mg total) by mouth daily. 09/19/18  Yes Azzie Glatter, FNP  loperamide (IMODIUM A-D) 2 MG tablet Take 1 tablet (2 mg total) by mouth 4 (four) times daily as needed for diarrhea or loose stools. 06/26/18  Yes Azzie Glatter, FNP  methocarbamol (ROBAXIN) 500 MG tablet Take 1 tablet (500 mg  total) by mouth 2 (two) times a day. 06/26/18  Yes Azzie Glatter, FNP  XTANDI 40 MG capsule Take 4 capsules (160 mg total) by mouth daily. Patient taking differently: Take 160 mg by mouth daily.  09/27/18  Yes Wyatt Portela, MD  Vitamin D, Ergocalciferol, (DRISDOL) 1.25 MG (50000 UT) CAPS capsule Take 1 capsule (50,000 Units total) by mouth every 7 (seven) days. 09/19/18   Azzie Glatter, FNP    Family History Family History  Problem Relation Age of Onset  . Cancer Mother     Social History Social History   Tobacco Use  . Smoking status: Current Some Day Smoker    Packs/day: 0.25     Years: 43.00    Pack years: 10.75    Types: Cigarettes    Last attempt to quit: 02/08/2018    Years since quitting: 0.6  . Smokeless tobacco: Never Used  Substance Use Topics  . Alcohol use: Yes    Comment: 02/10/2018 "2 - 40oz beer/day"  . Drug use: Yes    Frequency: 7.0 times per week    Types: Marijuana    Comment: daily use     Allergies   Patient has no known allergies.   Review of Systems Review of Systems  Constitutional: Negative for fever and unexpected weight change.  Gastrointestinal: Positive for diarrhea. Negative for constipation.       Neg for fecal incontinence  Genitourinary: Negative for difficulty urinating, flank pain and hematuria.       Negative for urinary incontinence or retention  Musculoskeletal: Positive for arthralgias, gait problem and myalgias. Negative for back pain and joint swelling.  Neurological: Positive for numbness. Negative for weakness.       Negative for saddle paresthesias      Physical Exam Updated Vital Signs BP (!) 158/91 (BP Location: Right Arm)   Pulse 82   Temp 98.2 F (36.8 C) (Oral)   Resp 17   SpO2 100%   Physical Exam Vitals signs and nursing note reviewed.  Constitutional:      Appearance: He is well-developed.  HENT:     Head: Normocephalic and atraumatic.  Eyes:     Conjunctiva/sclera: Conjunctivae normal.  Neck:     Musculoskeletal: Normal range of motion.  Cardiovascular:     Pulses:          Dorsalis pedis pulses are 2+ on the right side and 2+ on the left side.  Abdominal:     Palpations: Abdomen is soft.     Tenderness: There is no abdominal tenderness.  Musculoskeletal: Normal range of motion.        General: No tenderness.     Right hip: Normal.     Left hip: Normal.     Right knee: Normal.     Right ankle: Normal.     Cervical back: He exhibits normal range of motion, no tenderness and no bony tenderness.     Thoracic back: He exhibits normal range of motion, no tenderness and no bony  tenderness.     Lumbar back: He exhibits normal range of motion, no tenderness and no bony tenderness.     Right upper leg: He exhibits no tenderness, no bony tenderness and no swelling.     Right lower leg: He exhibits no tenderness and no bony tenderness.     Comments: No step-off noted with palpation of spine.   Skin:    General: Skin is warm and dry.  Neurological:  Mental Status: He is alert.     Sensory: No sensory deficit.     Motor: No abnormal muscle tone.     Deep Tendon Reflexes: Reflexes are normal and symmetric.     Comments: 5/5 strength in entire lower extremities bilaterally. No sensation deficit.       ED Treatments / Results  Labs (all labs ordered are listed, but only abnormal results are displayed) Labs Reviewed - No data to display  EKG None  Radiology Dg Lumbar Spine Complete  Result Date: 09/28/2018 CLINICAL DATA:  Low back and right proximal leg pain for the past 2 days. No known injury. EXAM: LUMBAR SPINE - COMPLETE 4+ VIEW COMPARISON:  None. FINDINGS: Five non-rib-bearing lumbar vertebrae. Mild to moderate anterior and lateral spur formation throughout the lumbar and lower thoracic spine. Facet degenerative changes in the lower lumbar spine. No fractures, pars defects or subluxations. Atheromatous arterial calcifications. IMPRESSION: Multilevel degenerative changes. No acute abnormality. Electronically Signed   By: Claudie Revering M.D.   On: 09/28/2018 14:39   Dg Femur Min 2 Views Right  Result Date: 09/28/2018 CLINICAL DATA:  63 year old male with history of prostate cancer presenting with leg pain. EXAM: RIGHT FEMUR 2 VIEWS COMPARISON:  None. FINDINGS: There is no acute fracture or dislocation. Moderate arthritic changes of the right hip as well as arthritic changes of the right knee with joint space narrowing and meniscal chondrocalcinosis primarily involving the lateral compartment. No joint effusion. No suspicious bone lesion. The soft tissues are  unremarkable. IMPRESSION: 1. No acute fracture or dislocation. 2. No suspicious bone lesion. Electronically Signed   By: Anner Crete M.D.   On: 09/28/2018 14:38    Procedures Procedures (including critical care time)  Medications Ordered in ED Medications  oxyCODONE (Oxy IR/ROXICODONE) immediate release tablet 5 mg (5 mg Oral Given 09/28/18 1415)     Initial Impression / Assessment and Plan / ED Course  I have reviewed the triage vital signs and the nursing notes.  Pertinent labs & imaging results that were available during my care of the patient were reviewed by me and considered in my medical decision making (see chart for details).        Patient seen and examined. Work-up initiated. Medications ordered.  Will check plain films of the lower back given numbness as well as plain film of the femur area to ensure no significant bony lesions or pathologic fractures.  Vital signs reviewed and are as follows: BP (!) 158/91 (BP Location: Right Arm)   Pulse 82   Temp 98.2 F (36.8 C) (Oral)   Resp 17   SpO2 100%   4:17 PM x-ray reports reviewed.  Patient states that he is feeling better after treatment of pain and pain is now tolerable.  We discussed x-ray results today.  We discussed the importance of needing to follow-up for bone scan as prescribed by his doctor.  Will give #10 oxycodone 5 mg tablets to help with pain temporarily until bone scan is completed.  Reviewed [facility] substance reporting database.  Patient counseled on use of narcotic pain medications. Counseled not to combine these medications with others containing tylenol. Urged not to drink alcohol, drive, or perform any other activities that requires focus while taking these medications. The patient verbalizes understanding and agrees with the plan.   Final Clinical Impressions(s) / ED Diagnoses   Final diagnoses:  Pain of right lower extremity  Numbness in right leg   Patient with worsening right lower  extremity pain and some numbness without overt weakness here in emergency department today.  X-ray imaging without any concerning lesions or pathologic fractures today.  Pain is controlled.  I do not see any reason patient will need emergent MRI of the back today.  He has close follow-up with his oncologist who is planning for a bone scan in the near future.   ED Discharge Orders         Ordered    oxyCODONE (ROXICODONE) 5 MG immediate release tablet  Every 6 hours PRN     09/28/18 1530           Carlisle Cater, PA-C 09/28/18 1620    Carlisle Cater, PA-C 09/28/18 1621    Lacretia Leigh, MD 09/30/18 [id number]

## 2018-09-28 NOTE — Patient Instructions (Signed)
Leuprolide injection What is this medicine? LEUPROLIDE (loo PROE lide) is a man-made hormone. It is used to treat the symptoms of prostate cancer. This medicine may also be used to treat children with early onset of puberty. It may be used for other hormonal conditions. This medicine may be used for other purposes; ask your health care provider or pharmacist if you have questions. COMMON BRAND NAME(S): Lupron What should I tell my health care provider before I take this medicine? They need to know if you have any of these conditions:  diabetes  heart disease or previous heart attack  high blood pressure  high cholesterol  pain or difficulty passing urine  spinal cord metastasis  stroke  tobacco smoker  an unusual or allergic reaction to leuprolide, benzyl alcohol, other medicines, foods, dyes, or preservatives  pregnant or trying to get pregnant  breast-feeding How should I use this medicine? This medicine is for injection under the skin or into a muscle. You will be taught how to prepare and give this medicine. Use exactly as directed. Take your medicine at regular intervals. Do not take your medicine more often than directed. It is important that you put your used needles and syringes in a special sharps container. Do not put them in a trash can. If you do not have a sharps container, call your pharmacist or healthcare provider to get one. A special MedGuide will be given to you by the pharmacist with each prescription and refill. Be sure to read this information carefully each time. Talk to your pediatrician regarding the use of this medicine in children. While this medicine may be prescribed for children as young as 8 years for selected conditions, precautions do apply. Overdosage: If you think you have taken too much of this medicine contact a poison control center or emergency room at once. NOTE: This medicine is only for you. Do not share this medicine with others. What if  I miss a dose? If you miss a dose, take it as soon as you can. If it is almost time for your next dose, take only that dose. Do not take double or extra doses. What may interact with this medicine? Do not take this medicine with any of the following medications:  chasteberry This medicine may also interact with the following medications:  herbal or dietary supplements, like black cohosh or DHEA  male hormones, like estrogens or progestins and birth control pills, patches, rings, or injections  male hormones, like testosterone This list may not describe all possible interactions. Give your health care provider a list of all the medicines, herbs, non-prescription drugs, or dietary supplements you use. Also tell them if you smoke, drink alcohol, or use illegal drugs. Some items may interact with your medicine. What should I watch for while using this medicine? Visit your doctor or health care professional for regular checks on your progress. During the first week, your symptoms may get worse, but then will improve as you continue your treatment. You may get hot flashes, increased bone pain, increased difficulty passing urine, or an aggravation of nerve symptoms. Discuss these effects with your doctor or health care professional, some of them may improve with continued use of this medicine. Male patients may experience a menstrual cycle or spotting during the first 2 months of therapy with this medicine. If this continues, contact your doctor or health care professional. This medicine may increase blood sugar. Ask your healthcare provider if changes in diet or medicines are needed if   you have diabetes. What side effects may I notice from receiving this medicine? Side effects that you should report to your doctor or health care professional as soon as possible:  allergic reactions like skin rash, itching or hives, swelling of the face, lips, or tongue  breathing problems  chest  pain  depression or memory disorders  pain in your legs or groin  pain at site where injected  severe headache  signs and symptoms of high blood sugar such as being more thirsty or hungry or having to urinate more than normal. You may also feel very tired or have blurry vision  swelling of the feet and legs  visual changes  vomiting Side effects that usually do not require medical attention (report to your doctor or health care professional if they continue or are bothersome):  breast swelling or tenderness  decrease in sex drive or performance  diarrhea  hot flashes  loss of appetite  muscle, joint, or bone pains  nausea  redness or irritation at site where injected  skin problems or acne This list may not describe all possible side effects. Call your doctor for medical advice about side effects. You may report side effects to FDA at 1-800-FDA-1088. Where should I keep my medicine? Keep out of the reach of children. Store below 25 degrees C (77 degrees F). Do not freeze. Protect from light. Do not use if it is not clear or if there are particles present. Throw away any unused medicine after the expiration date. NOTE: This sheet is a summary. It may not cover all possible information. If you have questions about this medicine, talk to your doctor, pharmacist, or health care provider.  2020 Elsevier/Gold Standard (2017-11-24 09:52:48)  

## 2018-09-28 NOTE — Addendum Note (Signed)
Addended by: Wyatt Portela on: 09/28/2018 10:38 AM   Modules accepted: Orders

## 2018-09-28 NOTE — Discharge Instructions (Signed)
Please read and follow all provided instructions.  Your diagnoses today include:  1. Pain of right lower extremity   2. Numbness in right leg     Tests performed today include:  An x-ray of the affected area - does NOT show any broken bones  Vital signs. See below for your results today.   Medications prescribed:   Oxycodone - narcotic pain medication  DO NOT drive or perform any activities that require you to be awake and alert because this medicine can make you drowsy.   Take any prescribed medications only as directed.  Home care instructions:   Follow any educational materials contained in this packet  Follow R.I.C.E. Protocol:  R - rest your injury   I  - use ice on injury without applying directly to skin  C - compress injury with bandage or splint  E - elevate the injury as much as possible  Follow-up instructions: Please follow-up with Dr. Alen Blew regarding your bone scan and pain.   Return instructions:   Please return if your toes or feet are numb or tingling, appear gray or blue, or you have severe pain (also elevate the leg and loosen splint or wrap if you were given one)  Please return to the Emergency Department if you experience worsening symptoms.   Please return if you have any other emergent concerns.  Additional Information:  Your vital signs today were: BP (!) 158/91 (BP Location: Right Arm)    Pulse 82    Temp 98.2 F (36.8 C) (Oral)    Resp 17    SpO2 100%  If your blood pressure (BP) was elevated above 135/85 this visit, please have this repeated by your doctor within one month. --------------

## 2018-09-28 NOTE — ED Triage Notes (Signed)
Pt c/o right eg pain and numbness for over week. Reports that he walks about 30  Feet and will have to stop and sit down. Pt ambulatory from lobby

## 2018-09-28 NOTE — Progress Notes (Signed)
Hematology and Oncology Follow Up Visit  Anthony Villanueva [id number] Jun 29, 1955 63 y.o. 09/28/2018 10:21 AM Azzie Glatter, FNPNo ref. 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  Advanced prostate cancer with disease to the bone and adenopathy diagnosed in April 2019.  He had an elevated PSA of 193 April 2019.   Prior Therapy: He is status post CT-guided biopsy of retroperitoneal adenopathy on April 3 of 2019  Current therapy:  Lupron 30 mg every 4 months with the first dose given on May 31, 2017.  Xtandi 160 mg daily started in July 2019.  Interim History: [name] is here for a follow-up.  Since the last visit, he has been doing reasonably fair except for the last week he started developing right-sided leg pain.  He reports he has been using Robaxin regularly to control back pain and has limited its mobility.  He reports the pain is rather sharp with some numbness in his calf.  He still able to ambulate and bear weight.  He does report some hip discomfort related to immobility.  He has been taking Xtandi although has been erratic in his adherence.  He did report some weight loss and decline in his appetite.  Patient denied headaches, blurry vision, syncope or seizures.  Denies any fevers, chills or sweats.  Denied chest pain, palpitation, orthopnea or leg edema.  Denied cough, wheezing or hemoptysis.  Denied nausea, vomiting or abdominal pain.  Denies any constipation or diarrhea.  Denies any frequency urgency or hesitancy.  Denies any arthralgias or myalgias.  Denies any skin rashes or lesions.  Denies any bleeding or clotting tendency.  Denies any easy bruising.  Denies any hair or nail changes.  Denies any anxiety or depression.  Remaining review of system is negative.         Medications: Updated on review. Current Outpatient Medications  Medication Sig Dispense Refill  .  amLODipine (NORVASC) 2.5 MG tablet Take 1 tablet (2.5 mg total) by mouth daily. 30 tablet 3  . calcium-vitamin D (OSCAL WITH D) 500-200 MG-UNIT tablet Take 1 tablet by mouth 2 (two) times daily. (Patient not taking: Reported on 06/26/2018) 90 tablet 3  . hydrochlorothiazide (HYDRODIURIL) 25 MG tablet Take 1 tablet (25 mg total) by mouth daily. 30 tablet 5  . loperamide (IMODIUM A-D) 2 MG tablet Take 1 tablet (2 mg total) by mouth 4 (four) times daily as needed for diarrhea or loose stools. 30 tablet 3  . methocarbamol (ROBAXIN) 500 MG tablet Take 1 tablet (500 mg total) by mouth 2 (two) times a day. (Patient not taking: Reported on 09/19/2018) 60 tablet 2  . Vitamin D, Ergocalciferol, (DRISDOL) 1.25 MG (50000 UT) CAPS capsule Take 1 capsule (50,000 Units total) by mouth every 7 (seven) days. 5 capsule 3  . XTANDI 40 MG capsule Take 4 capsules (160 mg total) by mouth daily. 120 capsule 0   No current facility-administered medications for this visit.      Allergies: No Known Allergies  Past Medical History, Surgical history, Social history, and Family History without any changes on review.    Physical Exam:   Blood pressure (!) 150/84, pulse (!) 102, temperature 98.5 F (36.9 C), temperature source Oral, resp. rate 17, height 6\' 1"  (1.854 m), weight 146 lb 11.2 oz (66.5 kg), SpO2 100 %.     ECOG: 1   General appearance: Alert, awake without any distress. Head: Atraumatic without  abnormalities Oropharynx: Without any thrush or ulcers. Eyes: No scleral icterus. Lymph nodes: No lymphadenopathy noted in the cervical, supraclavicular, or axillary nodes Heart:regular rate and rhythm, without any murmurs or gallops.   Lung: Clear to auscultation without any rhonchi, wheezes or dullness to percussion. Abdomin: Soft, nontender without any shifting dullness or ascites. Musculoskeletal: Full range of motion noted in all joints.  Tenderness noted with his right-sided hip flexion. Neurological:  Intact on exam. Skin: No rashes or lesions.         Lab Results: Lab Results  Component Value Date   WBC 5.0 06/06/2018   HGB 13.1 06/06/2018   HCT 38.9 (L) 06/06/2018   MCV 96.5 06/06/2018   PLT 331 06/06/2018     Chemistry      Component Value Date/Time   NA 138 06/06/2018 1134   NA 137 06/17/2017 1223   K 3.9 06/06/2018 1134   CL 103 06/06/2018 1134   CO2 26 06/06/2018 1134   BUN 5 (L) 06/06/2018 1134   BUN 8 06/17/2017 1223   CREATININE 0.77 06/06/2018 1134      Component Value Date/Time   CALCIUM 9.1 06/06/2018 1134   ALKPHOS 81 06/06/2018 1134   AST 10 (L) 06/06/2018 1134   ALT <6 06/06/2018 1134   BILITOT 0.4 06/06/2018 1134      Results for Villanueva, EDELEN (MRN [id number]) as of 09/28/2018 10:18  Ref. Range 02/02/2018 08:00 06/06/2018 11:34  Prostate Specific Ag, Serum Latest Ref Range: 0.0 - 4.0 ng/mL 2.0 5.2 (H)      Impression and Plan:  63 year old man with:  1.    Advanced prostate cancer with disease to the bone and lymphadenopathy diagnosed in April 2019.  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    He has been on Xtandi since the time of diagnosis with excellent PSA response initially.  His PSA is rising as of April 2020 was up to 5.2.  It is possible that he is developing castration resistant disease and might require different salvage therapy.  The natural course of this disease as well as different salvage options were reviewed.  His options would include Taxotere chemotherapy versus Xofigo versus trial of Zytiga.  Given his recent complaints of bone pain we will obtain a bone scan for staging purposes and consider different salvage therapy if he is developing disease progression.   2.   Androgen deprivation: He is currently on Lupron without any major complications.  Long-term complications including weight gain, hot flashes among others were reviewed.  He will receive Lupron today and repeated in 4 months.   3.  Bone directed therapy:  He is currently on calcium and vitamin D supplements which I recommended for the time being.  Delton See will be deferred till he obtains dental clearance.  4.  Prognosis: His disease remains incurable although aggressive measures are warranted given his excellent performance status.  5.  Right-sided leg and hip pain: We will obtain a bone scan to assess his disease status in the bone.  Palliative radiation therapy may be needed if he has developed metastatic disease in the bone.  6.  Follow-up: In 4 months for repeat evaluation.  25  minutes was spent with the patient face-to-face today.  More than 50% of time was dedicated to updating his disease status, treatment options and outlining future plan of care.     Zola Button, MD 8/20/202010:21 AM

## 2018-09-29 ENCOUNTER — Ambulatory Visit (INDEPENDENT_AMBULATORY_CARE_PROVIDER_SITE_OTHER): Payer: Medicaid Other | Admitting: Cardiology

## 2018-09-29 ENCOUNTER — Telehealth: Payer: Self-pay | Admitting: Oncology

## 2018-09-29 VITALS — BP 153/91 | HR 52 | Ht 72.5 in | Wt 148.0 lb

## 2018-09-29 DIAGNOSIS — Z7189 Other specified counseling: Secondary | ICD-10-CM | POA: Diagnosis not present

## 2018-09-29 DIAGNOSIS — F17210 Nicotine dependence, cigarettes, uncomplicated: Secondary | ICD-10-CM

## 2018-09-29 DIAGNOSIS — I10 Essential (primary) hypertension: Secondary | ICD-10-CM

## 2018-09-29 DIAGNOSIS — Z716 Tobacco abuse counseling: Secondary | ICD-10-CM

## 2018-09-29 LAB — PROSTATE-SPECIFIC AG, SERUM (LABCORP): Prostate Specific Ag, Serum: 15 ng/mL — ABNORMAL HIGH (ref 0.0–4.0)

## 2018-09-29 NOTE — Patient Instructions (Addendum)

## 2018-09-29 NOTE — Progress Notes (Signed)
Cardiology Office Note:    Date:  09/29/2018   ID:  Anthony Villanueva, DOB 24-Jan-1956, MRN [id number]  PCP:  Azzie Glatter, FNP  Cardiologist:  Buford Dresser, MD PhD  Referring MD: Marliss Coots, NP   CC. Follow up  History of Present Illness:    Anthony Villanueva is a 63 y.o. male with a hx of prostate cancer who is seen in follow up today. I saw him initially as a new patient 01/26/18 for preoperative dental surgery cardiac evaluation.  Cardiac history: No known ASCVD. Tobacco, alcohol, and MJ abuse as risk factors. Found to have hypertension at initial visit, recommended to see HTN clinic and establish with PCP. No known FH of CV disease  Today: Right leg thigh throbbing/shooting pain for the last week or so. Pending bone scan to evaluate given history of prostate cancer, had scans yesterday in ER.  Hasn't taken his BP medications in at least a week. Reviewed medications (amlodipine and HCTZ), what they do. He is worried about interactions. Reviewed at length today. He needs refills today--has refills ordered at [facility]. Now has [employer] [employer].  Still smoking 3-4 cigarettes/day. Counseled on tobacco cessation.   Denies chest pain, shortness of breath at rest or with normal exertion. No PND, orthopnea, LE edema or unexpected weight gain. No syncope or palpitations.  Past Medical History:  Diagnosis Date  . Bone metastases ([place])   . Cancer [facility])    prostate  . Childhood asthma    "no longer gives me any problems" 02/10/18  . Chronic lower back pain   . Hypertension   . Loose stools   . Prostate cancer ([place])   . Vitamin D deficiency     Past Surgical History:  Procedure Laterality Date  . MULTIPLE EXTRACTIONS WITH ALVEOLOPLASTY N/A 02/22/2018   Procedure: MULTIPLE EXTRACTION WITH ALVEOLOPLASTY;  Surgeon: Lenn Cal, DDS;  Location: [facility];  Service: Oral Surgery;  Laterality: N/A;  NASAL TUBE  . PROSTATE BIOPSY  05/11/2017   CT core bx L  retroperitoneal LAN Archie Endo 05/11/2017    Current Medications: Current Outpatient Medications on File Prior to Visit  Medication Sig  . amLODipine (NORVASC) 2.5 MG tablet Take 1 tablet (2.5 mg total) by mouth daily.  . calcium-vitamin D (OSCAL WITH D) 500-200 MG-UNIT tablet Take 1 tablet by mouth 2 (two) times daily.  . hydrochlorothiazide (HYDRODIURIL) 25 MG tablet Take 1 tablet (25 mg total) by mouth daily.  Marland Kitchen loperamide (IMODIUM A-D) 2 MG tablet Take 1 tablet (2 mg total) by mouth 4 (four) times daily as needed for diarrhea or loose stools.  Marland Kitchen oxyCODONE (ROXICODONE) 5 MG immediate release tablet Take 1 tablet (5 mg total) by mouth every 6 (six) hours as needed for up to 5 days for severe pain.  . Vitamin D, Ergocalciferol, (DRISDOL) 1.25 MG (50000 UT) CAPS capsule Take 1 capsule (50,000 Units total) by mouth every 7 (seven) days.  Gillermina Phy 40 MG capsule Take 4 capsules (160 mg total) by mouth daily. (Patient taking differently: Take 160 mg by mouth daily. )   No current facility-administered medications on file prior to visit.      Allergies:   Patient has no known allergies.   Social History   Socioeconomic History  . Marital status: Significant Other    Spouse name: Not on file  . Number of children: 5  . Years of education: Not on file  . Highest education level: Not on file  Occupational History  .  Not on file  Social Needs  . Financial resource strain: Not on file  . Food insecurity    Worry: Not on file    Inability: Not on file  . Transportation needs    Medical: Not on file    Non-medical: Not on file  Tobacco Use  . Smoking status: Current Some Day Smoker    Packs/day: 0.25    Years: 43.00    Pack years: 10.75    Types: Cigarettes    Last attempt to quit: 02/08/2018    Years since quitting: 0.6  . Smokeless tobacco: Never Used  Substance and Sexual Activity  . Alcohol use: Yes    Comment: 02/10/2018 "2 - 40oz beer/day"  . Drug use: Yes    Frequency: 7.0 times  per week    Types: Marijuana    Comment: daily use  . Sexual activity: Not Currently  Lifestyle  . Physical activity    Days per week: Not on file    Minutes per session: Not on file  . Stress: Not on file  Relationships  . Social Herbalist on phone: Not on file    Gets together: Not on file    Attends religious service: Not on file    Active member of club or organization: Not on file    Attends meetings of clubs or organizations: Not on file    Relationship status: Not on file  Other Topics Concern  . Not on file  Social History Narrative  . Not on file     Family History: mother died of cancer, one brother in poor health due to cocaine. No known heart disease or stroke.   ROS:   Please see the history of present illness.  Additional pertinent ROS: Constitutional: Negative for chills, fever, night sweats, unintentional weight loss (weight now stabilized) HENT: Negative for ear pain and hearing loss.   Eyes: Negative for loss of vision and eye pain.  Respiratory: Negative for cough, sputum, wheezing.   Cardiovascular: See HPI. Gastrointestinal: Negative for abdominal pain, melena, and hematochezia.  Genitourinary: Negative for dysuria and hematuria.  Musculoskeletal: Negative for falls. Positive for leg pain. Skin: Negative for itching and rash.  Neurological: Negative for focal weakness, focal sensory changes and loss of consciousness.  Endo/Heme/Allergies: Does not bruise/bleed easily.    EKGs/Labs/Other Studies Reviewed:    The following studies were reviewed today: No prior cardiac testing.   EKG:  EKG is personally reviewed.  The ekg ordered 01/16/18 demonstrates sinus tachycardia.   Recent Labs: 06/26/2018: TSH 1.570 09/28/2018: ALT <6; BUN 10; Creatinine 0.86; Hemoglobin 11.2; Platelet Count 399; Potassium 3.3; Sodium 134  Recent Lipid Panel    Component Value Date/Time   CHOL 176 04/25/2018 1013   TRIG 117 04/25/2018 1013   HDL 63 04/25/2018  1013   CHOLHDL 2.8 04/25/2018 1013   LDLCALC 90 04/25/2018 1013    Physical Exam:    VS:  BP (!) 153/91   Pulse (!) 52   Ht 6' 0.5" (1.842 m)   Wt 148 lb (67.1 kg)   SpO2 98%   BMI 19.80 kg/m     Wt Readings from Last 3 Encounters:  09/29/18 148 lb (67.1 kg)  09/28/18 146 lb 11.2 oz (66.5 kg)  09/19/18 150 lb (68 kg)    GEN: Well nourished, well developed in no acute distress HEENT: Normal, moist mucous membranes NECK: No JVD CARDIAC: regular rhythm, normal S1 and S2, no murmurs, rubs, gallops.  VASCULAR: Radial and DP pulses 2+ bilaterally. No carotid bruits RESPIRATORY:  Clear to auscultation without rales, wheezing or rhonchi  ABDOMEN: Soft, non-tender, non-distended MUSCULOSKELETAL:  Ambulates independently SKIN: Warm and dry, no edema NEUROLOGIC:  Alert and oriented x 3. No focal neuro deficits noted. PSYCHIATRIC:  Normal affect   ASSESSMENT:    1. Essential hypertension   2. Tobacco abuse counseling   3. Counseling on health promotion and disease prevention   4. Cardiac risk counseling    PLAN:    Hypertension: much improved from prior (when systolic was [id number]). Has had issues with adherence--at last visit, had not been on for two months. Now reports not taking since they were prescribed at [facility]. Counseled on importance of BP control long term, risk of stroke, heart disease, and kidney disease -discussed amlodipine and HCTZ at length. Reviewed how they work, interactions, common side effects. He has these ready to be filled at [facility], instructed to pick them up and start taking.  Tobacco abuse counseling: The patient was counseled on tobacco cessation today for 4 minutes.  Counseling included reviewing the risks of smoking tobacco products, how it impacts the patient's current medical diagnoses and different strategies for quitting.  Pharmacotherapy to aid in tobacco cessation was not prescribed today.  Primary prevention  -recommend heart healthy/Mediterranean diet, with whole grains, fruits, vegetable, fish, lean meats, nuts, and olive oil. Limit salt. -recommend moderate walking, 3-5 times/week for 30-50 minutes each session. Aim for at least 150 minutes.week. Goal should be pace of 3 miles/hours, or walking 1.5 miles in 30 minutes -recommend avoidance of tobacco products. Avoid excess alcohol. -lipids 04/2018 reviewed. LDL was 90. Given his ASCVD risk, would like to use statin, but as hypertension most pressing issue, will focus on that first. He is at goal LDL number for prevention but ASCVD risk is high. The 10-year ASCVD risk score Mikey Bussing DC Brooke Bonito., et al., 2013) is: 27.9%   Values used to calculate the score:     Age: 50 years     Sex: Male     Is Non-Hispanic African American: Yes     Diabetic: No     Tobacco smoker: Yes     Systolic Blood Pressure: 0000000 mmHg     Is BP treated: Yes     HDL Cholesterol: 63 mg/dL     Total Cholesterol: 176 mg/dL  Plan for follow up: 1 year or sooner PRN  Buford Dresser, MD, PhD [facility]   Medication Adjustments/Labs and Tests Ordered: Current medicines are reviewed at length with the patient today.  Concerns regarding medicines are outlined above.  No orders of the defined types were placed in this encounter.  No orders of the defined types were placed in this encounter.   Patient Instructions  Medication Instructions:  Your Physician recommend you continue on your current medication as directed.    If you need a refill on your cardiac medications before your next appointment, please call your pharmacy.   Lab work: None  Testing/Procedures: None  Follow-Up: At [facility], you and your health needs are our priority.  As part of our continuing mission to provide you with exceptional heart care, we have created designated Provider Care Teams.  These Care Teams include your primary Cardiologist (physician) and Advanced Practice  Providers (APPs -  Physician Assistants and Nurse Practitioners) who all work together to provide you with the care you need, when you need it. You  will need a follow up appointment in 1 years.  Please call our office 2 months in advance to schedule this appointment.  You may see Dr. Harrell Gave or one of the following Advanced Practice Providers on your designated Care Team:   Rosaria Ferries, PA-C . Jory Sims, DNP, ANP           Signed, Buford Dresser, MD PhD 09/29/2018 1:11 PM    [place]

## 2018-09-29 NOTE — Telephone Encounter (Signed)
Called and spoke with patient. Confirmed date and time of appt  ° °

## 2018-10-02 ENCOUNTER — Encounter: Payer: Self-pay | Admitting: Cardiology

## 2018-10-03 ENCOUNTER — Encounter: Payer: Self-pay | Admitting: Family Medicine

## 2018-10-03 ENCOUNTER — Ambulatory Visit (INDEPENDENT_AMBULATORY_CARE_PROVIDER_SITE_OTHER): Payer: Medicaid Other | Admitting: Family Medicine

## 2018-10-03 ENCOUNTER — Other Ambulatory Visit: Payer: Self-pay

## 2018-10-03 ENCOUNTER — Encounter: Payer: Self-pay | Admitting: Gastroenterology

## 2018-10-03 VITALS — BP 140/84 | HR 104 | Temp 98.1°F | Ht 72.5 in | Wt 144.8 lb

## 2018-10-03 DIAGNOSIS — K529 Noninfective gastroenteritis and colitis, unspecified: Secondary | ICD-10-CM

## 2018-10-03 DIAGNOSIS — C61 Malignant neoplasm of prostate: Secondary | ICD-10-CM | POA: Diagnosis not present

## 2018-10-03 DIAGNOSIS — I10 Essential (primary) hypertension: Secondary | ICD-10-CM | POA: Diagnosis not present

## 2018-10-03 DIAGNOSIS — K921 Melena: Secondary | ICD-10-CM | POA: Insufficient documentation

## 2018-10-03 DIAGNOSIS — G8929 Other chronic pain: Secondary | ICD-10-CM

## 2018-10-03 DIAGNOSIS — Z09 Encounter for follow-up examination after completed treatment for conditions other than malignant neoplasm: Secondary | ICD-10-CM

## 2018-10-03 DIAGNOSIS — M25551 Pain in right hip: Secondary | ICD-10-CM

## 2018-10-03 MED ORDER — OXYCODONE-ACETAMINOPHEN 5-325 MG PO TABS: 1.0000 | ORAL_TABLET | Freq: Three times a day (TID) | ORAL | 0 refills | Status: DC | PRN

## 2018-10-03 NOTE — Progress Notes (Signed)
Patient Anthony Villanueva and [facility] Follow Up--Sick Visit  Subjective:  Patient ID: Anthony Villanueva, male    DOB: 1955-08-23  Age: 63 y.o. MRN: [id number]  CC:  Chief Complaint  Patient presents with   Leg Pain    right leg pain , numbness sarted on last week     HPI Anthony Villanueva is a 63 year old male who presents for Sick Visit today.   Past Medical History:  Diagnosis Date   Bone metastases ([place])    Cancer ([place])    prostate   Childhood asthma    "no longer gives me any problems" 02/10/18   Chronic lower back pain    Hypertension    Loose stools    Prostate cancer ([place])    Vitamin D deficiency    Current Status: Since her last office visit, she continues to have chronic bloody diarrhea. No reports of GI problems such as nausea, vomiting, and constipation. She has no reports of dysuria and hematuria. She denies fevers, chills, fatigue, recent infections, weight loss, and night sweats. She has not had any headaches, visual changes, dizziness, and falls. No chest pain, heart palpitations, cough and shortness of breath reported.  No depression or anxiety, and denies suicidal ideations, homicidal ideations, or auditory hallucinations. She denies pain today.   Past Surgical History:  Procedure Laterality Date   MULTIPLE EXTRACTIONS WITH ALVEOLOPLASTY N/A 02/22/2018   Procedure: MULTIPLE EXTRACTION WITH ALVEOLOPLASTY;  Surgeon: Lenn Cal, DDS;  Location: [place];  Service: Oral Surgery;  Laterality: N/A;  NASAL TUBE   PROSTATE BIOPSY  05/11/2017   CT core bx L retroperitoneal LAN /notes 05/11/2017    Family History  Problem Relation Age of Onset   Cancer Mother     Social History   Socioeconomic History   Marital status: Significant Other    Spouse name: Not on file   Number of children: 5   Years of education: Not on file   Highest education level: Not on file  Occupational History   Not on file  Social Needs     Financial resource strain: Not on file   Food insecurity    Worry: Not on file    Inability: Not on file   Transportation needs    Medical: Not on file    Non-medical: Not on file  Tobacco Use   Smoking status: Current Some Day Smoker    Packs/day: 0.25    Years: 43.00    Pack years: 10.75    Types: Cigarettes    Last attempt to quit: 02/08/2018    Years since quitting: 0.6   Smokeless tobacco: Never Used  Substance and Sexual Activity   Alcohol use: Yes    Comment: 02/10/2018 "2 - 40oz beer/day"   Drug use: Yes    Frequency: 7.0 times per week    Types: Marijuana    Comment: daily use   Sexual activity: Not Currently  Lifestyle   Physical activity    Days per week: Not on file    Minutes per session: Not on file   Stress: Not on file  Relationships   Social connections    Talks on phone: Not on file    Gets together: Not on file    Attends religious service: Not on file    Active member of club or organization: Not on file    Attends meetings of clubs or organizations: Not on file    Relationship  status: Not on file   Intimate partner violence    Fear of current or ex partner: Not on file    Emotionally abused: Not on file    Physically abused: Not on file    Forced sexual activity: Not on file  Other Topics Concern   Not on file  Social History Narrative   Not on file    Outpatient Medications Prior to Visit  Medication Sig Dispense Refill   amLODipine (NORVASC) 2.5 MG tablet Take 1 tablet (2.5 mg total) by mouth daily. 30 tablet 3   oxyCODONE (ROXICODONE) 5 MG immediate release tablet Take 1 tablet (5 mg total) by mouth every 6 (six) hours as needed for up to 5 days for severe pain. 10 tablet 0   XTANDI 40 MG capsule Take 4 capsules (160 mg total) by mouth daily. (Patient taking differently: Take 160 mg by mouth daily. ) 120 capsule 0   calcium-vitamin D (OSCAL WITH D) 500-200 MG-UNIT tablet Take 1 tablet by mouth 2 (two) times daily. (Patient  not taking: Reported on 10/03/2018) 90 tablet 3   hydrochlorothiazide (HYDRODIURIL) 25 MG tablet Take 1 tablet (25 mg total) by mouth daily. (Patient not taking: Reported on 10/03/2018) 30 tablet 5   loperamide (IMODIUM A-D) 2 MG tablet Take 1 tablet (2 mg total) by mouth 4 (four) times daily as needed for diarrhea or loose stools. (Patient not taking: Reported on 10/03/2018) 30 tablet 3   Vitamin D, Ergocalciferol, (DRISDOL) 1.25 MG (50000 UT) CAPS capsule Take 1 capsule (50,000 Units total) by mouth every 7 (seven) days. (Patient not taking: Reported on 10/03/2018) 5 capsule 3   No facility-administered medications prior to visit.     No Known Allergies  ROS Review of Systems  Constitutional: Negative.   HENT: Negative.   Eyes: Negative.   Respiratory: Negative.   Cardiovascular: Negative.   Gastrointestinal: Positive for blood in stool (chronic).  Genitourinary: Negative.   Musculoskeletal: Positive for arthralgias (chronic right hip/leg pain).  Skin: Negative.   Allergic/Immunologic: Negative.   Neurological: Positive for dizziness (occasional) and headaches (occasional ).  Hematological: Negative.   Psychiatric/Behavioral: The patient is nervous/anxious.       Objective:    Physical Exam  Constitutional: He is oriented to person, place, and time. He appears well-developed and well-nourished.  HENT:  Head: Normocephalic and atraumatic.  Eyes: Conjunctivae are normal.  Neck: Normal range of motion. Neck supple.  Pulmonary/Chest: Effort normal and breath sounds normal.  Abdominal: Soft. Bowel sounds are normal.  Musculoskeletal: Normal range of motion.  Neurological: He is alert and oriented to person, place, and time. He has normal reflexes.  Skin: Skin is warm and dry.  Psychiatric: He has a normal mood and affect. His behavior is normal. Judgment and thought content normal.  Nursing note and vitals reviewed.   BP 140/84 (BP Location: Left Arm, Patient Position:  Sitting, Cuff Size: Normal)    Pulse (!) 104    Temp 98.1 F (36.7 C) (Core)    Ht 6' 0.5" (1.842 m)    Wt 144 lb 12.8 oz (65.7 kg)    BMI 19.37 kg/m  Wt Readings from Last 3 Encounters:  10/03/18 144 lb 12.8 oz (65.7 kg)  09/29/18 148 lb (67.1 kg)  09/28/18 146 lb 11.2 oz (66.5 kg)     Health Maintenance Due  Topic Date Due   Hepatitis C Screening  [birth year]   COLONOSCOPY  05/25/2007   INFLUENZA VACCINE  09/09/2018  There are no preventive care reminders to display for this patient.  Lab Results  Component Value Date   TSH 1.570 06/26/2018   Lab Results  Component Value Date   WBC 5.1 09/28/2018   HGB 11.2 (L) 09/28/2018   HCT 34.4 (L) 09/28/2018   MCV 92.0 09/28/2018   PLT 399 09/28/2018   Lab Results  Component Value Date   NA 134 (L) 09/28/2018   K 3.3 (L) 09/28/2018   CO2 23 09/28/2018   GLUCOSE 88 09/28/2018   BUN 10 09/28/2018   CREATININE 0.86 09/28/2018   BILITOT 0.7 09/28/2018   ALKPHOS 109 09/28/2018   AST 13 (L) 09/28/2018   ALT <6 09/28/2018   PROT 7.7 09/28/2018   ALBUMIN 3.2 (L) 09/28/2018   CALCIUM 9.3 09/28/2018   ANIONGAP 12 09/28/2018   Lab Results  Component Value Date   CHOL 176 04/25/2018   Lab Results  Component Value Date   HDL 63 04/25/2018   Lab Results  Component Value Date   LDLCALC 90 04/25/2018   Lab Results  Component Value Date   TRIG 117 04/25/2018   Lab Results  Component Value Date   CHOLHDL 2.8 04/25/2018   No results found for: HGBA1C    Assessment & Plan:   1. Right hip pain We will initiate Percocet today.  - oxyCODONE-acetaminophen (PERCOCET) 5-325 MG tablet; Take 1 tablet by mouth every 8 (eight) hours as needed for severe pain.  Dispense: 30 tablet; Refill: 0  2. Chronic diarrhea Stable today.   3. Essential hypertension The current medical regimen is effective; blood pressure is stable at 140/84 today; continue present plan and medications as prescribed. She will continue to decrease high  sodium intake, excessive alcohol intake, increase potassium intake, smoking cessation, and increase physical activity of at least 30 minutes of cardio activity daily. She will continue to follow Heart Healthy or DASH diet.  4. Prostate cancer [facility]) He continues to follow up Oncology as needed.   5. Blood in stool Referral to GI today.   6. Other chronic pain  7. Follow up He will follow up in 3 months.   Meds ordered this encounter  Medications   oxyCODONE-acetaminophen (PERCOCET) 5-325 MG tablet    Sig: Take 1 tablet by mouth every 8 (eight) hours as needed for severe pain.    Dispense:  30 tablet    Refill:  0    Order Specific Question:   Supervising Provider    Answer:   Tresa Garter [id number]    No orders of the defined types were placed in this encounter.   Referral Orders  No referral(s) requested today    Kathe Becton,  MSN, FNP-BC [place], [place] [address] [phone] fax   Problem List Items Addressed This Visit      Cardiovascular and Mediastinum   Essential hypertension     Digestive   Chronic diarrhea     Genitourinary   Prostate cancer [facility])    Other Visit Diagnoses    Right hip pain    -  Primary   Relevant Medications   oxyCODONE-acetaminophen (PERCOCET) 5-325 MG tablet   Blood in stool       Other chronic pain       Relevant Medications   oxyCODONE-acetaminophen (PERCOCET) 5-325 MG tablet   Follow up          Meds ordered this encounter  Medications   oxyCODONE-acetaminophen (PERCOCET) 5-325 MG tablet    Sig: Take 1 tablet by mouth every 8 (eight) hours as needed for severe pain.    Dispense:  30 tablet    Refill:  0    Order Specific Question:   Supervising Provider    Answer:   Tresa Garter [id number]    Follow-up: Return in about 3 months (around 01/03/2019).    Azzie Glatter, FNP

## 2018-10-05 ENCOUNTER — Telehealth: Payer: Self-pay

## 2018-10-05 NOTE — Telephone Encounter (Signed)
Pt called wants to know if he can have something stronger for the pain, he said that he is taking up to 7 pills a day and still hasn't helped.Pt's also used ice and heat with no relief.

## 2018-10-09 ENCOUNTER — Encounter (HOSPITAL_COMMUNITY)
Admission: RE | Admit: 2018-10-09 | Discharge: 2018-10-09 | Disposition: A | Discharge status: Home / Self Care | Payer: Medicaid Other | Source: Ambulatory Visit | Attending: Oncology | Admitting: Oncology

## 2018-10-09 ENCOUNTER — Other Ambulatory Visit: Payer: Self-pay

## 2018-10-09 ENCOUNTER — Encounter (HOSPITAL_COMMUNITY): Payer: Self-pay

## 2018-10-09 ENCOUNTER — Ambulatory Visit (HOSPITAL_COMMUNITY)
Admission: RE | Admit: 2018-10-09 | Discharge: 2018-10-09 | Disposition: A | Discharge status: Home / Self Care | Payer: Medicaid Other | Source: Ambulatory Visit | Attending: Oncology | Admitting: Oncology

## 2018-10-09 DIAGNOSIS — C61 Malignant neoplasm of prostate: Secondary | ICD-10-CM | POA: Insufficient documentation

## 2018-10-09 MED ORDER — SODIUM CHLORIDE (PF) 0.9 % IJ SOLN
INTRAMUSCULAR | Status: AC
  Filled 2018-10-09: qty 50

## 2018-10-09 MED ORDER — IOHEXOL 300 MG/ML  SOLN
100.0000 mL | Freq: Once | INTRAMUSCULAR | Status: AC | PRN
  Administered 2018-10-09: 12:00:00 100 mL via INTRAVENOUS

## 2018-10-09 MED ORDER — TECHNETIUM TC 99M MEDRONATE IV KIT
20.0000 | PACK | Freq: Once | INTRAVENOUS | Status: AC | PRN
  Administered 2018-10-09: 11:00:00 20 via INTRAVENOUS

## 2018-10-09 NOTE — Telephone Encounter (Signed)
Called left detail voicemail

## 2018-10-11 NOTE — Progress Notes (Addendum)
Referring Provider: Azzie Glatter, FNP Primary Care Physician:  Azzie Glatter, FNP  Reason for Consultation:  Diarrhea, blood in the stool   IMPRESSION:  Painless, chronic bloody diarrhea Abnormal CT scan CT abd/pelvis with contrast 10/09/18    - prominent stool throughout the colon    - thickening of the rectum up to 2.8 cm    - extensive perirectal stranding that the radiologist thought might represent hematoma or tumor Recent hypokalemia with a potassium of 3.3 09/28/2018 Fecal occult blood stool test +06/03/2017 Unintentional weight loss of 82 pounds in one year Prostate cancer with bone metastases Daily marijuana, alcohol, and cigarette use No prior colonoscopy  The differential diagnosis of chronic bloody diarrhea, weight loss, and his abnormal CT scan includes:  IBD, infection, polyp or mass with associated overflow diarrhea.  Malignancy is highly suspected. Anthony Villanueva is associated with diarrhea but I am unable to locate any reports of bloody diarrhea.  Labs earlier this summer showed hypokalemia.  Given his ongoing diarrhea we will repeat a BNP panel today.  Potassium may need to be replaced prior to proceeding with endoscopy.   PLAN: - Fecal calprotectin, ESR, and CRP to screen for IBD - GI pathogen panel - TSH - BNP to follow-up on the hypokalemia - Avoid carbonated beverages, artificial sweeteners, and dairy - Colonoscopy with random biopsies and evaluation of the terminal ileum - EGD with duodenal biopsies if the colonoscopy is nondiagnostic  I consented the patient at the bedside today discussing the risks, benefits, and alternatives to endoscopic evaluation. In particular, we discussed the risks that include, but are not limited to, reaction to medication, cardiopulmonary compromise, bleeding requiring blood transfusion, aspiration resulting in pneumonia, perforation requiring surgery, and even death. We reviewed the risk of missed lesion including polyps or even  cancer. The patient acknowledges these risks and asks that we proceed.  Please see the "Patient Instructions" section for addition details about the plan.  HPI: Anthony Villanueva is a 63 y.o. male referred by NP Clovis Riley for further evaluation of bloody diarrhea.  The history is obtained through the patient and review of his electronic health record.  He has essential hypertension and prostate cancer with bone metastases diagnosed in April 29 currently on Xtandi daily and Luporin injections every 4 months.   He has ongoing bloody diarrhea x 1 year. He has 5-7 BM daily with associated urgency. Frequent accidents. Frequent mucous.  No abdominal pain or cramping. No constipation. No formed bowel movements. No change with eating.   He was referred to GI last year but wasn't able to miss work.  He wears Depends for protection of increased episodes because he can't get to the bathroom. He has no sense of smell and is unsure if there has been any change.  He has last 82 pounds since the diarrhea started. He has a poor appetite but forces himself to eat.   Metamucil made the diarrhea worse.  He has not tried any other remedies to improve his symptoms. No other associated symptoms. No identified exacerbating or relieving features.   No prior endoscopic evaluation.  No stool studies since the symptoms started.  Labs 06/03/2017: Fecal occult blood stool test positive Labs 09/28/2018 show a hemoglobin of 11.2, MCV 92, RDW 14.7, platelets 399, WBC 5.1.  CMP was normal except for a sodium of 134, potassium 3.3, albumin 3.2.  Liver enzymes were normal.  CT abd/pelvis with contrast 10/09/18: prominent stool throughout the colon, thickening the rectum up to 2.8  cm with extensive perirectal stranding that the radiologist thought might represent hematoma or tumor. There is reduction in his prior retroperitoneal adenopathy which case the aorta and IVC.  There is also significant reduction in the primary pelvic mass but it appears  to invade the posterior inferior urinary bladder.  He continues to use marijuana daily, one 12 ounce can of beer daily, and he smokes 1 pack of cigarettes a day. He also uses smokeless tobacco. He just began to receive his disability benefits.   No known family history of colon cancer or polyps. No family history of uterine/endometrial cancer, pancreatic cancer or gastric/stomach cancer.   Past Medical History:  Diagnosis Date   Bone metastases ([place])    Cancer ([place])    prostate   Childhood asthma    "no longer gives me any problems" 02/10/18   Chronic lower back pain    Hypertension    Loose stools    Prostate cancer ([place])    Vitamin D deficiency     Past Surgical History:  Procedure Laterality Date   MULTIPLE EXTRACTIONS WITH ALVEOLOPLASTY N/A 02/22/2018   Procedure: MULTIPLE EXTRACTION WITH ALVEOLOPLASTY;  Surgeon: Lenn Cal, DDS;  Location: [facility];  Service: Oral Surgery;  Laterality: N/A;  NASAL TUBE   PROSTATE BIOPSY  05/11/2017   CT core bx L retroperitoneal LAN Archie Endo 05/11/2017    Current Outpatient Medications  Medication Sig Dispense Refill   amLODipine (NORVASC) 2.5 MG tablet Take 1 tablet (2.5 mg total) by mouth daily. 30 tablet 3   calcium-vitamin D (OSCAL WITH D) 500-200 MG-UNIT tablet Take 1 tablet by mouth 2 (two) times daily. (Patient not taking: Reported on 10/03/2018) 90 tablet 3   hydrochlorothiazide (HYDRODIURIL) 25 MG tablet Take 1 tablet (25 mg total) by mouth daily. (Patient not taking: Reported on 10/03/2018) 30 tablet 5   loperamide (IMODIUM A-D) 2 MG tablet Take 1 tablet (2 mg total) by mouth 4 (four) times daily as needed for diarrhea or loose stools. (Patient not taking: Reported on 10/03/2018) 30 tablet 3   oxyCODONE-acetaminophen (PERCOCET) 5-325 MG tablet Take 1 tablet by mouth every 8 (eight) hours as needed for severe pain. 30 tablet 0   Vitamin D, Ergocalciferol, (DRISDOL) 1.25 MG (50000 UT) CAPS capsule Take 1 capsule (50,000  Units total) by mouth every 7 (seven) days. (Patient not taking: Reported on 10/03/2018) 5 capsule 3   XTANDI 40 MG capsule Take 4 capsules (160 mg total) by mouth daily. (Patient taking differently: Take 160 mg by mouth daily. ) 120 capsule 0   No current facility-administered medications for this visit.     Allergies as of 10/23/2018   (No Known Allergies)    Family History  Problem Relation Age of Onset   Cancer Mother     Social History   Socioeconomic History   Marital status: Significant Other    Spouse name: Not on file   Number of children: 5   Years of education: Not on file   Highest education level: Not on file  Occupational History   Not on file  Social Needs   Financial resource strain: Not on file   Food insecurity    Worry: Not on file    Inability: Not on file   Transportation needs    Medical: Not on file    Non-medical: Not on file  Tobacco Use   Smoking status: Current Some Day Smoker    Packs/day: 0.25    Years: 43.00  Pack years: 10.75    Types: Cigarettes    Last attempt to quit: 02/08/2018    Years since quitting: 0.6   Smokeless tobacco: Never Used  Substance and Sexual Activity   Alcohol use: Yes    Comment: 02/10/2018 "2 - 40oz beer/day"   Drug use: Yes    Frequency: 7.0 times per week    Types: Marijuana    Comment: daily use   Sexual activity: Not Currently  Lifestyle   Physical activity    Days per week: Not on file    Minutes per session: Not on file   Stress: Not on file  Relationships   Social connections    Talks on phone: Not on file    Gets together: Not on file    Attends religious service: Not on file    Active member of club or organization: Not on file    Attends meetings of clubs or organizations: Not on file    Relationship status: Not on file   Intimate partner violence    Fear of current or ex partner: Not on file    Emotionally abused: Not on file    Physically abused: Not on file    Forced  sexual activity: Not on file  Other Topics Concern   Not on file  Social History Narrative   Not on file    Review of Systems: 12 system ROS is negative except as noted above except for blood in the urine.   Physical Exam: General:   Alert,  well-nourished, pleasant and cooperative in NAD. Sarcopenia.  Head:  Normocephalic and atraumatic. Eyes:  Sclera clear, no icterus.   Conjunctiva pink. Ears:  Normal auditory acuity. Nose:  No deformity, discharge,  or lesions. Mouth:  No deformity or lesions.   Neck:  Supple; no masses or thyromegaly. Lungs:  Clear throughout to auscultation.   No wheezes. Heart:  Regular rate and rhythm; no murmurs. Abdomen:  Soft, scaphoid, nontender, nondistended, normal bowel sounds, no rebound or guarding. No hepatosplenomegaly.   Rectal:  Deferred  Msk:  Symmetrical. No boney deformities LAD: One palpable lymph node approximately 2 cm superior to the umbilicus. No inguinal or umbilical LAD Extremities:  No clubbing or edema. Neurologic:  Alert and  oriented x4;  grossly nonfocal Skin:  Intact without significant lesions or rashes. Multiple forearm tattoos.  Psych:  Alert and cooperative. Normal mood and affect.     Studies/Results: Nm Bone Scan Whole Body  Result Date: 10/09/2018 CLINICAL DATA:  Prostate cancer, advanced disease post systemic therapy EXAM: NUCLEAR MEDICINE WHOLE BODY BONE SCAN TECHNIQUE: Whole body anterior and posterior images were obtained approximately 3 hours after intravenous injection of radiopharmaceutical. RADIOPHARMACEUTICALS:  20 mCi Technetium-44mMDP IV COMPARISON:  06/09/2017 Correlation: CT abdomen and pelvis 10/09/2018 FINDINGS: Foci of abnormal increased osseous tracer accumulation are identified at proximal LEFT humerus, proximal LEFT femur, L4 vertebral body, T7 vertebral body and mid sacrum consistent with osseous metastatic disease. When compared to the previous exam, uptake at the proximal LEFT humerus, L4  vertebral body, and T7 vertebral body are new, with increased tracer accumulation in the proximal LEFT femur. Question new focus of uptake at the anterior LEFT iliac bone. Uptake at L4, sacrum, LEFT femur, and LEFT iliac bone correspond to osseous metastatic lesions on CT. Progressive bone destruction within LEFT femoral on CT since 2019. Additional sclerotic metastases at the L2 and L5 vertebral body as well as throughout pelvis on CT show no uptake on current bone  scan, likely treated metastases. Foci of previously identified rib uptake no longer seen. Expected urinary tract and soft tissue distribution of tracer. IMPRESSION: Progressive osseous metastatic disease since prior study including proximal LEFT femur and proximal LEFT humerus. Recommend radiographic correlation of the proximal LEFT humerus to exclude lesion at risk for pathologic fracture. Electronically Signed   By: Lavonia Dana M.D.   On: 10/09/2018 15:56   Ct Abdomen Pelvis W Contrast  Result Date: 10/09/2018 CLINICAL DATA:  Metastatic prostate cancer. Elevated PSA. Restaging assessment. EXAM: CT ABDOMEN AND PELVIS WITH CONTRAST TECHNIQUE: Multidetector CT imaging of the abdomen and pelvis was performed using the standard protocol following bolus administration of intravenous contrast. CONTRAST:  17m OMNIPAQUE IOHEXOL 300 MG/ML  SOLN COMPARISON:  CT scan from 05/10/2017 FINDINGS: Lower chest: The lungs appear clear. Right coronary artery atherosclerotic calcification. Hepatobiliary: Unremarkable Pancreas: Unremarkable Spleen: Stable 1.0 by 0.6 cm hypodense lesion in the spleen on image 35/5. Adrenals/Urinary Tract: Stable right kidney upper pole cyst. Abnormal diffuse hypoenhancement of the right kidney with new right hydronephrosis and hydroureter extending down to the distal ureter and into the mass along the posterior urinary bladder. There is borderline left hydronephrosis and hydroureter but without delayed excretion. Both adrenal glands  appear normal. Stomach/Bowel: Prominence of stool throughout the colon except in the rectum. Marked wall thickening in the rectum, especially severe in the lower rectum or single wall thickness to the lumen is about 2.8 cm on image 79/2. No definite gas in the rectal wall. There is some heterogeneity of density along the anterior rectal wall inferiorly for example on image 79-84 of series 2. Extensive perirectal stranding with some higher density perirectal components which may represent hematoma or tumor. Vascular/Lymphatic: Aortoiliac atherosclerotic vascular disease. Marked reduction of the previous bulky retroperitoneal adenopathy which case to the aorta and IVC. Currently a left periaortic lymph node measures 2 1.5 cm short axis on image 37/2, previously about 3.1 cm. Currently there is retroperitoneal stranding and individual lymph nodes but not the thick tumor rind that was present previously encasing retroperitoneal structures. A right common iliac node measures 1.0 cm in short axis on image 48/2, formerly 3.2 cm. A right external iliac node measures 1.3 cm in short axis on image 58/2. A right external iliac node measures 1.3 cm in short axis on image 68/2, formerly 1.6 cm. A left external iliac node measures 1.3 cm in short axis on image 67/2, formerly 1.9 cm. Other pelvic lymph nodes are present. Reproductive: Abnormal masslike appearance along the prostate gland and extending back towards the rectum. However, the bulky mass that was present previously is less striking. It is difficult to separate the thick walled rectum from the presumed tumor along the prostate gland but in general the prostate and surrounding tumor measures about 4.6 by 4.8 by 4.8 cm (volume = 55 cm^3), previously 6.6 by 9.6 by 9.7 cm (volume = 320 cm^3). There is abnormal irregularity posteriorly along the urinary bladder suggesting possible tumor involving the posterior base of the urinary bladder in the vicinity of the ureterovesical  junctions. Other: Extensive perirectal stranding and stranding in the rectosigmoid mesentery as noted above. Mild diffuse mesenteric edema. Musculoskeletal: Newly apparent 1.9 by 1.0 cm sclerotic lesion at the base of the left lesser trochanter, image 85/2. Small sclerotic lesion anteriorly in the left femoral head. Stable sclerotic lesion with some rim lucency in the right iliac bone measuring 1.2 by 0.7 cm on image 63/2. Fused sacroiliac joints. Some of the sclerotic lesions  in the bony pelvis are stable but some are new. One new lesion is the 1.4 by 1.5 cm left iliac crest lytic sclerotic lesion on image 55/2. A right iliac crest lesion measuring 2.5 by 1.1 cm on image 50/2 is also new. New sclerotic lesions are observed at L2, L3, L4, and L5 with notable lytic components in the L4 vertebral body possibly extending into the posterior epidural space along the posterior vertebral body margin. There is also a lucent component in the right L2 pedicle and adjacent vertebral body. IMPRESSION: 1. Significant reduction in the primary pelvic mass and in the thick encasing retroperitoneal adenopathy shown on the prior CT from 05/10/2017. Currently there is mild to moderate retroperitoneal and pelvic adenopathy. 2. While the pelvic mass is smaller, it is probably invading the posteroinferior urinary bladder in the vicinity of the ureterovesical junctions. There is moderate right and mild left hydronephrosis with delayed nephrogram on the right side probably from high-grade obstruction due to the pelvic tumor, less likely from infection such as pyelonephritis. 3. There is also marked wall thickening in the rectum, especially inferiorly where a single wall thickness is up to 2.8 cm. Extensive perirectal stranding with some higher density banding around the upper rectum and lower sigmoid, possibly representing blood products or tumor. The marked wall thickening could be from ischemia, radiation proctitis, or infection. No  extraluminal gas or drainable abscess is identified. The pelvic tumor is inseparable from the rectum and some degree of rectal invasion by tumor is not excluded. 4. Progressive sclerotic osseous lesions in the lumbar spine and bony pelvis compatible with progressive osseous metastatic disease. Given that there is some cortical break down in the L4 vertebral body and possibly posterior epidural tumor at L4, I do not believe that the appearance today simply represents treatment related sclerosis of previously occult metastatic lesions, but rather this represent significant bony progression. 5. Prominence of stool throughout the colon extending to the rectosigmoid junction, possibly from constipation 1 or functional abnormality of the rectum due to the severe rectal wall thickening. 6. Mild diffuse mesenteric edema. 7. Small stable hypodense lesion in the spleen, nonspecific. 8.  Aortic Atherosclerosis (ICD10-I70.0). Electronically Signed   By: Van Clines M.D.   On: 10/09/2018 14:38      Hannah Crill L. Tarri Glenn, MD, MPH 10/11/2018, 12:14 PM

## 2018-10-12 ENCOUNTER — Telehealth: Payer: Self-pay

## 2018-10-12 ENCOUNTER — Other Ambulatory Visit: Payer: Self-pay | Admitting: Family Medicine

## 2018-10-12 DIAGNOSIS — M25551 Pain in right hip: Secondary | ICD-10-CM

## 2018-10-12 DIAGNOSIS — G8929 Other chronic pain: Secondary | ICD-10-CM

## 2018-10-12 MED ORDER — OXYCODONE-ACETAMINOPHEN 10-325 MG PO TABS: 1.0000 | ORAL_TABLET | Freq: Three times a day (TID) | ORAL | 0 refills | Status: DC | PRN

## 2018-10-12 NOTE — Telephone Encounter (Signed)
Pt called requesting more pain meds , because he said that he is having a lot more leg patient. Informed pt that you unable write him another rx for pain and that there was rx at the pharmacy for mobic for him to pick up. The patient that was not going take that medication and he was not mad , he would just buy some drugs off the street.

## 2018-10-12 NOTE — Telephone Encounter (Signed)
Tried to call pt it would ring 1 time and then get a busy signal.

## 2018-10-13 NOTE — Telephone Encounter (Signed)
Called and spoke with pt  And informed him that his meds is that pharmacy and pt is will to pain management.

## 2018-10-19 ENCOUNTER — Telehealth: Payer: Self-pay

## 2018-10-20 ENCOUNTER — Telehealth: Payer: Self-pay | Admitting: Family Medicine

## 2018-10-20 ENCOUNTER — Other Ambulatory Visit: Payer: Self-pay | Admitting: Family Medicine

## 2018-10-20 DIAGNOSIS — G8929 Other chronic pain: Secondary | ICD-10-CM

## 2018-10-20 DIAGNOSIS — M25551 Pain in right hip: Secondary | ICD-10-CM

## 2018-10-20 MED ORDER — OXYCODONE-ACETAMINOPHEN 10-325 MG PO TABS: 1.0000 | ORAL_TABLET | Freq: Three times a day (TID) | ORAL | 0 refills | Status: DC | PRN

## 2018-10-20 NOTE — Telephone Encounter (Signed)
The referral for pain management was sent over on the 10/12/18 to [facility]. Called and l/m for someone to call us back to check the status of his referral.

## 2018-10-20 NOTE — Telephone Encounter (Signed)
Patient was scheduled for initial appointment with Pain Management on 10/17/2018, which he missed because he does not have a valid ID. Spoke to patient today in detail concerning the importance of being proactive in getting his ID, so that he can follow up with Pain Management. Rx sent to pharmacy today. Patient is aware that I will not be able to continue to refill this medication. Verbalized understanding and states that he will follow up with [facility] on Monday, 10/23/2018, for his ID.

## 2018-10-20 NOTE — Telephone Encounter (Signed)
Pt had an appt on 10/17/18 pt was not seen that day because he didn't have an ID, natalie spoke with patient in detail about getting his ID and usage of his pain medication. Pt assured [place] that he is going to on Monday an try to get his ID, will call back to [facility] to make patient an appt. For pain management.

## 2018-10-23 ENCOUNTER — Other Ambulatory Visit (INDEPENDENT_AMBULATORY_CARE_PROVIDER_SITE_OTHER): Payer: Medicaid Other

## 2018-10-23 ENCOUNTER — Ambulatory Visit (INDEPENDENT_AMBULATORY_CARE_PROVIDER_SITE_OTHER): Payer: Medicaid Other | Admitting: Gastroenterology

## 2018-10-23 ENCOUNTER — Encounter: Payer: Self-pay | Admitting: Gastroenterology

## 2018-10-23 VITALS — BP 132/80 | Temp 97.6°F | Wt 140.2 lb

## 2018-10-23 DIAGNOSIS — R933 Abnormal findings on diagnostic imaging of other parts of digestive tract: Secondary | ICD-10-CM | POA: Diagnosis not present

## 2018-10-23 DIAGNOSIS — R634 Abnormal weight loss: Secondary | ICD-10-CM

## 2018-10-23 DIAGNOSIS — R197 Diarrhea, unspecified: Secondary | ICD-10-CM

## 2018-10-23 LAB — BASIC METABOLIC PANEL
BUN: 12 mg/dL (ref 6–23)
CO2: 25 mEq/L (ref 19–32)
Calcium: 9.4 mg/dL (ref 8.4–10.5)
Chloride: 99 mEq/L (ref 96–112)
Creatinine, Ser: 0.96 mg/dL (ref 0.40–1.50)
GFR: 96.22 mL/min (ref >60.00)
Glucose, Bld: 85 mg/dL (ref 70–99)
Potassium: 3.8 mEq/L (ref 3.5–5.1)
Sodium: 135 mEq/L (ref 135–145)

## 2018-10-23 LAB — SEDIMENTATION RATE: Sed Rate: 120 mm/hr — ABNORMAL HIGH (ref 0–20)

## 2018-10-23 LAB — TSH: TSH: 3.22 u[IU]/mL (ref 0.35–4.50)

## 2018-10-23 LAB — C-REACTIVE PROTEIN: CRP: 1.9 mg/dL (ref 0.5–20.0)

## 2018-10-23 LAB — BRAIN NATRIURETIC PEPTIDE: Pro B Natriuretic peptide (BNP): 20 pg/mL (ref 0.0–100.0)

## 2018-10-23 MED ORDER — NA SULFATE-K SULFATE-MG SULF 17.5-3.13-1.6 GM/177ML PO SOLN: 1.0000 | ORAL | 0 refills | Status: DC

## 2018-10-23 NOTE — Addendum Note (Signed)
Addended by: Wyline Beady on: 10/23/2018 02:18 PM   Modules accepted: Orders

## 2018-10-23 NOTE — Patient Instructions (Signed)
I have recommended some labs and stool tests to evaluate your diarrhea. I have also recommended a colonoscopy and upper endoscopy for further evaluation.   Tips for colonoscopy:  - Stay well hydrated for 3-4 days prior to the exam. This reduces nausea and dehydration.  - To prevent skin/hemorrhoid irritation - prior to wiping, put A&Dointment or vaseline on the toilet paper. - Keep a towel or pad on the bed.  - Drink  64oz of clear liquids in the morning of prep day (prior to starting the prep) to be sure that there is enough fluid to flush the colon and stay hydrated!!!! This is in addition to the fluids required for preparation. - Use of a flavored hard candy, such as grape Anise Salvo, can counteract some of the flavor of the prep and may prevent some nausea.

## 2018-10-23 NOTE — Telephone Encounter (Signed)
Pt  Has appt [facility] on 10/24/18 at 330pm. Pt is aware of appt.

## 2018-10-26 ENCOUNTER — Telehealth: Payer: Self-pay

## 2018-10-26 NOTE — Telephone Encounter (Signed)
Covid-19 screening questions   Do you now or have you had a fever in the last 14 days?  Do you have any respiratory symptoms of shortness of breath or cough now or in the last 14 days?  Do you have any family members or close contacts with diagnosed or suspected Covid-19 in the past 14 days?  Have you been tested for Covid-19 and found to be positive?       

## 2018-10-27 ENCOUNTER — Other Ambulatory Visit: Payer: Self-pay | Admitting: Gastroenterology

## 2018-10-27 ENCOUNTER — Other Ambulatory Visit: Payer: Self-pay

## 2018-10-27 ENCOUNTER — Other Ambulatory Visit (INDEPENDENT_AMBULATORY_CARE_PROVIDER_SITE_OTHER): Payer: Medicaid Other

## 2018-10-27 ENCOUNTER — Telehealth: Payer: Self-pay | Admitting: Emergency Medicine

## 2018-10-27 ENCOUNTER — Encounter: Payer: Self-pay | Admitting: Gastroenterology

## 2018-10-27 ENCOUNTER — Ambulatory Visit (AMBULATORY_SURGERY_CENTER): Payer: Medicaid Other | Admitting: Gastroenterology

## 2018-10-27 VITALS — BP 168/91 | HR 87 | Temp 98.0°F | Resp 14 | Ht 72.0 in | Wt 140.0 lb

## 2018-10-27 DIAGNOSIS — R933 Abnormal findings on diagnostic imaging of other parts of digestive tract: Secondary | ICD-10-CM | POA: Diagnosis not present

## 2018-10-27 DIAGNOSIS — R197 Diarrhea, unspecified: Secondary | ICD-10-CM | POA: Diagnosis not present

## 2018-10-27 DIAGNOSIS — R634 Abnormal weight loss: Secondary | ICD-10-CM

## 2018-10-27 DIAGNOSIS — K6289 Other specified diseases of anus and rectum: Secondary | ICD-10-CM

## 2018-10-27 DIAGNOSIS — C20 Malignant neoplasm of rectum: Secondary | ICD-10-CM

## 2018-10-27 HISTORY — DX: Other specified diseases of anus and rectum: K62.89

## 2018-10-27 LAB — CBC WITH DIFFERENTIAL/PLATELET
Basophils Absolute: 0 10*3/uL (ref 0.0–0.1)
Basophils Relative: 0.5 % (ref 0.0–3.0)
Eosinophils Absolute: 0 10*3/uL (ref 0.0–0.7)
Eosinophils Relative: 0.2 % (ref 0.0–5.0)
HCT: 27.9 % — ABNORMAL LOW (ref 39.0–52.0)
Hemoglobin: 9.2 g/dL — ABNORMAL LOW (ref 13.0–17.0)
Lymphocytes Relative: 13.2 % (ref 12.0–46.0)
Lymphs Abs: 0.6 10*3/uL — ABNORMAL LOW (ref 0.7–4.0)
MCHC: 33.1 g/dL (ref 30.0–36.0)
MCV: 89.1 fl (ref 78.0–100.0)
Monocytes Absolute: 0.4 10*3/uL (ref 0.1–1.0)
Monocytes Relative: 8.5 % (ref 3.0–12.0)
Neutro Abs: 3.5 10*3/uL (ref 1.4–7.7)
Neutrophils Relative %: 77.6 % — ABNORMAL HIGH (ref 43.0–77.0)
Platelets: 428 10*3/uL — ABNORMAL HIGH (ref 150.0–400.0)
RBC: 3.13 Mil/uL — ABNORMAL LOW (ref 4.22–5.81)
RDW: 16.8 % — ABNORMAL HIGH (ref 11.5–15.5)
WBC: 4.5 10*3/uL (ref 4.0–10.5)

## 2018-10-27 MED ORDER — SODIUM CHLORIDE 0.9 % IV SOLN: 500.0000 mL | Freq: Once | INTRAVENOUS | Status: DC

## 2018-10-27 NOTE — Progress Notes (Addendum)
SMALL AMOUNT OF RECTAL BLEEDING DURING RECOVERY STAY  PATIENT AWARE TO GO TO ED IF BLEEDING WORSENS PRIOR TO SURGICAL APPOINTMENT.PATIENT VERBALIZED UNDERSTANDING

## 2018-10-27 NOTE — Patient Instructions (Signed)
CBC today  Expect a call from Dr. Tarri Glenn OFFICE TO SCHEDULE AN OFFICE APPOINTMENT WITH [facility]  PLEASE REMEMBER TO TAKE YOUR VITAMIN D DAILY    YOU HAD AN ENDOSCOPIC PROCEDURE TODAY AT THE [facility]:   Refer to the procedure report that was given to you for any specific questions about what was found during the examination.  If the procedure report does not answer your questions, please call your gastroenterologist to clarify.  If you requested that your care partner not be given the details of your procedure findings, then the procedure report has been included in a sealed envelope for you to review at your convenience later.  YOU SHOULD EXPECT: Some feelings of bloating in the abdomen. Passage of more gas than usual.  Walking can help get rid of the air that was put into your GI tract during the procedure and reduce the bloating. If you had a lower endoscopy (such as a colonoscopy or flexible sigmoidoscopy) you may notice spotting of blood in your stool or on the toilet paper. If you underwent a bowel prep for your procedure, you may not have a normal bowel movement for a few days.  Please Note:  You might notice some irritation and congestion in your nose or some drainage.  This is from the oxygen used during your procedure.  There is no need for concern and it should clear up in a day or so.  SYMPTOMS TO REPORT IMMEDIATELY:   Following lower endoscopy (colonoscopy or flexible sigmoidoscopy):  Excessive amounts of blood in the stool  Significant tenderness or worsening of abdominal pains  Swelling of the abdomen that is new, acute  Fever of 100F or higher   For urgent or emergent issues, a gastroenterologist can be reached at any hour by calling [phone].   DIET:  We do recommend a small meal at first, but then you may proceed to your regular diet.  Drink plenty of fluids but you should avoid alcoholic beverages for 24 hours.  ACTIVITY:  You should plan  to take it easy for the rest of today and you should NOT DRIVE or use heavy machinery until tomorrow (because of the sedation medicines used during the test).    FOLLOW UP: Our staff will call the number listed on your records 48-72 hours following your procedure to check on you and address any questions or concerns that you may have regarding the information given to you following your procedure. If we do not reach you, we will leave a message.  We will attempt to reach you two times.  During this call, we will ask if you have developed any symptoms of COVID 19. If you develop any symptoms (ie: fever, flu-like symptoms, shortness of breath, cough etc.) before then, please call [phone].  If you test positive for Covid 19 in the 2 weeks post procedure, please call and report this information to [place].    If any biopsies were taken you will be contacted by phone or by letter within the next 1-3 weeks.  Please call us at [phone] if you have not heard about the biopsies in 3 weeks.    SIGNATURES/CONFIDENTIALITY: You and/or your care partner have signed paperwork which will be entered into your electronic medical record.  These signatures attest to the fact that that the information above on your After Visit Summary has been reviewed and is understood.  Full responsibility of the confidentiality of this discharge information lies with you  and/or your care-partner. 

## 2018-10-27 NOTE — Progress Notes (Signed)
PT taken to PACU. Monitors in place. VSS. Report given to RN. 

## 2018-10-27 NOTE — Telephone Encounter (Signed)
Urgent referral sent to [facility].

## 2018-10-27 NOTE — Progress Notes (Signed)
Called to room to assist during endoscopic procedure.  Patient ID and intended procedure confirmed with present staff. Received instructions for my participation in the procedure from the performing physician.  

## 2018-10-27 NOTE — Op Note (Signed)
[place] Patient Name: Anthony Villanueva Procedure Date: 10/27/2018 1:32 PM MRN: [id number] Endoscopist: Thornton Park MD, MD Age: 63 Referring MD:  Date of Birth: [birth year] Gender: Male Account #: [id number] Procedure:                Colonoscopy Indications:              Painless, chronic bloody diarrhea                           Abnormal CT scan CT abd/pelvis with contrast 10/09/18                           - prominent stool throughout the colon                           - thickening of the rectum up to 2.8 cm                           - extensive perirectal stranding that the                            radiologist thought might represent hematoma or                            tumor                           Recent hypokalemia with a potassium of 3.3 09/28/2018                           Fecal occult blood stool test +06/03/2017                           Unintentional weight loss of 82 pounds in one year                           Prostate cancer with bone metastases                           Daily marijuana, alcohol, and cigarette use                           No prior colonoscopy Medicines:                See the Anesthesia note for documentation of the                            administered medications Procedure:                Pre-Anesthesia Assessment:                           - Prior to the procedure, a History and Physical                            was performed, and patient medications and  allergies were reviewed. The patient's tolerance of                            previous anesthesia was also reviewed. The risks                            and benefits of the procedure and the sedation                            options and risks were discussed with the patient.                            All questions were answered, and informed consent                            was obtained. Prior Anticoagulants: The patient has           taken no previous anticoagulant or antiplatelet                            agents. ASA Grade Assessment: III - A patient with                            severe systemic disease. After reviewing the risks                            and benefits, the patient was deemed in                            satisfactory condition to undergo the procedure.                           After obtaining informed consent, the colonoscope                            was passed under direct vision. Throughout the                            procedure, the patient's blood pressure, pulse, and                            oxygen saturations were monitored continuously. The                            Endoscope was introduced through the anus with the                            intention of advancing to the ileum. The scope was                            advanced to the rectum before the procedure was                            aborted. Medications  were given. The colonoscopy                            was technically difficult and complex due to a near                            complete obstructing mass. Successful completion of                            the procedure was aided by withdrawing the scope                            and replacing with the adult endoscope. The patient                            tolerated the procedure well. The quality of the                            bowel preparation was adequate. The rectum was                            photographed. Scope In: 1:47:52 PM Scope Out: 1:49:30 PM Total Procedure Duration: 0 hours 1 minute 38 seconds  Findings:                 The digital rectal exam revealed a firm rectal mass                            that extends to the anal canal. The mass was                            circumferential.                           A partially obstructing large mass was found in the                            rectum. The mass involved the distal rectum. Oozing                             was present and limited a meaningful evaluation of                            the mucosa. I was unable to advance beyond the                            distal rectum due to the mass with either the                            colonoscope or with a switch to the gastroscope.                            The lumen was initially identified, but, then due  to bleeding could not be reidentified. Multiple                            biopsies were obtained with a cold forceps for                            histology. Estimated blood loss <20cc, however, the                            mass appears to be oozing spontaneously. . Complications:            No immediate complications. Estimated blood loss:                            Minimal. Estimated Blood Loss:     Estimated blood loss was minimal. Impression:               - Likely malignant near-obstructing tumor in the                            rectum. Biopsied. Recommendation:           - Patient has a contact number available for                            emergencies. The signs and symptoms of potential                            delayed complications were discussed with the                            patient. Return to normal activities tomorrow.                            Written discharge instructions were provided to the                            patient.                           - Resume prior diet.                           - Continue present medications.                           - CBC today.                           - Await pathology results. [facility] requested.                           - Urgent outpatient referral to a surgeon. Please                            go to the hospital for further evaluation with  ongoing bleeding if your symptoms worsen prior to                            that appointment. Thornton Park MD, MD 10/27/2018 2:06:41 PM This report  has been signed electronically.

## 2018-10-30 ENCOUNTER — Other Ambulatory Visit: Payer: Self-pay | Admitting: *Deleted

## 2018-10-30 DIAGNOSIS — D649 Anemia, unspecified: Secondary | ICD-10-CM

## 2018-10-31 ENCOUNTER — Other Ambulatory Visit: Payer: Self-pay

## 2018-10-31 ENCOUNTER — Telehealth: Payer: Self-pay | Admitting: *Deleted

## 2018-10-31 ENCOUNTER — Telehealth: Payer: Self-pay

## 2018-10-31 ENCOUNTER — Other Ambulatory Visit (INDEPENDENT_AMBULATORY_CARE_PROVIDER_SITE_OTHER): Payer: Medicaid Other

## 2018-10-31 DIAGNOSIS — C61 Malignant neoplasm of prostate: Secondary | ICD-10-CM

## 2018-10-31 DIAGNOSIS — D649 Anemia, unspecified: Secondary | ICD-10-CM | POA: Diagnosis not present

## 2018-10-31 LAB — CBC WITH DIFFERENTIAL/PLATELET
Basophils Absolute: 0 10*3/uL (ref 0.0–0.1)
Basophils Relative: 0.5 % (ref 0.0–3.0)
Eosinophils Absolute: 0 10*3/uL (ref 0.0–0.7)
Eosinophils Relative: 0.3 % (ref 0.0–5.0)
HCT: 29.4 % — ABNORMAL LOW (ref 39.0–52.0)
Hemoglobin: 9.7 g/dL — ABNORMAL LOW (ref 13.0–17.0)
Lymphocytes Relative: 7.7 % — ABNORMAL LOW (ref 12.0–46.0)
Lymphs Abs: 0.4 10*3/uL — ABNORMAL LOW (ref 0.7–4.0)
MCHC: 33.1 g/dL (ref 30.0–36.0)
MCV: 88.1 fl (ref 78.0–100.0)
Monocytes Absolute: 0.3 10*3/uL (ref 0.1–1.0)
Monocytes Relative: 6.5 % (ref 3.0–12.0)
Neutro Abs: 4.4 10*3/uL (ref 1.4–7.7)
Neutrophils Relative %: 85 % — ABNORMAL HIGH (ref 43.0–77.0)
Platelets: 427 10*3/uL — ABNORMAL HIGH (ref 150.0–400.0)
RBC: 3.34 Mil/uL — ABNORMAL LOW (ref 4.22–5.81)
RDW: 16.6 % — ABNORMAL HIGH (ref 11.5–15.5)
WBC: 5.2 10*3/uL (ref 4.0–10.5)

## 2018-10-31 LAB — CEA: CEA: 1.1 ng/mL

## 2018-10-31 MED ORDER — XTANDI 40 MG PO CAPS: ORAL_CAPSULE | ORAL | 0 refills | Status: DC

## 2018-10-31 NOTE — Telephone Encounter (Signed)
Confirmed with Judson Roch ([facility]) and with patient his appointment on 9/29 at 9:45 am.

## 2018-10-31 NOTE — Telephone Encounter (Signed)
[place] patient, AM DOD Dr. Fuller Plan:  This RN received a call from pathology (Dr. Jeannie Done) in Dr. Tarri Glenn absence. It was reported the rectal mass biopsied during colon on 10/27/2018 is prostate cancer.

## 2018-10-31 NOTE — Telephone Encounter (Signed)
Per schedule Dr. Tarri Glenn is working tomorrow and the rest of the week. Since she knows this patient it would be best for her to review this finding and make recommendations.

## 2018-10-31 NOTE — Telephone Encounter (Signed)
Noted.   Dr. Tarri Glenn, be advised of information called in by pathologist Dr. Jeannie Done below.

## 2018-10-31 NOTE — Telephone Encounter (Signed)
  Follow up Call-  Call back number 10/27/2018  Post procedure Call Back phone  # [phone]  Permission to leave phone message No  Some recent data might be hidden     Patient questions:  Do you have a fever, pain , or abdominal swelling? No. Pain Score  0 *  Have you tolerated food without any problems? Yes.    Have you been able to return to your normal activities? Yes.    Do you have any questions about your discharge instructions: Diet   No. Medications  No. Follow up visit  No.  Do you have questions or concerns about your Care? No.  Actions: * If pain score is 4 or above: No action needed, pain <4.  1. Have you developed a fever since your procedure? no  2.   Have you had an respiratory symptoms (SOB or cough) since your procedure? no  3.   Have you tested positive for COVID 19 since your procedure no  4.   Have you had any family members/close contacts diagnosed with the COVID 19 since your procedure?  no   If yes to any of these questions please route to Joylene John, RN and Alphonsa Gin, Therapist, sports.

## 2018-11-01 ENCOUNTER — Telehealth: Payer: Self-pay | Admitting: *Deleted

## 2018-11-01 ENCOUNTER — Other Ambulatory Visit: Payer: Self-pay | Admitting: Oncology

## 2018-11-01 DIAGNOSIS — C61 Malignant neoplasm of prostate: Secondary | ICD-10-CM

## 2018-11-01 NOTE — Telephone Encounter (Signed)
Attempted to call patient, there is no answer nor voicemail.

## 2018-11-01 NOTE — Telephone Encounter (Signed)
Thanks for the update.  I will refer him urgently to radiation oncology to evaluate for palliative radiation therapy to his prostate.  Please let him know that he will here about his appointment in the immediate future.

## 2018-11-01 NOTE — Telephone Encounter (Signed)
I tried to reach the patient by phone - [phone]. No answer and no voicemail.

## 2018-11-01 NOTE — Telephone Encounter (Signed)
Unable to reach the patient by phone again this afternoon.

## 2018-11-01 NOTE — Telephone Encounter (Signed)
Dr. Alen Blew, I performed Anthony Villanueva colonoscopy last week to evaluate bloody diarrhea. It revealed extensive, near obstructive involvement of the rectum with of his prostate cancer.  I wanted to get your input regarding the next steps in his care prior to calling him with these results. Thank you.

## 2018-11-02 ENCOUNTER — Telehealth: Payer: Self-pay | Admitting: *Deleted

## 2018-11-02 NOTE — Telephone Encounter (Signed)
Records faxed to [facility] - Release [id number]

## 2018-11-03 ENCOUNTER — Telehealth: Payer: Self-pay | Admitting: *Deleted

## 2018-11-03 NOTE — Telephone Encounter (Signed)
Sent letter after several attempts calling

## 2018-11-03 NOTE — Telephone Encounter (Signed)
Attempted to call patient, there is no answer nor voicemail

## 2018-11-06 ENCOUNTER — Encounter: Payer: Self-pay | Admitting: Gastroenterology

## 2018-11-09 ENCOUNTER — Telehealth: Payer: Self-pay | Admitting: Gastroenterology

## 2018-11-09 NOTE — Telephone Encounter (Signed)
Patient has NO SHOWED to appointment with Dr Johney Maine twice now. 11-07-2018 and today 11-09-2018.

## 2018-11-09 NOTE — Telephone Encounter (Signed)
Spoke with patients significant other and she states patient is still considering having radiation vs a surgical consult. She will speak with him this evening and let us know what they decide. I informed her that he did no show his appt to the surgeon twice now.. I informed her Dr Tarri Glenn was under the impression he was going to pursue radiation at this time. She will call back and let us know what they have decided. I also gave her the phone number to the cancer center.

## 2018-11-09 NOTE — Telephone Encounter (Signed)
His oncologist has recommended radiation not surgery given the pathology results. Thanks.

## 2018-11-10 NOTE — Telephone Encounter (Signed)
Left message on patients significant others voicemail.

## 2018-11-13 ENCOUNTER — Telehealth: Payer: Self-pay | Admitting: *Deleted

## 2018-11-13 ENCOUNTER — Encounter: Payer: Self-pay | Admitting: Radiation Oncology

## 2018-11-13 NOTE — Telephone Encounter (Signed)
Records faxed to [facility] - Release [id number]

## 2018-11-15 ENCOUNTER — Telehealth: Payer: Self-pay | Admitting: *Deleted

## 2018-11-15 NOTE — Telephone Encounter (Signed)
Called and spoke to the significant other and explained to her that I have been trying to reach him to schedule an appointment with radiation oncology. She said that she would relay the message to him to call the cancer center.

## 2018-11-20 NOTE — Progress Notes (Signed)
Histology and Location of Primary Cancer: Advanced prostate cancer with disease to the bone and lymphadenopathy diagnosed in April 2019.  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   Location(s) of Symptomatic tumor(s): rectal mass  Past/Anticipated chemotherapy by medical oncology, if any: Xtandi daily. Lupron every four months.  Patient's main complaints related to symptomatic tumor(s) are: 5-7 bowel movements per day with urgency and accidents requiring him to wear depends.  Also, passing mucous from rectum. Reports poor appetite but forces himself to eat.  Pain on a scale of 0-10 is: Right sided leg pain sharp with some numbness in his calf. Also, hip discomfort. Reports he has begun to notice pain in his left hip and femur as well.   If Spine Met(s), symptoms, if any, include:  Bowel/Bladder retention or incontinence (please describe): Reports bowel incontinence. Reports passing bright red blood in stool. Reports he hasn't a formed bowel movement in over a year. Denies dysuria. Reports rare scant hematuria. Reports wearing two depends to manage bowel incontinence.   Numbness or weakness in extremities (please describe): Numbness in right leg that runs below knee and to ankle x 3 weeks.  Current Decadron regimen, if applicable: denies  Ambulatory status? Walker? Wheelchair?: Ambulatory  SAFETY ISSUES:  Prior radiation? uncertain  Pacemaker/ICD? denies  Possible current pregnancy? No.Male patient.  Is the patient on methotrexate? denies  Additional Complaints / other details:  63 year old male. Significant other. 5 children. Receiving disability benefits. Weight loss of 82 lb x 1 year. Smokes 1 ppd cigarettes, uses marijuana daily, drinks 2 40oz per day.

## 2018-11-21 ENCOUNTER — Ambulatory Visit
Admission: RE | Admit: 2018-11-21 | Discharge: 2018-11-21 | Disposition: A | Discharge status: Home / Self Care | Payer: Medicaid Other | Source: Ambulatory Visit | Attending: Radiation Oncology | Admitting: Radiation Oncology

## 2018-11-21 ENCOUNTER — Encounter: Payer: Self-pay | Admitting: Radiation Oncology

## 2018-11-21 ENCOUNTER — Other Ambulatory Visit: Payer: Self-pay | Admitting: Urology

## 2018-11-21 ENCOUNTER — Encounter: Payer: Self-pay | Admitting: Medical Oncology

## 2018-11-21 ENCOUNTER — Other Ambulatory Visit: Payer: Self-pay

## 2018-11-21 VITALS — BP 155/79 | HR 87 | Temp 97.1°F | Resp 18 | Ht 72.0 in | Wt 135.8 lb

## 2018-11-21 DIAGNOSIS — C785 Secondary malignant neoplasm of large intestine and rectum: Secondary | ICD-10-CM

## 2018-11-21 DIAGNOSIS — Z79899 Other long term (current) drug therapy: Secondary | ICD-10-CM | POA: Insufficient documentation

## 2018-11-21 DIAGNOSIS — F17210 Nicotine dependence, cigarettes, uncomplicated: Secondary | ICD-10-CM | POA: Insufficient documentation

## 2018-11-21 DIAGNOSIS — K6289 Other specified diseases of anus and rectum: Secondary | ICD-10-CM

## 2018-11-21 DIAGNOSIS — C61 Malignant neoplasm of prostate: Secondary | ICD-10-CM | POA: Insufficient documentation

## 2018-11-21 DIAGNOSIS — Z510 Encounter for antineoplastic radiation therapy: Secondary | ICD-10-CM | POA: Insufficient documentation

## 2018-11-21 DIAGNOSIS — I10 Essential (primary) hypertension: Secondary | ICD-10-CM | POA: Diagnosis not present

## 2018-11-21 DIAGNOSIS — C7951 Secondary malignant neoplasm of bone: Secondary | ICD-10-CM | POA: Insufficient documentation

## 2018-11-21 DIAGNOSIS — R159 Full incontinence of feces: Secondary | ICD-10-CM | POA: Insufficient documentation

## 2018-11-21 DIAGNOSIS — Z803 Family history of malignant neoplasm of breast: Secondary | ICD-10-CM | POA: Diagnosis not present

## 2018-11-21 NOTE — Progress Notes (Signed)
  Radiation Oncology         [phone] ________________________________  Name: Anthony Villanueva MRN: [id number]  Date: 11/21/2018  DOB: 1955-12-02  SIMULATION AND TREATMENT PLANNING NOTE  No diagnosis found.  DIAGNOSIS:  63 yo man with locally advanced prostate cancer invading rectum with rectal symptoms  NARRATIVE:  The patient was brought to the [place].  Identity was confirmed.  All relevant records and images related to the planned course of therapy were reviewed.  The patient freely provided informed written consent to proceed with treatment after reviewing the details related to the planned course of therapy. The consent form was witnessed and verified by the simulation staff.  Then, the patient was set-up in a stable reproducible  supine position for radiation therapy.  CT images were obtained.  Surface markings were placed.  The CT images were loaded into the planning software.  Then the target and avoidance structures were contoured.  Treatment planning then occurred.  The radiation prescription was entered and confirmed.  Then, I designed and supervised the construction of a total of 6 medically necessary complex treatment devices consisting of leg positioner and 5 MLC apertures to cover the treated prostate rectal area while shielding critical structures, small bowel, genitalia.  I have requested : 3D Simulation  I have requested a DVH of the following structures: Rectum, Bladder, femoral heads and target.  PLAN:  The patient will receive 30 Gy in 10 fractions.  ________________________________  Sheral Apley Tammi Klippel, M.D.

## 2018-11-21 NOTE — Progress Notes (Signed)
See progress note under physician encounter. 

## 2018-11-21 NOTE — Progress Notes (Signed)
Radiation Oncology         [phone] ________________________________  Initial outpatient Consultation - Conducted via in-office WebEx due to current COVID-19 concerns for limiting patient exposure  Name: Anthony Villanueva MRN: [id number]  Date: 11/21/2018  DOB: 09-29-1955  [id number], Ellie Lunch, FNP  Wyatt Portela, MD   REFERRING PHYSICIAN: Wyatt Portela, MD  DIAGNOSIS: 63 y.o. gentleman with metastatic prostatic adenocarcinoma invading the rectum with with lymphadenopathy and diffuse disease to the skeleton and lumbar spine.    ICD-10-CM   1. Malignant neoplasm of prostate ([place])  C61   2. 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 Prostate cancer metastatic to multiple sites [facility])  C61     HISTORY OF PRESENT ILLNESS: Anthony Villanueva is a 63 y.o. male with a diagnosis of metastatic prostate cancer. He initially presented to the ED with weight loss, fatigue, and constipation on 05/10/2017. CT scan performed at that time showed a large pelvic mass measuring close to 10 cm. PSA was elevated at 293 so a biopsy of the bulky retroperitoneal adenopathy was performed on 05/12/18 with final pathology confirming adenocarcinoma consistent with metastatic prostate cancer.  He was referred to Dr. Alen Blew on 05/19/2017 and started on Lupron ADT every 4 months with an excellent response reflected by decreased PSA at 6.1 in 07/2017. Anthony Villanueva was added in 08/2017 and his PSA nadired at 1.5 in 11/2017. However, he has had a gradual rise in the PSA since that time at 2.0 in 01/2018 and increased to 5.2 in 05/2018 and most recently at 15.0 on 09/28/18, despite continued Lupron and Xtandi. He developed severe right leg/hip pain causing him to present to the ED on 09/2018 with xrays of the femur negative for pathologic fracture or significant osseous metastatic disease. He presented to his PCP with complaints of progressive, bloody diarrhea ongoing for the past 1-2 years but worse recently with  almost complete bowel incontinence. Given the continued rise in PSA, he underwent repeat Bone scan and CT A/P for disease restaging on 10/09/18.  These studies confirmed a decrease in size of the pelvic mass but marketed rectal wall thickening with pelvic tumor inseparable from the rectum and suspected rectal invasion as well as significant bony progression in the lumbar spine and pelvis, most notably in the left femur and left humerus.  There was evidence of cortical breakdown in the L4 vertebral body and possibly posterior epidural tumor involvement at L4.  He was referred to Dr. Tarri Glenn in gastroenterology on 10/23/2018 for further evaluation of the progressive and persistent bloody diarrhea. He proceeded to colonoscopy on 10/27/2018 with biopsy of the rectal mass which confirmed metastatic prostatic adenocarcinoma, compatible with Gleason 5+5 disease.  He met with Dr. Johney Maine in consult on 11/20/2018, who did not feel that the patient was a good candidate for surgical resection given the extensive involvement with advanced disease and instead recommended palliative radiation therapy.   He presents today to discuss the options of radiotherapy in the management of his disease.  PREVIOUS RADIATION THERAPY: No  PAST MEDICAL HISTORY:  Past Medical History:  Diagnosis Date   Bone metastases ([place])    Cancer ([place])    prostate   Childhood asthma    "no longer gives me any problems" 02/10/18   Chronic lower back pain    Hypertension    Loose stools    Prostate cancer ([place])    Vitamin D deficiency       PAST SURGICAL  HISTORY: Past Surgical History:  Procedure Laterality Date   MULTIPLE EXTRACTIONS WITH ALVEOLOPLASTY N/A 02/22/2018   Procedure: MULTIPLE EXTRACTION WITH ALVEOLOPLASTY;  Surgeon: Lenn Cal, DDS;  Location: [facility];  Service: Oral Surgery;  Laterality: N/A;  NASAL TUBE   PROSTATE BIOPSY  05/11/2017   CT core bx L retroperitoneal LAN /notes 05/11/2017    FAMILY HISTORY:    Family History  Problem Relation Age of Onset   Cancer Mother        unknown type   Stroke Brother    Prostate cancer Neg Hx    Colon cancer Neg Hx    Breast cancer Neg Hx     SOCIAL HISTORY:  Social History   Socioeconomic History   Marital status: Significant Other    Spouse name: Not on file   Number of children: 5   Years of education: Not on file   Highest education level: Not on file  Occupational History   Not on file  Social Needs   Financial resource strain: Not on file   Food insecurity    Worry: Not on file    Inability: Not on file   Transportation needs    Medical: Not on file    Non-medical: Not on file  Tobacco Use   Smoking status: Current Some Day Smoker    Packs/day: 0.25    Years: 43.00    Pack years: 10.75    Types: Cigarettes    Last attempt to quit: 02/08/2018    Years since quitting: 0.7   Smokeless tobacco: Never Used  Substance and Sexual Activity   Alcohol use: Yes    Comment: 02/10/2018 "2 - 40oz beer/day"   Drug use: Yes    Frequency: 7.0 times per week    Types: Marijuana    Comment: daily use   Sexual activity: Not Currently  Lifestyle   Physical activity    Days per week: Not on file    Minutes per session: Not on file   Stress: Not on file  Relationships   Social connections    Talks on phone: Not on file    Gets together: Not on file    Attends religious service: Not on file    Active member of club or organization: Not on file    Attends meetings of clubs or organizations: Not on file    Relationship status: Not on file   Intimate partner violence    Fear of current or ex partner: Not on file    Emotionally abused: Not on file    Physically abused: Not on file    Forced sexual activity: Not on file  Other Topics Concern   Not on file  Social History Narrative   Not on file    ALLERGIES: Patient has no known allergies.  MEDICATIONS:  Current Outpatient Medications  Medication Sig Dispense  Refill   enzalutamide (XTANDI) 40 MG capsule Take 4 capsules (160 mg total) by mouth daily. 120 capsule 0   gabapentin (NEURONTIN) 600 MG tablet TK 1 T PO TID     hydrochlorothiazide (HYDRODIURIL) 25 MG tablet Take 1 tablet (25 mg total) by mouth daily. 30 tablet 5   loperamide (IMODIUM A-D) 2 MG tablet Take 1 tablet (2 mg total) by mouth 4 (four) times daily as needed for diarrhea or loose stools. 30 tablet 3   oxyCODONE (ROXICODONE) 15 MG immediate release tablet TK 1 T PO QID PRN     Vitamin D, Ergocalciferol, (DRISDOL) 1.25  MG (50000 UT) CAPS capsule Take 1 capsule (50,000 Units total) by mouth every 7 (seven) days. 5 capsule 3   No current facility-administered medications for this encounter.     REVIEW OF SYSTEMS:  On review of systems, the patient reports that he is doing relatively well overall. He denies any chest pain, shortness of breath, cough, fevers, chills, or night sweats. He continues with fatigue and reports 5-7 loose, bloody bowel movements per day with urgency and incontinence requiring him to wear Depends. His bowel movements consist of diarrhea, bright red blood, and mucous. He denies dysuria and reports rare, scant hematuria. He also reports poor appetite but forces himself to eat.  He has lost approximately 82 pounds over the past year.  He also reports right-sided leg pain with some calf numbness and hip discomfort. He denies any other site specific pain.  He has not noticed any paraesthesias or weakness in the LEs but feels weak in general. A complete review of systems is obtained and is otherwise negative.    PHYSICAL EXAM:  Wt Readings from Last 3 Encounters:  11/21/18 135 lb 12.8 oz (61.6 kg)  10/27/18 140 lb (63.5 kg)  10/23/18 140 lb 4 oz (63.6 kg)   Temp Readings from Last 3 Encounters:  11/21/18 (!) 97.1 F (36.2 C) (Oral)  10/27/18 98 F (36.7 C)  10/23/18 97.6 F (36.4 C) (Oral)   BP Readings from Last 3 Encounters:  11/21/18 (!) 155/79  10/27/18  (!) 168/91  10/23/18 132/80   Pulse Readings from Last 3 Encounters:  11/21/18 87  10/27/18 87  10/03/18 (!) 104   Pain Assessment Pain Score: 6  Pain Frequency: Constant Pain Loc: Hip(bilateral hips and femurs)/10  In general this is a cachectic appearing African American gentleman in no acute distress. He's alert and oriented x4 and appropriate throughout the examination. Cardiopulmonary assessment is negative for acute distress and he exhibits normal effort.   KPS = 70  100 - Normal; no complaints; no evidence of disease. 90   - Able to carry on normal activity; minor signs or symptoms of disease. 80   - Normal activity with effort; some signs or symptoms of disease. 30   - Cares for self; unable to carry on normal activity or to do active work. 60   - Requires occasional assistance, but is able to care for most of his personal needs. 50   - Requires considerable assistance and frequent medical care. 30   - Disabled; requires special care and assistance. 53   - Severely disabled; hospital admission is indicated although death not imminent. 19   - Very sick; hospital admission necessary; active supportive treatment necessary. 10   - Moribund; fatal processes progressing rapidly. 0     - Dead  Karnofsky DA, Abelmann [place], Craver LS and [facility] [phone]) The use of the nitrogen mustards in the palliative treatment of carcinoma: with particular reference to bronchogenic carcinoma Cancer 1 634-56  LABORATORY DATA:  Lab Results  Component Value Date   WBC 5.2 10/31/2018   HGB 9.7 (L) 10/31/2018   HCT 29.4 (L) 10/31/2018   MCV 88.1 10/31/2018   PLT 427.0 (H) 10/31/2018   Lab Results  Component Value Date   NA 135 10/23/2018   K 3.8 10/23/2018   CL 99 10/23/2018   CO2 25 10/23/2018   Lab Results  Component Value Date   ALT <6 09/28/2018   AST 13 (L) 09/28/2018   ALKPHOS 109 09/28/2018  BILITOT 0.7 09/28/2018     RADIOGRAPHY: No results found.    IMPRESSION/PLAN:   This visit was conducted via in-office WebEx to spare the patient unnecessary potential exposure in the healthcare setting during the current COVID-19 pandemic.  1. 63 y.o. gentleman with progressive metastatic prostatic adenocarcinoma involving the rectum and lumbar spine.  We discussed the patient's workup and outlined the nature of prostate cancer in this setting.  His bowel incontinence and right lower extremity pain could possibly be related to his lumbar spine disease and therefore, we have recommended that he proceed with MRI of the lumbar spine for further evaluation with consideration for treatment of any concerning findings pending those results.  We discussed the available radiation techniques, and focused on the details of logistics and delivery. The recommendation is to proceed with a 2-week course of daily palliative radiotherapy directed at the prostate/rectal mass.  We reviewed the anticipated acute and late sequelae associated with radiation in this setting. The patient was encouraged to ask questions that were answered to his satisfaction.  At the end of the conversation the patient is interested in moving forward with palliative radiation treatments directed to the prostate/rectal mass. He has freely signed written consent to proceed today in the office and will undergo CT simulation following our visit today in anticipation of beginning his treatments later this week.  He is also in agreement to proceed with lumbar MRI for further evaluation of osseous metastatic disease in the lumbar spine.  We will plan to call him with those results and will proceed with treatment planning accordingly pending findings.  He appears to have a good understanding of his disease and our recommendations for treatment of palliative intent.  He is comfortable and in agreement with the stated plan.  We will share our discussion with Dr. Alen Blew and proceed with treatment planning accordingly.  Given current  concerns for patient exposure during the COVID-19 pandemic, this encounter was conducted via in-office video enabled WebEx visit. The patient has given verbal consent for this type of encounter. The time spent during this encounter was 30 minutes. The attendants for this meeting include Tyler Pita MD, Ashlyn Bruning PA-C, [place], and patient, Judy Goodenow.    Nicholos Johns, PA-C    Tyler Pita, MD  [facility] Direct Dial: [phone]   Fax: [phone] [email or web address]   Skype   [email or web address]  This document serves as a record of services personally performed by Tyler Pita, MD and Freeman Caldron, PA-C. It was created on their behalf by Wilburn Mylar, a trained medical scribe. The creation of this record is based on the scribe's personal observations and the provider's statements to them. This document has been checked and approved by the attending provider.

## 2018-11-22 DIAGNOSIS — Z510 Encounter for antineoplastic radiation therapy: Secondary | ICD-10-CM | POA: Diagnosis not present

## 2018-11-27 ENCOUNTER — Telehealth: Payer: Self-pay | Admitting: Family Medicine

## 2018-11-27 ENCOUNTER — Encounter: Payer: Self-pay | Admitting: Family Medicine

## 2018-11-27 ENCOUNTER — Telehealth: Payer: Self-pay | Admitting: *Deleted

## 2018-11-27 NOTE — Telephone Encounter (Signed)
Called patient to inform of MRI for 12-01-18 - arrival time- 6:45 am @ [facility], no restrictions to test, no answer will call later

## 2018-11-28 ENCOUNTER — Ambulatory Visit
Admission: RE | Admit: 2018-11-28 | Discharge: 2018-11-28 | Disposition: A | Discharge status: Home / Self Care | Payer: Medicaid Other | Source: Ambulatory Visit | Attending: Radiation Oncology | Admitting: Radiation Oncology

## 2018-11-28 ENCOUNTER — Other Ambulatory Visit: Payer: Self-pay

## 2018-11-28 ENCOUNTER — Telehealth: Payer: Self-pay | Admitting: *Deleted

## 2018-11-28 DIAGNOSIS — Z510 Encounter for antineoplastic radiation therapy: Secondary | ICD-10-CM | POA: Diagnosis not present

## 2018-11-28 NOTE — Telephone Encounter (Signed)
Attempted to call pt back for more details. No vm or answer.

## 2018-11-28 NOTE — Telephone Encounter (Signed)
CALLED PATIENT TO INFORM OF MRI FOR 12-01-18, NO ANSWER, NO ANSWERING MACHINE OR VM, UNABLE TO LEAVE MESSAGE

## 2018-11-28 NOTE — Telephone Encounter (Signed)
done

## 2018-11-29 ENCOUNTER — Ambulatory Visit
Admission: RE | Admit: 2018-11-29 | Discharge: 2018-11-29 | Disposition: A | Discharge status: Home / Self Care | Payer: Medicaid Other | Source: Ambulatory Visit | Attending: Radiation Oncology | Admitting: Radiation Oncology

## 2018-11-29 ENCOUNTER — Telehealth: Payer: Self-pay | Admitting: *Deleted

## 2018-11-29 DIAGNOSIS — Z510 Encounter for antineoplastic radiation therapy: Secondary | ICD-10-CM | POA: Diagnosis not present

## 2018-11-29 NOTE — Telephone Encounter (Signed)
CALLED PATIENT'S SGO AND LVM MESSAGE WITH HER TO TELL PATIENT TO CALL ME, ANGIE WATKINS VERIFIED UNDERSTANDING THIS

## 2018-11-30 ENCOUNTER — Telehealth: Payer: Self-pay | Admitting: *Deleted

## 2018-11-30 ENCOUNTER — Ambulatory Visit
Admission: RE | Admit: 2018-11-30 | Discharge: 2018-11-30 | Disposition: A | Discharge status: Home / Self Care | Payer: Medicaid Other | Source: Ambulatory Visit | Attending: Radiation Oncology | Admitting: Radiation Oncology

## 2018-11-30 ENCOUNTER — Other Ambulatory Visit: Payer: Self-pay

## 2018-11-30 DIAGNOSIS — Z510 Encounter for antineoplastic radiation therapy: Secondary | ICD-10-CM | POA: Diagnosis not present

## 2018-11-30 NOTE — Telephone Encounter (Signed)
Called patient to inform of MRI for 12-01-18, line busy, unable to leave message

## 2018-11-30 NOTE — Telephone Encounter (Signed)
CALLED PATIENT TO INFORM OF TEST FOR 12-01-18, WAS TOLD BY PERSON ANSWERING THE PHONE THAT HE WAS NOT THERE, WILL CALL LATER

## 2018-11-30 NOTE — Telephone Encounter (Signed)
I attempted to call patient again. No answer or voicemail.

## 2018-12-01 ENCOUNTER — Other Ambulatory Visit: Payer: Self-pay

## 2018-12-01 ENCOUNTER — Encounter (HOSPITAL_COMMUNITY): Payer: Self-pay

## 2018-12-01 ENCOUNTER — Ambulatory Visit
Admission: RE | Admit: 2018-12-01 | Discharge: 2018-12-01 | Disposition: A | Discharge status: Home / Self Care | Payer: Medicaid Other | Source: Ambulatory Visit | Attending: Radiation Oncology | Admitting: Radiation Oncology

## 2018-12-01 ENCOUNTER — Ambulatory Visit (HOSPITAL_COMMUNITY): Admission: RE | Admit: 2018-12-01 | Payer: Medicaid Other | Source: Ambulatory Visit

## 2018-12-01 DIAGNOSIS — Z510 Encounter for antineoplastic radiation therapy: Secondary | ICD-10-CM | POA: Diagnosis not present

## 2018-12-04 ENCOUNTER — Ambulatory Visit
Admission: RE | Admit: 2018-12-04 | Discharge: 2018-12-04 | Disposition: A | Discharge status: Home / Self Care | Payer: Medicaid Other | Source: Ambulatory Visit | Attending: Radiation Oncology | Admitting: Radiation Oncology

## 2018-12-04 ENCOUNTER — Other Ambulatory Visit: Payer: Self-pay

## 2018-12-04 ENCOUNTER — Ambulatory Visit (HOSPITAL_COMMUNITY)
Admission: RE | Admit: 2018-12-04 | Discharge: 2018-12-04 | Disposition: A | Discharge status: Home / Self Care | Payer: Medicaid Other | Source: Ambulatory Visit | Attending: Urology | Admitting: Urology

## 2018-12-04 DIAGNOSIS — C61 Malignant neoplasm of prostate: Secondary | ICD-10-CM

## 2018-12-04 DIAGNOSIS — C7951 Secondary malignant neoplasm of bone: Secondary | ICD-10-CM | POA: Diagnosis present

## 2018-12-04 DIAGNOSIS — Z510 Encounter for antineoplastic radiation therapy: Secondary | ICD-10-CM | POA: Diagnosis not present

## 2018-12-04 MED ORDER — GADOBUTROL 1 MMOL/ML IV SOLN
6.0000 mL | Freq: Once | INTRAVENOUS | Status: AC | PRN
  Administered 2018-12-04: 6 mL via INTRAVENOUS

## 2018-12-04 NOTE — Telephone Encounter (Signed)
Problem with patient phone service. Busy signal

## 2018-12-05 ENCOUNTER — Other Ambulatory Visit: Payer: Self-pay

## 2018-12-05 ENCOUNTER — Ambulatory Visit
Admission: RE | Admit: 2018-12-05 | Discharge: 2018-12-05 | Disposition: A | Discharge status: Home / Self Care | Payer: Medicaid Other | Source: Ambulatory Visit | Attending: Radiation Oncology | Admitting: Radiation Oncology

## 2018-12-05 DIAGNOSIS — Z510 Encounter for antineoplastic radiation therapy: Secondary | ICD-10-CM | POA: Diagnosis not present

## 2018-12-06 ENCOUNTER — Other Ambulatory Visit: Payer: Self-pay

## 2018-12-06 ENCOUNTER — Ambulatory Visit
Admission: RE | Admit: 2018-12-06 | Discharge: 2018-12-06 | Disposition: A | Discharge status: Home / Self Care | Payer: Medicaid Other | Source: Ambulatory Visit | Attending: Radiation Oncology | Admitting: Radiation Oncology

## 2018-12-06 DIAGNOSIS — Z510 Encounter for antineoplastic radiation therapy: Secondary | ICD-10-CM | POA: Diagnosis not present

## 2018-12-07 ENCOUNTER — Other Ambulatory Visit: Payer: Self-pay

## 2018-12-07 ENCOUNTER — Ambulatory Visit
Admission: RE | Admit: 2018-12-07 | Discharge: 2018-12-07 | Disposition: A | Discharge status: Home / Self Care | Payer: Medicaid Other | Source: Ambulatory Visit | Attending: Radiation Oncology | Admitting: Radiation Oncology

## 2018-12-07 DIAGNOSIS — Z510 Encounter for antineoplastic radiation therapy: Secondary | ICD-10-CM | POA: Diagnosis not present

## 2018-12-08 ENCOUNTER — Ambulatory Visit
Admission: RE | Admit: 2018-12-08 | Discharge: 2018-12-08 | Disposition: A | Discharge status: Home / Self Care | Payer: Medicaid Other | Source: Ambulatory Visit | Attending: Radiation Oncology | Admitting: Radiation Oncology

## 2018-12-08 ENCOUNTER — Other Ambulatory Visit: Payer: Self-pay

## 2018-12-08 DIAGNOSIS — Z510 Encounter for antineoplastic radiation therapy: Secondary | ICD-10-CM | POA: Diagnosis not present

## 2018-12-11 ENCOUNTER — Ambulatory Visit
Admission: RE | Admit: 2018-12-11 | Discharge: 2018-12-11 | Disposition: A | Discharge status: Home / Self Care | Payer: Medicaid Other | Source: Ambulatory Visit | Attending: Radiation Oncology | Admitting: Radiation Oncology

## 2018-12-11 ENCOUNTER — Other Ambulatory Visit: Payer: Self-pay

## 2018-12-11 ENCOUNTER — Encounter: Payer: Self-pay | Admitting: Radiation Oncology

## 2018-12-11 DIAGNOSIS — Z510 Encounter for antineoplastic radiation therapy: Secondary | ICD-10-CM | POA: Insufficient documentation

## 2018-12-11 DIAGNOSIS — C61 Malignant neoplasm of prostate: Secondary | ICD-10-CM | POA: Diagnosis present

## 2018-12-11 NOTE — Progress Notes (Addendum)
Received patient in clinic following final radiation treatment. Confirmed patient has a one month follow up appointment card. Patient reports pain in upper legs the same as it was on Friday when he was seen by Dr. Tammi Klippel for PUT encounter. Patient weak and slow with ambulation and transfer. Patient reports he is going to pick up his pain medication. Patient denies taking any pain medication today. Patient denies any change of bowel or bladder. Patient questions if radiation is causing his legs to hurt. Explained it was not the radiation. Offered to transport patient to emergency room for further evaluation of leg pain. Patient denied stating, "I just want to get my pain meds and go home." Stress if his legs become numbness, he is unable to stand or his bowel/bladder becomes compromised he should present to the emergency room immediately. Patient verbalized understanding of all reviewed.  Wheeled patient to lobby in wheelchair for discharge.

## 2018-12-14 ENCOUNTER — Other Ambulatory Visit: Payer: Self-pay | Admitting: Radiation Therapy

## 2018-12-20 ENCOUNTER — Encounter: Payer: Self-pay | Admitting: Family Medicine

## 2018-12-20 ENCOUNTER — Other Ambulatory Visit: Payer: Self-pay

## 2018-12-20 ENCOUNTER — Ambulatory Visit (INDEPENDENT_AMBULATORY_CARE_PROVIDER_SITE_OTHER): Payer: Medicaid Other | Admitting: Family Medicine

## 2018-12-20 VITALS — BP 128/67 | HR 99 | Temp 98.6°F | Resp 16 | Ht 72.5 in | Wt 135.0 lb

## 2018-12-20 DIAGNOSIS — R195 Other fecal abnormalities: Secondary | ICD-10-CM | POA: Diagnosis not present

## 2018-12-20 DIAGNOSIS — C61 Malignant neoplasm of prostate: Secondary | ICD-10-CM

## 2018-12-20 DIAGNOSIS — I10 Essential (primary) hypertension: Secondary | ICD-10-CM

## 2018-12-20 DIAGNOSIS — Z09 Encounter for follow-up examination after completed treatment for conditions other than malignant neoplasm: Secondary | ICD-10-CM

## 2018-12-20 DIAGNOSIS — K529 Noninfective gastroenteritis and colitis, unspecified: Secondary | ICD-10-CM | POA: Diagnosis not present

## 2018-12-20 MED ORDER — HYDROCHLOROTHIAZIDE 25 MG PO TABS: 25.0000 mg | ORAL_TABLET | Freq: Every day | ORAL | 5 refills | Status: DC

## 2018-12-20 MED ORDER — LOPERAMIDE HCL 2 MG PO TABS: 4.0000 mg | ORAL_TABLET | Freq: Four times a day (QID) | ORAL | 3 refills | Status: DC | PRN

## 2018-12-20 NOTE — Progress Notes (Signed)
[facility]    Established Patient Office Visit  Subjective:  Patient ID: Anthony Villanueva, male    DOB: 1955/09/05  Age: 63 y.o. MRN: [id number]  CC:  Chief Complaint  Patient presents with  . Hypertension  . Diarrhea    HPI Anthony Villanueva is a 63 year male who presents for Follow Up today.   Past Medical History:  Diagnosis Date  . Bone metastases ([place])   . Cancer [facility])    prostate  . Childhood asthma    "no longer gives me any problems" 02/10/18  . Chronic lower back pain   . Hypertension   . Loose stools   . Prostate cancer ([place])   . Rectal mass 10/27/2018  . Vitamin D deficiency    Current Status: Since his last office visit, he continues to have chronic GI problems. He has followed up with GI, but states that nothing has helped him with diarrhea symptoms. He states that he is having up to 10-12 diarrhea episodes daily. He is currently wearing depends to prevent accidents. He is currently following up with [facility] for pain medications. He received and completed 11 txs in Radiation Oncology for Prostate Cancer, per Dr. Tammi Klippel at [facility]. He denies fevers, chills, fatigue, recent infections, weight loss, and night sweats. He has not had any headaches, visual changes, dizziness, and falls. No chest pain, heart palpitations, cough and shortness of breath reported. No reports of GI problems such as nausea, vomiting, diarrhea, and constipation. He has no reports of blood in stools, dysuria and hematuria. No depression or anxiety reported. Patient is riding bus to doctor's appointments and is beginning to experience issues.   Past Surgical History:  Procedure Laterality Date  . MULTIPLE EXTRACTIONS WITH ALVEOLOPLASTY N/A 02/22/2018   Procedure: MULTIPLE EXTRACTION WITH ALVEOLOPLASTY;  Surgeon: Lenn Cal, DDS;  Location: [place];  Service: Oral Surgery;  Laterality: N/A;  NASAL TUBE  . PROSTATE BIOPSY  05/11/2017   CT  core bx L retroperitoneal LAN Archie Endo 05/11/2017    Family History  Problem Relation Age of Onset  . Cancer Mother        unknown type  . Stroke Brother   . Prostate cancer Neg Hx   . Colon cancer Neg Hx   . Breast cancer Neg Hx     Social History   Socioeconomic History  . Marital status: Significant Other    Spouse name: Not on file  . Number of children: 5  . Years of education: Not on file  . Highest education level: Not on file  Occupational History  . Not on file  Social Needs  . Financial resource strain: Not on file  . Food insecurity    Worry: Not on file    Inability: Not on file  . Transportation needs    Medical: Not on file    Non-medical: Not on file  Tobacco Use  . Smoking status: Current Some Day Smoker    Packs/day: 0.25    Years: 43.00    Pack years: 10.75    Types: Cigarettes    Last attempt to quit: 02/08/2018    Years since quitting: 0.8  . Smokeless tobacco: Never Used  Substance and Sexual Activity  . Alcohol use: Yes    Comment: 02/10/2018 "2 - 40oz beer/day"  . Drug use: Yes    Frequency: 7.0 times per week    Types: Marijuana    Comment: daily use  .  Sexual activity: Not Currently  Lifestyle  . Physical activity    Days per week: Not on file    Minutes per session: Not on file  . Stress: Not on file  Relationships  . Social Herbalist on phone: Not on file    Gets together: Not on file    Attends religious service: Not on file    Active member of club or organization: Not on file    Attends meetings of clubs or organizations: Not on file    Relationship status: Not on file  . Intimate partner violence    Fear of current or ex partner: Not on file    Emotionally abused: Not on file    Physically abused: Not on file    Forced sexual activity: Not on file  Other Topics Concern  . Not on file  Social History Narrative  . Not on file    Outpatient Medications Prior to Visit  Medication Sig Dispense Refill  .  enzalutamide (XTANDI) 40 MG capsule Take 4 capsules (160 mg total) by mouth daily. 120 capsule 0  . gabapentin (NEURONTIN) 600 MG tablet TK 1 T PO TID    . oxyCODONE (ROXICODONE) 15 MG immediate release tablet TK 1 T PO QID PRN    . Vitamin D, Ergocalciferol, (DRISDOL) 1.25 MG (50000 UT) CAPS capsule Take 1 capsule (50,000 Units total) by mouth every 7 (seven) days. 5 capsule 3  . hydrochlorothiazide (HYDRODIURIL) 25 MG tablet Take 1 tablet (25 mg total) by mouth daily. (Patient not taking: Reported on 12/20/2018) 30 tablet 5  . loperamide (IMODIUM A-D) 2 MG tablet Take 1 tablet (2 mg total) by mouth 4 (four) times daily as needed for diarrhea or loose stools. (Patient not taking: Reported on 12/20/2018) 30 tablet 3   No facility-administered medications prior to visit.     No Known Allergies  ROS Review of Systems  Constitutional: Positive for activity change, appetite change and fatigue.       Prostate Cancer  HENT: Negative.   Eyes: Negative.   Respiratory: Positive for shortness of breath (occasional ).   Cardiovascular: Negative.   Gastrointestinal: Positive for blood in stool (r/t chronic diarrhea), diarrhea (r/t colon mass) and rectal pain.  Endocrine: Negative.   Musculoskeletal: Positive for arthralgias (pain r/t diagnosis).  Skin: Negative.   Allergic/Immunologic: Negative.   Neurological: Positive for dizziness (occasional) and headaches (occasional ).  Hematological: Negative.   Psychiatric/Behavioral: Negative.       Objective:    Physical Exam  Constitutional: He is oriented to person, place, and time. He appears distressed.  Appears under nourhished.   HENT:  Head: Normocephalic and atraumatic.  Eyes: Conjunctivae are normal.  Neck: Normal range of motion. Neck supple.  Cardiovascular: Normal rate and intact distal pulses.  Pulmonary/Chest: Effort normal and breath sounds normal.  Abdominal: Soft. Bowel sounds are normal.  Musculoskeletal: Normal range of  motion.  Neurological: He is alert and oriented to person, place, and time. He has normal reflexes.  Skin: Skin is warm and dry.  Psychiatric: He has a normal mood and affect. His behavior is normal. Judgment and thought content normal.  Nursing note and vitals reviewed.   BP 128/67 (BP Location: Left Arm, Patient Position: Sitting, Cuff Size: Normal)   Pulse 99   Temp 98.6 F (37 C) (Oral)   Resp 16   Ht 6' 0.5" (1.842 m)   Wt 135 lb (61.2 kg)   SpO2 100%  BMI 18.06 kg/m  Wt Readings from Last 3 Encounters:  12/20/18 135 lb (61.2 kg)  11/21/18 135 lb 12.8 oz (61.6 kg)  10/27/18 140 lb (63.5 kg)     Health Maintenance Due  Topic Date Due  . Hepatitis C Screening  [birth year]  . INFLUENZA VACCINE  09/09/2018    There are no preventive care reminders to display for this patient.  Lab Results  Component Value Date   TSH 3.22 10/23/2018   Lab Results  Component Value Date   WBC 5.2 10/31/2018   HGB 9.7 (L) 10/31/2018   HCT 29.4 (L) 10/31/2018   MCV 88.1 10/31/2018   PLT 427.0 (H) 10/31/2018   Lab Results  Component Value Date   NA 135 10/23/2018   K 3.8 10/23/2018   CO2 25 10/23/2018   GLUCOSE 85 10/23/2018   BUN 12 10/23/2018   CREATININE 0.96 10/23/2018   BILITOT 0.7 09/28/2018   ALKPHOS 109 09/28/2018   AST 13 (L) 09/28/2018   ALT <6 09/28/2018   PROT 7.7 09/28/2018   ALBUMIN 3.2 (L) 09/28/2018   CALCIUM 9.4 10/23/2018   ANIONGAP 12 09/28/2018   GFR 96.22 10/23/2018   Lab Results  Component Value Date   CHOL 176 04/25/2018   Lab Results  Component Value Date   HDL 63 04/25/2018   Lab Results  Component Value Date   LDLCALC 90 04/25/2018   Lab Results  Component Value Date   TRIG 117 04/25/2018   Lab Results  Component Value Date   CHOLHDL 2.8 04/25/2018   No results found for: HGBA1C    Assessment & Plan:   1. Prostate cancer ([place]) Stable. He recently completed 11 Radiation Treatments with Dr. Tammi Klippel at [facility]. He seems to have  tolerated treatments well. We will continue to monitor.   2. Chronic diarrhea  3. Loose stools We will increase dosage of anti-diarrhea medication for better control of diarrhea - loperamide (IMODIUM A-D) 2 MG tablet; Take 2 tablets (4 mg total) by mouth 4 (four) times daily as needed for diarrhea or loose stools.  Dispense: 30 tablet; Refill: 3  4. Essential hypertension The current medical regimen is effective; blood pressure is stable at 128/67 today; continue present plan and medications as prescribed. She will continue to take medications as prescribed, to decrease high sodium intake, excessive alcohol intake, increase potassium intake, smoking cessation, and increase physical activity of at least 30 minutes of cardio activity daily. She will continue to follow Heart Healthy or DASH diet. - hydrochlorothiazide (HYDRODIURIL) 25 MG tablet; Take 1 tablet (25 mg total) by mouth daily.  Dispense: 30 tablet; Refill: 5  5. Follow up He will follow up in 2 months.  We will contact [name], LCSW  for transportation possible assistance with transportation issues.    Problem List Items Addressed This Visit      Cardiovascular and Mediastinum   Essential hypertension   Relevant Medications   hydrochlorothiazide (HYDRODIURIL) 25 MG tablet     Digestive   Chronic diarrhea     Genitourinary   Prostate cancer (HCC) - Primary     Other   Loose stools   Relevant Medications   loperamide (IMODIUM A-D) 2 MG tablet    Other Visit Diagnoses    Follow up          Meds ordered this encounter  Medications  . hydrochlorothiazide (HYDRODIURIL) 25 MG tablet    Sig: Take 1 tablet (25 mg total) by mouth daily.  Dispense:  30 tablet    Refill:  5  . loperamide (IMODIUM A-D) 2 MG tablet    Sig: Take 2 tablets (4 mg total) by mouth 4 (four) times daily as needed for diarrhea or loose stools.    Dispense:  30 tablet    Refill:  3    Follow-up: Return in about 2 months (around  02/19/2019).    Azzie Glatter, FNP

## 2018-12-21 ENCOUNTER — Telehealth: Payer: Self-pay | Admitting: Clinical

## 2018-12-21 NOTE — Telephone Encounter (Signed)
Integrated Behavioral Health Referral Note  Reason for Referral: Anthony Villanueva is a 63 y.o. male  Pt was referred by NP, Kathe Becton for: transportation   Pt reports the following concerns: takes the bus, which is challenging given his medical conditions  Assessment: Patient reports taking the bus to medical appointments, which is challenging given his medical conditions and symptoms. Patient indicated the cancer center is helping him with rides to his appointments there, but he needs assistance with rides to the Patient [place] and other appointments.   Plan: 1. Addressed today: Patient should be eligible for Medicaid transportation, as he has active Medicaid. Patient consented for CSW to contact Medicaid office regarding transportation. CSW awaiting return call from [facility] and will enroll patient in transportation services. Can also assist patient in applying for [place] to take other destinations besides medical appointments.   2. Referral: Medicaid transportation  3. Follow up: pending enrollment in Medicaid transportation  [name], [facility] [employer] [phone]

## 2018-12-27 ENCOUNTER — Encounter: Payer: Self-pay | Admitting: Family Medicine

## 2019-01-02 ENCOUNTER — Inpatient Hospital Stay: Payer: Medicaid Other | Attending: Radiation Oncology

## 2019-01-18 ENCOUNTER — Other Ambulatory Visit: Payer: Self-pay

## 2019-01-18 ENCOUNTER — Telehealth: Payer: Self-pay | Admitting: Urology

## 2019-01-18 ENCOUNTER — Ambulatory Visit
Admission: RE | Admit: 2019-01-18 | Discharge: 2019-01-18 | Disposition: A | Discharge status: Home / Self Care | Payer: Medicaid Other | Source: Ambulatory Visit | Attending: Urology | Admitting: Urology

## 2019-01-18 ENCOUNTER — Encounter: Payer: Self-pay | Admitting: Urology

## 2019-01-18 NOTE — Telephone Encounter (Signed)
Attempted to call patient x5 to conduct his routine scheduled 1 month follow up visit by telephone but the line remained busy. I will continue to try and reach him later today or tomorrow.  Nicholos Johns, MMS, PA-C [place] at [place]: [phone]  Fax: [phone]

## 2019-01-18 NOTE — Patient Instructions (Signed)
Coronavirus (COVID-19) Are you at risk?  Are you at risk for the Coronavirus (COVID-19)?  To be considered HIGH RISK for Coronavirus (COVID-19), you have to meet the following criteria:  . Traveled to [place], [place], [place], [place] or [place]; or in the [place] to [place], [place], [place], or [place]; and have fever, cough, and shortness of breath within the last 2 weeks of travel OR . Been in close contact with a person diagnosed with COVID-19 within the last 2 weeks and have fever, cough, and shortness of breath . IF YOU DO NOT MEET THESE CRITERIA, YOU ARE CONSIDERED LOW RISK FOR COVID-19.  What to do if you are HIGH RISK for COVID-19?  . If you are having a medical emergency, call 911. . Seek medical care right away. Before you go to a doctor's office, urgent care or emergency department, call ahead and tell them about your recent travel, contact with someone diagnosed with COVID-19, and your symptoms. You should receive instructions from your physician's office regarding next steps of care.  . When you arrive at healthcare provider, tell the healthcare staff immediately you have returned from visiting [place], [place], [place], [place] or [place]; or traveled in the [place] to [place], [place], [place], or [place]; in the last two weeks or you have been in close contact with a person diagnosed with COVID-19 in the last 2 weeks.   . Tell the health care staff about your symptoms: fever, cough and shortness of breath. . After you have been seen by a medical provider, you will be either: o Tested for (COVID-19) and discharged home on quarantine except to seek medical care if symptoms worsen, and asked to  - Stay home and avoid contact with others until you get your results (4-5 days)  - Avoid travel on public transportation if possible (such as bus, train, or airplane) or o Sent to the Emergency Department by EMS for evaluation, COVID-19 testing, and possible  admission depending on your condition and test results.  What to do if you are LOW RISK for COVID-19?  Reduce your risk of any infection by using the same precautions used for avoiding the common cold or flu:  . Wash your hands often with soap and warm water for at least 20 seconds.  If soap and water are not readily available, use an alcohol-based hand sanitizer with at least 60% alcohol.  . If coughing or sneezing, cover your mouth and nose by coughing or sneezing into the elbow areas of your shirt or coat, into a tissue or into your sleeve (not your hands). . Avoid shaking hands with others and consider head nods or verbal greetings only. . Avoid touching your eyes, nose, or mouth with unwashed hands.  . Avoid close contact with people who are sick. . Avoid places or events with large numbers of people in one location, like concerts or sporting events. . Carefully consider travel plans you have or are making. . If you are planning any travel outside or inside the [place], visit the CDC's Travelers' Health webpage for the latest health notices. . If you have some symptoms but not all symptoms, continue to monitor at home and seek medical attention if your symptoms worsen. . If you are having a medical emergency, call 911.   ADDITIONAL HEALTHCARE OPTIONS FOR PATIENTS  [facility] / e-Visit: [email or web address]         [facility]: [phone]  [facility]: [phone]                   [facility]: [phone]   

## 2019-01-18 NOTE — Progress Notes (Signed)
Spoke to the patient on the phone doing fair has fallen twice in the last few days and has injuryed his knee. Denies need to have it looked at. Denies need for a walker. AUA score 13

## 2019-01-26 ENCOUNTER — Other Ambulatory Visit: Payer: Self-pay

## 2019-01-26 DIAGNOSIS — C61 Malignant neoplasm of prostate: Secondary | ICD-10-CM

## 2019-01-26 MED ORDER — XTANDI 40 MG PO CAPS: ORAL_CAPSULE | ORAL | 0 refills | Status: DC

## 2019-01-26 NOTE — Telephone Encounter (Signed)
Received message from patient that he will run out of his [employer]. Made Dr. Alen Blew aware and received verbal order to refill. Refill sent to [facility] and confirmed receipt. Attempted to contact patient x2 at provided/listed contact and unable to get through or leave message to make aware that refill has been sent to pharmacy.

## 2019-01-28 NOTE — Progress Notes (Signed)
  Radiation Oncology         [phone]) [phone] ________________________________  Name: Anthony Villanueva MRN: [id number]  Date: 12/11/2018  DOB: Jan 29, 1956  End of Treatment Note  Diagnosis:   63 yo man with locally advanced prostate cancer invading rectum with rectal symptoms     Indication for treatment:  Palliation    Radiation treatment dates:   11/28/18-12/11/18       Site/dose:   The prostate with extension into rectum was treated to 30 Gy in 10 fractions of 3 Gy  Beams/energy:   3D technique was used with 5 fields and 2 reduced fields with 15 MV X-rays  Narrative: The patient tolerated radiation treatment relatively well.     Plan: The patient has completed radiation treatment. The patient will return to radiation oncology clinic for routine followup in one month. I advised him to call or return sooner if he has any questions or concerns related to his recovery or treatment. ________________________________  Sheral Apley. Tammi Klippel, M.D.

## 2019-01-30 ENCOUNTER — Other Ambulatory Visit: Payer: Self-pay

## 2019-01-30 ENCOUNTER — Inpatient Hospital Stay: Payer: Medicaid Other

## 2019-01-30 ENCOUNTER — Telehealth: Payer: Self-pay | Admitting: Oncology

## 2019-01-30 ENCOUNTER — Telehealth: Payer: Self-pay | Admitting: Pharmacist

## 2019-01-30 ENCOUNTER — Inpatient Hospital Stay: Payer: Medicaid Other | Attending: Radiation Oncology | Admitting: Oncology

## 2019-01-30 VITALS — BP 130/77 | HR 109 | Temp 97.8°F | Resp 18 | Ht 72.5 in | Wt 134.0 lb

## 2019-01-30 DIAGNOSIS — Z5111 Encounter for antineoplastic chemotherapy: Secondary | ICD-10-CM | POA: Insufficient documentation

## 2019-01-30 DIAGNOSIS — C61 Malignant neoplasm of prostate: Secondary | ICD-10-CM

## 2019-01-30 DIAGNOSIS — C7951 Secondary malignant neoplasm of bone: Secondary | ICD-10-CM | POA: Insufficient documentation

## 2019-01-30 LAB — CMP (CANCER CENTER ONLY)
ALT: <6 U/L (ref 0–44)
AST: 12 U/L — ABNORMAL LOW (ref 15–41)
Albumin: 2.4 g/dL — ABNORMAL LOW (ref 3.5–5.0)
Alkaline Phosphatase: 100 U/L (ref 38–126)
Anion gap: 9 (ref 5–15)
BUN: 5 mg/dL — ABNORMAL LOW (ref 8–23)
CO2: 29 mmol/L (ref 22–32)
Calcium: 8.5 mg/dL — ABNORMAL LOW (ref 8.9–10.3)
Chloride: 103 mmol/L (ref 98–111)
Creatinine: 0.67 mg/dL (ref 0.61–1.24)
GFR, Est AFR Am: >60 mL/min (ref >60)
GFR, Estimated: >60 mL/min (ref >60)
Glucose, Bld: 84 mg/dL (ref 70–99)
Potassium: 3.4 mmol/L — ABNORMAL LOW (ref 3.5–5.1)
Sodium: 141 mmol/L (ref 135–145)
Total Bilirubin: 0.2 mg/dL — ABNORMAL LOW (ref 0.3–1.2)
Total Protein: 6.6 g/dL (ref 6.5–8.1)

## 2019-01-30 LAB — CBC WITH DIFFERENTIAL (CANCER CENTER ONLY)
Abs Immature Granulocytes: 0.03 10*3/uL (ref 0.00–0.07)
Basophils Absolute: 0 10*3/uL (ref 0.0–0.1)
Basophils Relative: 0 %
Eosinophils Absolute: 0 10*3/uL (ref 0.0–0.5)
Eosinophils Relative: 1 %
HCT: 26.1 % — ABNORMAL LOW (ref 39.0–52.0)
Hemoglobin: 8 g/dL — ABNORMAL LOW (ref 13.0–17.0)
Immature Granulocytes: 1 %
Lymphocytes Relative: 6 %
Lymphs Abs: 0.3 10*3/uL — ABNORMAL LOW (ref 0.7–4.0)
MCH: 28.7 pg (ref 26.0–34.0)
MCHC: 30.7 g/dL (ref 30.0–36.0)
MCV: 93.5 fL (ref 80.0–100.0)
Monocytes Absolute: 0.4 10*3/uL (ref 0.1–1.0)
Monocytes Relative: 9 %
Neutro Abs: 3.7 10*3/uL (ref 1.7–7.7)
Neutrophils Relative %: 83 %
Platelet Count: 294 10*3/uL (ref 150–400)
RBC: 2.79 MIL/uL — ABNORMAL LOW (ref 4.22–5.81)
RDW: 18.5 % — ABNORMAL HIGH (ref 11.5–15.5)
WBC Count: 4.5 10*3/uL (ref 4.0–10.5)
nRBC: 0 % (ref 0.0–0.2)

## 2019-01-30 MED ORDER — LEUPROLIDE ACETATE (6 MONTH) 45 MG ~~LOC~~ KIT
30.0000 mg | PACK | Freq: Once | SUBCUTANEOUS | Status: AC
  Administered 2019-01-30: 30 mg via SUBCUTANEOUS
  Filled 2019-01-30: qty 45

## 2019-01-30 MED ORDER — ABIRATERONE ACETATE 250 MG PO TABS: 1000.0000 mg | ORAL_TABLET | Freq: Every day | ORAL | 0 refills | Status: DC

## 2019-01-30 NOTE — Telephone Encounter (Signed)
Oral Oncology Pharmacist Encounter  Received new prescription for Zytiga (abiraterone) for the treatment of castrate resistant metastatic prostate cancer in conjunction with ADT, planned duration until disease progression or unacceptable drug toxicity.  CMP from 01/30/2019 assessed, no relevant lab abnormalities. BP on 01/30/2019 well controlled. Prescription dose and frequency assessed.   Current medication list in Epic reviewed, no DDIs with abiraterone identified.  Prescription has been e-scribed to the [facility] for benefits analysis and approval.  Oral Oncology Clinic will continue to follow for insurance authorization, copayment issues, initial counseling and start date.  Darl Pikes, PharmD, BCPS, [facility] Hematology/Oncology Clinical Pharmacist [facility]/HP/AP [facility] [place] [facility] [phone]  01/30/2019 11:41 AM

## 2019-01-30 NOTE — Telephone Encounter (Signed)
Scheduled appt per 12/22 los,  Patient line was busy.  Sent a message to HIM pool to get a calendar mailed out.

## 2019-01-30 NOTE — Progress Notes (Signed)
Hematology and Oncology Follow Up Visit  Anthony Villanueva [id number] July 16, 1955 63 y.o. 01/30/2019 10:47 AM Azzie Glatter, FNPStroud, Ellie Lunch, FNP   Principle Diagnosis: 63 year old man with advanced prostate cancer with disease to the bone diagnosed April 2019.  He was found to have PSA of 193 April 2019 with castration-sensitive disease.   Prior Therapy: He is status post CT-guided biopsy of retroperitoneal adenopathy on April 3 of 2019  Current therapy:  Lupron 30 mg every 4 months with the first dose given on May 31, 2017.  Xtandi 160 mg daily started in July 2019.  He status post radiation therapy receiving 30 Gy in 10 fractions to the prostate between October 20 and December 11, 2018.  Interim History: Anthony Villanueva presents today for a repeat evaluation.  Since the last visit, he reports completing radiation therapy without any major complaints at this time.  He does report some overall fatigue and tiredness as well as residual diarrhea.  He has tolerated Xtandi reasonably well although he ran out of this medication as of late.  His performance status and quality of life slowly declining and have lost some weight.   He denied any alteration mental status, neuropathy, confusion or dizziness.  Denies any headaches or lethargy.  Denies any night sweats, weight loss or changes in appetite.  Denied orthopnea, dyspnea on exertion or chest discomfort.  Denies shortness of breath, difficulty breathing hemoptysis or cough.  Denies any abdominal distention, nausea, early satiety or dyspepsia.  Denies any hematuria, frequency, dysuria or nocturia.  Denies any skin irritation, dryness or rash.  Denies any ecchymosis or petechiae.  Denies any lymphadenopathy or clotting.  Denies any heat or cold intolerance.  Denies any anxiety or depression.  Remaining review of system is negative.     Medications: Without any changes on review. Current Outpatient Medications  Medication Sig Dispense Refill   . Acetaminophen 500 MG coapsule SMARTSIG:2 Capsule(s) By Mouth 3 Times Daily PRN    . enzalutamide (XTANDI) 40 MG capsule Take 4 capsules (160 mg total) by mouth daily. 120 capsule 0  . gabapentin (NEURONTIN) 600 MG tablet TK 1 T PO TID    . hydrochlorothiazide (HYDRODIURIL) 25 MG tablet Take 1 tablet (25 mg total) by mouth daily. 30 tablet 5  . loperamide (IMODIUM A-D) 2 MG tablet Take 2 tablets (4 mg total) by mouth 4 (four) times daily as needed for diarrhea or loose stools. 30 tablet 3  . oxyCODONE (ROXICODONE) 15 MG immediate release tablet TK 1 T PO QID PRN    . Vitamin D, Ergocalciferol, (DRISDOL) 1.25 MG (50000 UT) CAPS capsule Take 1 capsule (50,000 Units total) by mouth every 7 (seven) days. 5 capsule 3   No current facility-administered medications for this visit.     Allergies: No Known Allergies  Past Medical History, Surgical history, Social history, and Family History updated without any changes.    Physical Exam:   Blood pressure 130/77, pulse (!) 109, temperature 97.8 F (36.6 C), temperature source Temporal, resp. rate 18, height 6' 0.5" (1.842 m), weight 134 lb (60.8 kg), SpO2 100 %.    ECOG: 1    General appearance: Comfortable appearing without any discomfort Head: Normocephalic without any trauma Oropharynx: Mucous membranes are moist and pink without any thrush or ulcers. Eyes: Pupils are equal and round reactive to light. Lymph nodes: No cervical, supraclavicular, inguinal or axillary lymphadenopathy.   Heart:regular rate and rhythm.  S1 and S2 without leg edema. Lung: Clear without any  rhonchi or wheezes.  No dullness to percussion. Abdomin: Soft, nontender, nondistended with good bowel sounds.  No hepatosplenomegaly. Musculoskeletal: No joint deformity or effusion.  Full range of motion noted. Neurological: No deficits noted on motor, sensory and deep tendon reflex exam. Skin: No petechial rash or dryness.  Appeared moist.          Lab  Results: Lab Results  Component Value Date   WBC 4.5 01/30/2019   HGB 8.0 (L) 01/30/2019   HCT 26.1 (L) 01/30/2019   MCV 93.5 01/30/2019   PLT 294 01/30/2019     Chemistry      Component Value Date/Time   NA 135 10/23/2018 1622   NA 137 06/17/2017 1223   K 3.8 10/23/2018 1622   CL 99 10/23/2018 1622   CO2 25 10/23/2018 1622   BUN 12 10/23/2018 1622   BUN 8 06/17/2017 1223   CREATININE 0.96 10/23/2018 1622   CREATININE 0.86 09/28/2018 0959      Component Value Date/Time   CALCIUM 9.4 10/23/2018 1622   ALKPHOS 109 09/28/2018 0959   AST 13 (L) 09/28/2018 0959   ALT <6 09/28/2018 0959   BILITOT 0.7 09/28/2018 0959      Results for Anthony Villanueva (MRN [id number]) as of 01/30/2019 10:49  Ref. Range 06/06/2018 11:34 09/28/2018 09:59  Prostate Specific Ag, Serum Latest Ref Range: 0.0 - 4.0 ng/mL 5.2 (H) 15.0 (H)     Impression and Plan:  63 year old man with:  1.    Castration-sensitive advanced prostate cancer with disease to the bone and lymphadenopathy noted in 2019.  He is developing castration-resistant disease.  The natural course of his disease was updated today.  His PSA continues to rise on Xtandi which could indicate castration resistant disease.  Bone scan obtained on August 2020 does show progression of disease but his lymphadenopathy did show some improvement at that time.  Treatment options moving forward were reviewed which include continuing Xtandi, switching to Zytiga versus systemic chemotherapy.  After discussion today, I opted to discontinue Xtandi and proceed with Zytiga.  Complications include fatigue, hypertension among others were reviewed.   2.   Androgen deprivation: He is scheduled to receive Eligard today and will be repeated in 4 months.   3.  Bone directed therapy: I recommended calcium and vitamin D supplements.  Delton See have been on hold till he obtains dental clearance.  4.  Prognosis: Therapy remains palliative although aggressive measures  were warranted given his reasonable performance status.  5.  Right-sided leg and hip pain: Improved overall after radiation.  6.  Follow-up: In 2 months for repeat follow-up.    25  minutes was spent with the patient face-to-face today.  More than 50% of time was dedicated to updating his disease status, treatment options and outlining future plan of care.     Zola Button, MD 12/22/202010:47 AM

## 2019-01-30 NOTE — Patient Instructions (Signed)
Leuprolide injection What is this medicine? LEUPROLIDE (loo PROE lide) is a man-made hormone. It is used to treat the symptoms of prostate cancer. This medicine may also be used to treat children with early onset of puberty. It may be used for other hormonal conditions. This medicine may be used for other purposes; ask your health care provider or pharmacist if you have questions. COMMON BRAND NAME(S): Lupron What should I tell my health care provider before I take this medicine? They need to know if you have any of these conditions:  diabetes  heart disease or previous heart attack  high blood pressure  high cholesterol  pain or difficulty passing urine  spinal cord metastasis  stroke  tobacco smoker  an unusual or allergic reaction to leuprolide, benzyl alcohol, other medicines, foods, dyes, or preservatives  pregnant or trying to get pregnant  breast-feeding How should I use this medicine? This medicine is for injection under the skin or into a muscle. You will be taught how to prepare and give this medicine. Use exactly as directed. Take your medicine at regular intervals. Do not take your medicine more often than directed. It is important that you put your used needles and syringes in a special sharps container. Do not put them in a trash can. If you do not have a sharps container, call your pharmacist or healthcare provider to get one. A special MedGuide will be given to you by the pharmacist with each prescription and refill. Be sure to read this information carefully each time. Talk to your pediatrician regarding the use of this medicine in children. While this medicine may be prescribed for children as young as 8 years for selected conditions, precautions do apply. Overdosage: If you think you have taken too much of this medicine contact a poison control center or emergency room at once. NOTE: This medicine is only for you. Do not share this medicine with others. What if  I miss a dose? If you miss a dose, take it as soon as you can. If it is almost time for your next dose, take only that dose. Do not take double or extra doses. What may interact with this medicine? Do not take this medicine with any of the following medications:  chasteberry This medicine may also interact with the following medications:  herbal or dietary supplements, like black cohosh or DHEA  male hormones, like estrogens or progestins and birth control pills, patches, rings, or injections  male hormones, like testosterone This list may not describe all possible interactions. Give your health care provider a list of all the medicines, herbs, non-prescription drugs, or dietary supplements you use. Also tell them if you smoke, drink alcohol, or use illegal drugs. Some items may interact with your medicine. What should I watch for while using this medicine? Visit your doctor or health care professional for regular checks on your progress. During the first week, your symptoms may get worse, but then will improve as you continue your treatment. You may get hot flashes, increased bone pain, increased difficulty passing urine, or an aggravation of nerve symptoms. Discuss these effects with your doctor or health care professional, some of them may improve with continued use of this medicine. Male patients may experience a menstrual cycle or spotting during the first 2 months of therapy with this medicine. If this continues, contact your doctor or health care professional. This medicine may increase blood sugar. Ask your healthcare provider if changes in diet or medicines are needed if   you have diabetes. What side effects may I notice from receiving this medicine? Side effects that you should report to your doctor or health care professional as soon as possible:  allergic reactions like skin rash, itching or hives, swelling of the face, lips, or tongue  breathing problems  chest  pain  depression or memory disorders  pain in your legs or groin  pain at site where injected  severe headache  signs and symptoms of high blood sugar such as being more thirsty or hungry or having to urinate more than normal. You may also feel very tired or have blurry vision  swelling of the feet and legs  visual changes  vomiting Side effects that usually do not require medical attention (report to your doctor or health care professional if they continue or are bothersome):  breast swelling or tenderness  decrease in sex drive or performance  diarrhea  hot flashes  loss of appetite  muscle, joint, or bone pains  nausea  redness or irritation at site where injected  skin problems or acne This list may not describe all possible side effects. Call your doctor for medical advice about side effects. You may report side effects to FDA at 1-800-FDA-1088. Where should I keep my medicine? Keep out of the reach of children. Store below 25 degrees C (77 degrees F). Do not freeze. Protect from light. Do not use if it is not clear or if there are particles present. Throw away any unused medicine after the expiration date. NOTE: This sheet is a summary. It may not cover all possible information. If you have questions about this medicine, talk to your doctor, pharmacist, or health care provider.  2020 Elsevier/Gold Standard (2017-11-24 09:52:48)  

## 2019-01-31 LAB — PROSTATE-SPECIFIC AG, SERUM (LABCORP): Prostate Specific Ag, Serum: 25.6 ng/mL — ABNORMAL HIGH (ref 0.0–4.0)

## 2019-02-16 ENCOUNTER — Telehealth: Payer: Self-pay | Admitting: Clinical

## 2019-02-16 NOTE — Telephone Encounter (Signed)
Integrated Behavioral Health Referral Note  Patient was previously referred to CSW for transportation needs. Patient is signed up for [employer]. Called patient x3 this week to offer assistance with scheduling transportation to PCP appointment next week. No answer and no voicemail available at each call attempt, so have not scheduled transportation.   [name], [facility] [phone]

## 2019-02-19 ENCOUNTER — Ambulatory Visit: Payer: Self-pay | Admitting: Family Medicine

## 2019-02-20 ENCOUNTER — Other Ambulatory Visit: Payer: Self-pay

## 2019-02-20 ENCOUNTER — Encounter: Payer: Self-pay | Admitting: Family Medicine

## 2019-02-20 ENCOUNTER — Ambulatory Visit (INDEPENDENT_AMBULATORY_CARE_PROVIDER_SITE_OTHER): Payer: Medicaid Other | Admitting: Family Medicine

## 2019-02-20 VITALS — BP 103/73 | HR 116 | Temp 98.6°F | Ht 72.0 in | Wt 122.8 lb

## 2019-02-20 DIAGNOSIS — R195 Other fecal abnormalities: Secondary | ICD-10-CM

## 2019-02-20 DIAGNOSIS — G8929 Other chronic pain: Secondary | ICD-10-CM

## 2019-02-20 DIAGNOSIS — C61 Malignant neoplasm of prostate: Secondary | ICD-10-CM

## 2019-02-20 DIAGNOSIS — Z09 Encounter for follow-up examination after completed treatment for conditions other than malignant neoplasm: Secondary | ICD-10-CM

## 2019-02-20 DIAGNOSIS — Z789 Other specified health status: Secondary | ICD-10-CM

## 2019-02-20 DIAGNOSIS — I10 Essential (primary) hypertension: Secondary | ICD-10-CM

## 2019-02-20 DIAGNOSIS — E559 Vitamin D deficiency, unspecified: Secondary | ICD-10-CM

## 2019-02-20 LAB — POCT URINALYSIS DIPSTICK
Bilirubin, UA: NEGATIVE
Glucose, UA: NEGATIVE
Ketones, UA: NEGATIVE
Leukocytes, UA: NEGATIVE
Nitrite, UA: NEGATIVE
Protein, UA: POSITIVE — AB
Spec Grav, UA: 1.025 (ref 1.010–1.025)
Urobilinogen, UA: 0.2 E.U./dL
pH, UA: 5.5 (ref 5.0–8.0)

## 2019-02-20 MED ORDER — VITAMIN D (ERGOCALCIFEROL) 1.25 MG (50000 UNIT) PO CAPS: 50000.0000 [IU] | ORAL_CAPSULE | ORAL | 3 refills | Status: DC

## 2019-02-20 NOTE — Progress Notes (Signed)
Patient [name]   Established Patient Office Visit  Subjective:  Patient ID: Anthony Villanueva, male    DOB: 1955-08-20  Age: 64 y.o. MRN: [id number]  CC:  Chief Complaint  Patient presents with  . Follow-up    HTN    HPI Anthony Villanueva is a 64 year old male who presents for Follow Up today.   Past Medical History:  Diagnosis Date  . Bone metastases ([place])   . Cancer [facility])    prostate  . Childhood asthma    "no longer gives me any problems" 02/10/18  . Chronic diarrhea   . Chronic lower back pain   . Hypertension   . Loose stools   . Prostate cancer ([place])   . Rectal mass 10/27/2018  . Vitamin D deficiency    Current Status: Since his last office visit, he is doing well with no complaints. He continues to follow up with Oncology as needed for rectal mass. He continues to have mild episodes of diarrhea. He denies any other GI problems such as nausea, vomiting, and constipation. He has no reports of blood in stools, dysuria and hematuria.He denies fevers, chills, fatigue, recent infections, weight loss, and night sweats. He has not had any headaches, visual changes, dizziness, and falls. No chest pain, heart palpitations, cough and shortness of breath reported.  His anxiety is moderate today, r/t his healthcare status. He denies suicidal ideations, homicidal ideations, or auditory hallucinations. He states that his pain is pain mild today.   Past Surgical History:  Procedure Laterality Date  . MULTIPLE EXTRACTIONS WITH ALVEOLOPLASTY N/A 02/22/2018   Procedure: MULTIPLE EXTRACTION WITH ALVEOLOPLASTY;  Surgeon: Lenn Cal, DDS;  Location: [facility];  Service: Oral Surgery;  Laterality: N/A;  NASAL TUBE  . PROSTATE BIOPSY  05/11/2017   CT core bx L retroperitoneal LAN Archie Endo 05/11/2017    Family History  Problem Relation Age of Onset  . Cancer Mother        unknown type  . Stroke Brother   . Prostate cancer Neg Hx   . Colon cancer Neg  Hx   . Breast cancer Neg Hx     Social History   Socioeconomic History  . Marital status: Significant Other    Spouse name: Not on file  . Number of children: 5  . Years of education: Not on file  . Highest education level: Not on file  Occupational History  . Not on file  Tobacco Use  . Smoking status: Current Some Day Smoker    Packs/day: 0.25    Years: 43.00    Pack years: 10.75    Types: Cigarettes    Last attempt to quit: 02/08/2018    Years since quitting: 1.0  . Smokeless tobacco: Never Used  Substance and Sexual Activity  . Alcohol use: Yes    Comment: 02/10/2018 "2 - 40oz beer/day"  . Drug use: Yes    Frequency: 7.0 times per week    Types: Marijuana    Comment: daily use  . Sexual activity: Not Currently  Other Topics Concern  . Not on file  Social History Narrative  . Not on file   Social Determinants of Health   Financial Resource Strain:   . Difficulty of Paying Living Expenses: Not on file  Food Insecurity:   . Worried About Charity fundraiser in the Last Year: Not on file  . Ran Out of Food in the Last Year: Not on  file  Transportation Needs:   . Film/video editor (Medical): Not on file  . Lack of Transportation (Non-Medical): Not on file  Physical Activity:   . Days of Exercise per Week: Not on file  . Minutes of Exercise per Session: Not on file  Stress:   . Feeling of Stress : Not on file  Social Connections:   . Frequency of Communication with Friends and Family: Not on file  . Frequency of Social Gatherings with Friends and Family: Not on file  . Attends Religious Services: Not on file  . Active Member of Clubs or Organizations: Not on file  . Attends Archivist Meetings: Not on file  . Marital Status: Not on file  Intimate Partner Violence:   . Fear of Current or Ex-Partner: Not on file  . Emotionally Abused: Not on file  . Physically Abused: Not on file  . Sexually Abused: Not on file    Outpatient Medications Prior to  Visit  Medication Sig Dispense Refill  . gabapentin (NEURONTIN) 600 MG tablet TK 1 T PO TID    . hydrochlorothiazide (HYDRODIURIL) 25 MG tablet Take 1 tablet (25 mg total) by mouth daily. 30 tablet 5  . oxyCODONE (ROXICODONE) 15 MG immediate release tablet TK 1 T PO QID PRN    . abiraterone acetate (ZYTIGA) 250 MG tablet Take 4 tablets (1,000 mg total) by mouth daily. Take on an empty stomach 1 hour before or 2 hours after a meal (Patient not taking: Reported on 02/20/2019) 120 tablet 0  . Acetaminophen 500 MG coapsule SMARTSIG:2 Capsule(s) By Mouth 3 Times Daily PRN    . loperamide (IMODIUM A-D) 2 MG tablet Take 2 tablets (4 mg total) by mouth 4 (four) times daily as needed for diarrhea or loose stools. (Patient not taking: Reported on 02/20/2019) 30 tablet 3  . Vitamin D, Ergocalciferol, (DRISDOL) 1.25 MG (50000 UT) CAPS capsule Take 1 capsule (50,000 Units total) by mouth every 7 (seven) days. (Patient not taking: Reported on 02/20/2019) 5 capsule 3   No facility-administered medications prior to visit.    No Known Allergies  ROS Review of Systems  Constitutional: Negative.   HENT: Negative.   Eyes: Negative.   Respiratory: Negative.   Cardiovascular: Negative.   Gastrointestinal: Positive for diarrhea (occasional ).  Endocrine: Negative.   Genitourinary: Negative.   Musculoskeletal: Positive for arthralgias (generalized pain).  Skin: Negative.   Allergic/Immunologic: Negative.   Neurological: Positive for dizziness (occasional ) and headaches (occasional ).  Hematological: Negative.   Psychiatric/Behavioral: Negative.       Objective:    Physical Exam  Constitutional: He is oriented to person, place, and time.  Malnourished Ambulates via cane   HENT:  Head: Normocephalic and atraumatic.  Eyes: Conjunctivae are normal.  Cardiovascular: Normal rate, regular rhythm, normal heart sounds and intact distal pulses.  Pulmonary/Chest: Effort normal and breath sounds normal.    Abdominal: Soft. Bowel sounds are normal.  Musculoskeletal:        General: Normal range of motion.     Cervical back: Normal range of motion and neck supple.  Neurological: He is alert and oriented to person, place, and time. He has normal reflexes.  Skin: Skin is warm and dry.  Psychiatric: He has a normal mood and affect. His behavior is normal. Judgment and thought content normal.  Nursing note and vitals reviewed.   BP 103/73   Pulse (!) 116   Temp 98.6 F (37 C) (Oral)   Ht  6' (1.829 m)   Wt 122 lb 12.8 oz (55.7 kg)   SpO2 96%   BMI 16.65 kg/m  Wt Readings from Last 3 Encounters:  02/20/19 122 lb 12.8 oz (55.7 kg)  01/30/19 134 lb (60.8 kg)  12/20/18 135 lb (61.2 kg)     Health Maintenance Due  Topic Date Due  . Hepatitis C Screening  1955-07-04  . INFLUENZA VACCINE  09/09/2018    There are no preventive care reminders to display for this patient.  Lab Results  Component Value Date   TSH 3.22 10/23/2018   Lab Results  Component Value Date   WBC 4.5 01/30/2019   HGB 8.0 (L) 01/30/2019   HCT 26.1 (L) 01/30/2019   MCV 93.5 01/30/2019   PLT 294 01/30/2019   Lab Results  Component Value Date   NA 141 01/30/2019   K 3.4 (L) 01/30/2019   CO2 29 01/30/2019   GLUCOSE 84 01/30/2019   BUN 5 (L) 01/30/2019   CREATININE 0.67 01/30/2019   BILITOT 0.2 (L) 01/30/2019   ALKPHOS 100 01/30/2019   AST 12 (L) 01/30/2019   ALT <6 01/30/2019   PROT 6.6 01/30/2019   ALBUMIN 2.4 (L) 01/30/2019   CALCIUM 8.5 (L) 01/30/2019   ANIONGAP 9 01/30/2019   GFR 96.22 10/23/2018   Lab Results  Component Value Date   CHOL 176 04/25/2018   Lab Results  Component Value Date   HDL 63 04/25/2018   Lab Results  Component Value Date   LDLCALC 90 04/25/2018   Lab Results  Component Value Date   TRIG 117 04/25/2018   Lab Results  Component Value Date   CHOLHDL 2.8 04/25/2018   No results found for: HGBA1C    Assessment & Plan:   1. Essential hypertension The  current medical regimen is effective; blood pressure is stable at 103/73 today; continue present plan and medications as prescribed. She will continue to take medications as prescribed, to decrease high sodium intake, excessive alcohol intake, increase potassium intake, smoking cessation, and increase physical activity of at least 30 minutes of cardio activity daily. She will continue to follow Heart Healthy or DASH diet. - Urinalysis Dipstick  2. Vitamin D deficiency - Vitamin D, Ergocalciferol, (DRISDOL) 1.25 MG (50000 UNIT) CAPS capsule; Take 1 capsule (50,000 Units total) by mouth every 7 (seven) days.  Dispense: 5 capsule; Refill: 3  3. Prostate cancer ([place])  4. Other chronic pain  5. Loose stools  6. Follow up He will follow up 07/2019.  Meds ordered this encounter  Medications  . Vitamin D, Ergocalciferol, (DRISDOL) 1.25 MG (50000 UNIT) CAPS capsule    Sig: Take 1 capsule (50,000 Units total) by mouth every 7 (seven) days.    Dispense:  5 capsule    Refill:  3    Orders Placed This Encounter  Procedures  . Urinalysis Dipstick    Referral Orders  No referral(s) requested today    Kathe Becton,  MSN, FNP-BC [facility] [address] [place], [place] [address] [phone] fax   Problem List Items Addressed This Visit      Cardiovascular and Mediastinum   Essential hypertension - Primary   Relevant Orders   Urinalysis Dipstick (Completed)     Genitourinary   Prostate cancer ([place])     Other   Loose stools    Other Visit Diagnoses    Vitamin D deficiency       Relevant  Medications   Vitamin D, Ergocalciferol, (DRISDOL) 1.25 MG (50000 UNIT) CAPS capsule   Other chronic pain       Follow up          Meds ordered this encounter  Medications  . Vitamin D, Ergocalciferol, (DRISDOL) 1.25 MG (50000 UNIT) CAPS capsule    Sig: Take 1 capsule (50,000 Units total) by mouth every  7 (seven) days.    Dispense:  5 capsule    Refill:  3    Follow-up: Return in about 5 months (around 07/21/2019) for OV.    Azzie Glatter, FNP

## 2019-02-20 NOTE — Progress Notes (Signed)
Integrated Behavioral Health Referral Note  Reason for Referral: Anthony Villanueva is a 65 y.o. male  Pt was referred by NP, Kathe Becton previously for: transportation needs  Pt reports the following concerns: due to medical conditions, has difficulty using public transportation  Plan: 1. Addressed today: Assessed over the phone in November for transportation needs. Referred patient at that time to [facility] transportation. Patient reported that he is now signed up for Medicaid transport, but lost the number to call to schedule rides. Provided patient with the number. Patient can use Medicaid transport to any medical appointments. Patient is also signed up for [facility] transportation services.  Also completed SCAT application with patient today and will submit to [place] office. Patient can use SCAT for non-medical transport within [place].  2. Referral: SCAT  3. Follow up: as needed  [name], [facility] [phone]

## 2019-02-22 ENCOUNTER — Telehealth: Payer: Self-pay | Admitting: Pharmacist

## 2019-02-22 DIAGNOSIS — E559 Vitamin D deficiency, unspecified: Secondary | ICD-10-CM | POA: Insufficient documentation

## 2019-02-22 DIAGNOSIS — G8929 Other chronic pain: Secondary | ICD-10-CM | POA: Insufficient documentation

## 2019-02-22 MED FILL — ABIRATERONE ACETATE 250 MG: 250 | 30 days supply | Qty: 120 | Fill #0

## 2019-02-22 NOTE — Telephone Encounter (Signed)
Oral Chemotherapy Pharmacist Encounter  We were finally able to get a hold of Anthony Villanueva. [place] will delivery his medication on 02/23/19. He knows to get started when he receives his medication.  Patient Education I spoke with patient for overview of new oral chemotherapy medication: Zytiga (abiraterone) for the treatment of castrate resistant metastatic prostate cancer in conjunction with ADT, planned duration until disease progression or unacceptable drug toxicity.  Counseled patient on administration, dosing, side effects, monitoring, drug-food interactions, safe handling, storage, and disposal. Patient will take 4 tablets (1,000 mg total) by mouth daily. Take on an empty stomach 1 hour before or 2 hours after a meal.  Side effects include but not limited to: HTN, decreased wbc, fatigue, hot flashes.    Reviewed with patient importance of keeping a medication schedule and plan for any missed doses.  Anthony Villanueva, Anthony Villanueva voiced understanding and appreciation. All questions answered. Medication handout placed in the mail.  Provided patient with Oral [facility] phone number. Patient knows to call the office with questions or concerns. Oral Chemotherapy [facility] will continue to follow.  Darl Pikes, PharmD, BCPS, [facility] Hematology/Oncology Clinical Pharmacist [facility]/HP/AP [facility] [phone]  02/22/2019 10:10 AM

## 2019-03-07 ENCOUNTER — Encounter (HOSPITAL_COMMUNITY): Payer: Self-pay | Admitting: Emergency Medicine

## 2019-03-07 ENCOUNTER — Emergency Department (HOSPITAL_COMMUNITY): Payer: Medicaid Other

## 2019-03-07 ENCOUNTER — Other Ambulatory Visit: Payer: Self-pay

## 2019-03-07 ENCOUNTER — Emergency Department (HOSPITAL_COMMUNITY)
Admission: EM | Admit: 2019-03-07 | Discharge: 2019-03-07 | Disposition: A | Discharge status: Home / Self Care | Payer: Medicaid Other | Attending: Emergency Medicine | Admitting: Emergency Medicine

## 2019-03-07 DIAGNOSIS — R630 Anorexia: Secondary | ICD-10-CM | POA: Diagnosis present

## 2019-03-07 DIAGNOSIS — F17210 Nicotine dependence, cigarettes, uncomplicated: Secondary | ICD-10-CM | POA: Insufficient documentation

## 2019-03-07 DIAGNOSIS — I10 Essential (primary) hypertension: Secondary | ICD-10-CM | POA: Insufficient documentation

## 2019-03-07 DIAGNOSIS — E860 Dehydration: Secondary | ICD-10-CM | POA: Diagnosis not present

## 2019-03-07 DIAGNOSIS — C7951 Secondary malignant neoplasm of bone: Secondary | ICD-10-CM | POA: Diagnosis not present

## 2019-03-07 DIAGNOSIS — Z79899 Other long term (current) drug therapy: Secondary | ICD-10-CM | POA: Insufficient documentation

## 2019-03-07 DIAGNOSIS — C61 Malignant neoplasm of prostate: Secondary | ICD-10-CM | POA: Diagnosis not present

## 2019-03-07 LAB — COMPREHENSIVE METABOLIC PANEL
ALT: 8 U/L (ref 0–44)
AST: 13 U/L — ABNORMAL LOW (ref 15–41)
Albumin: 2.7 g/dL — ABNORMAL LOW (ref 3.5–5.0)
Alkaline Phosphatase: 80 U/L (ref 38–126)
Anion gap: 9 (ref 5–15)
BUN: 16 mg/dL (ref 8–23)
CO2: 25 mmol/L (ref 22–32)
Calcium: 8.5 mg/dL — ABNORMAL LOW (ref 8.9–10.3)
Chloride: 101 mmol/L (ref 98–111)
Creatinine, Ser: 1.45 mg/dL — ABNORMAL HIGH (ref 0.61–1.24)
GFR calc Af Amer: 60 mL/min — ABNORMAL LOW (ref >60)
GFR calc non Af Amer: 52 mL/min — ABNORMAL LOW (ref >60)
Glucose, Bld: 86 mg/dL (ref 70–99)
Potassium: 4.2 mmol/L (ref 3.5–5.1)
Sodium: 135 mmol/L (ref 135–145)
Total Bilirubin: 0.6 mg/dL (ref 0.3–1.2)
Total Protein: 6.5 g/dL (ref 6.5–8.1)

## 2019-03-07 LAB — CBC WITH DIFFERENTIAL/PLATELET
Abs Immature Granulocytes: 0.02 10*3/uL (ref 0.00–0.07)
Basophils Absolute: 0 10*3/uL (ref 0.0–0.1)
Basophils Relative: 0 %
Eosinophils Absolute: 0 10*3/uL (ref 0.0–0.5)
Eosinophils Relative: 0 %
HCT: 25.3 % — ABNORMAL LOW (ref 39.0–52.0)
Hemoglobin: 8 g/dL — ABNORMAL LOW (ref 13.0–17.0)
Immature Granulocytes: 1 %
Lymphocytes Relative: 7 %
Lymphs Abs: 0.3 10*3/uL — ABNORMAL LOW (ref 0.7–4.0)
MCH: 29.1 pg (ref 26.0–34.0)
MCHC: 31.6 g/dL (ref 30.0–36.0)
MCV: 92 fL (ref 80.0–100.0)
Monocytes Absolute: 0.3 10*3/uL (ref 0.1–1.0)
Monocytes Relative: 8 %
Neutro Abs: 3 10*3/uL (ref 1.7–7.7)
Neutrophils Relative %: 84 %
Platelets: 356 10*3/uL (ref 150–400)
RBC: 2.75 MIL/uL — ABNORMAL LOW (ref 4.22–5.81)
RDW: 16.1 % — ABNORMAL HIGH (ref 11.5–15.5)
WBC: 3.6 10*3/uL — ABNORMAL LOW (ref 4.0–10.5)
nRBC: 0 % (ref 0.0–0.2)

## 2019-03-07 LAB — MAGNESIUM: Magnesium: 2 mg/dL (ref 1.7–2.4)

## 2019-03-07 MED ORDER — OXYCODONE HCL 5 MG PO TABS
15.0000 mg | ORAL_TABLET | Freq: Once | ORAL | Status: AC
  Administered 2019-03-07: 15 mg via ORAL
  Filled 2019-03-07: qty 3

## 2019-03-07 MED ORDER — SODIUM CHLORIDE 0.9 % IV BOLUS
1000.0000 mL | Freq: Once | INTRAVENOUS | Status: AC
  Administered 2019-03-07: 1000 mL via INTRAVENOUS

## 2019-03-07 NOTE — ED Notes (Signed)
Pt given urine specimen cup to provide sample. Pt stated he couldn't get the cup open; therefore, he did not provide the urine sample. RN aware.

## 2019-03-07 NOTE — ED Triage Notes (Signed)
Arrives C/C decreased appetite and weakness. Patient is a prostate CA pt, started new trt on the 14th of January. States "I smoke about 7-8 joints a day, and nothing. I bought an 18 dollar meal, some fried chicken and I didn't even want it when I got home." Denies SOB, fever, N/V/D.

## 2019-03-07 NOTE — ED Provider Notes (Signed)
[facility] DEPT Provider Note   CSN: [id number] Arrival date & time: 03/07/19  1216     History Chief Complaint  Patient presents with  . Failure To Thrive    Anthony Villanueva is a 64 y.o. male.  Pt presents to the ED today with decreased appetite.  The pt has a hx of metastatic prostate cancer to the bone that was diagnosed in April of 2019.  The pt had been getting Lupron (q4 mo) and Xtandi, but the [place] was changed to [place] in December.  He also received a dose of Lupron in December.  Pt said he last ate some cereal 3 days ago.  He has had a few glasses of tea, but nothing much.  He said he does not want anything.  Pt said he smoked 7-8 joints today to try to stimulate his appetite and it has not worked.  He bought a fried chicken meal, but he does not feel like eating it.        Past Medical History:  Diagnosis Date  . Bone metastases ([place])   . Cancer [facility])    prostate  . Childhood asthma    "no longer gives me any problems" 02/10/18  . Chronic diarrhea   . Chronic lower back pain   . Hypertension   . Loose stools   . Prostate cancer ([place])   . Rectal mass 10/27/2018  . Vitamin D deficiency     Patient Active Problem List   Diagnosis Date Noted  . Vitamin D deficiency 02/22/2019  . Other chronic pain 02/22/2019  . Prostate cancer metastatic to multiple sites ([place]) 11/21/2018  . Blood in stool 10/03/2018  . Chronic diarrhea 09/20/2018  . Essential hypertension 09/20/2018  . History of medication noncompliance 09/20/2018  . Bilateral hip pain 06/26/2018  . Loose stools 06/26/2018  . Prostate cancer ([place]) 05/19/2017  . Pelvic mass 05/10/2017  . Constipation 05/10/2017  . Weight loss, unintentional 05/10/2017  . Hematuria 05/10/2017    Past Surgical History:  Procedure Laterality Date  . MULTIPLE EXTRACTIONS WITH ALVEOLOPLASTY N/A 02/22/2018   Procedure: MULTIPLE EXTRACTION WITH ALVEOLOPLASTY;  Surgeon: Lenn Cal, DDS;   Location: [facility];  Service: Oral Surgery;  Laterality: N/A;  NASAL TUBE  . PROSTATE BIOPSY  05/11/2017   CT core bx L retroperitoneal LAN Archie Endo 05/11/2017       Family History  Problem Relation Age of Onset  . Cancer Mother        unknown type  . Stroke Brother   . Prostate cancer Neg Hx   . Colon cancer Neg Hx   . Breast cancer Neg Hx     Social History   Tobacco Use  . Smoking status: Current Some Day Smoker    Packs/day: 0.25    Years: 43.00    Pack years: 10.75    Types: Cigarettes    Last attempt to quit: 02/08/2018    Years since quitting: 1.0  . Smokeless tobacco: Never Used  Substance Use Topics  . Alcohol use: Yes    Comment: 02/10/2018 "2 - 40oz beer/day"  . Drug use: Yes    Frequency: 7.0 times per week    Types: Marijuana    Comment: daily use    Home Medications Prior to Admission medications   Medication Sig Start Date End Date Taking? Authorizing Provider  abiraterone acetate (ZYTIGA) 250 MG tablet Take 4 tablets (1,000 mg total) by mouth daily. Take on an empty stomach 1 hour  before or 2 hours after a meal Patient not taking: Reported on 02/20/2019 01/30/19   Wyatt Portela, MD  Acetaminophen 500 MG coapsule SMARTSIG:2 Capsule(s) By Mouth 3 Times Daily PRN 07/27/18   [provider]  buprenorphine (BUTRANS) 5 MCG/HR PTWK 1 patch once a week. 01/09/19   [provider]  gabapentin (NEURONTIN) 600 MG tablet TK 1 T PO TID 11/03/18   [provider]  hydrochlorothiazide (HYDRODIURIL) 25 MG tablet Take 1 tablet (25 mg total) by mouth daily. 12/20/18   Azzie Glatter, FNP  loperamide (IMODIUM A-D) 2 MG tablet Take 2 tablets (4 mg total) by mouth 4 (four) times daily as needed for diarrhea or loose stools. Patient not taking: Reported on 02/20/2019 12/20/18   Azzie Glatter, FNP  oxyCODONE (ROXICODONE) 15 MG immediate release tablet TK 1 T PO QID PRN 11/03/18   [provider]  Oxycodone HCl 20 MG TABS Take 1 tablet by mouth 4  (four) times daily as needed for pain. 02/12/19   [provider]  Vitamin D, Ergocalciferol, (DRISDOL) 1.25 MG (50000 UNIT) CAPS capsule Take 1 capsule (50,000 Units total) by mouth every 7 (seven) days. 02/20/19   Azzie Glatter, FNP    Allergies    Patient has no known allergies.  Review of Systems   Review of Systems  Constitutional: Positive for appetite change.  All other systems reviewed and are negative.   Physical Exam Updated Vital Signs BP 126/79 (BP Location: Left Arm)   Pulse 90   Temp 98 F (36.7 C) (Oral)   Resp 14   Ht 6' (1.829 m)   Wt 55.7 kg   SpO2 100%   BMI 16.65 kg/m   Physical Exam Vitals and nursing note reviewed.  Constitutional:      Appearance: He is underweight.  HENT:     Head: Normocephalic and atraumatic.     Right Ear: External ear normal.     Left Ear: External ear normal.     Nose: Nose normal.     Mouth/Throat:     Mouth: Mucous membranes are dry.     Pharynx: Oropharynx is clear.  Eyes:     Extraocular Movements: Extraocular movements intact.     Conjunctiva/sclera: Conjunctivae normal.     Pupils: Pupils are equal, round, and reactive to light.  Cardiovascular:     Rate and Rhythm: Regular rhythm. Tachycardia present.     Pulses: Normal pulses.     Heart sounds: Normal heart sounds.  Pulmonary:     Effort: Pulmonary effort is normal.     Breath sounds: Normal breath sounds.  Abdominal:     General: Abdomen is flat. Bowel sounds are normal.     Palpations: Abdomen is soft.  Musculoskeletal:        General: Normal range of motion.     Cervical back: Normal range of motion and neck supple.  Skin:    General: Skin is warm.     Capillary Refill: Capillary refill takes less than 2 seconds.  Neurological:     General: No focal deficit present.     Mental Status: He is alert and oriented to person, place, and time.  Psychiatric:        Mood and Affect: Mood normal.        Behavior: Behavior normal.        Thought  Content: Thought content normal.        Judgment: Judgment normal.  ED Results / Procedures / Treatments   Labs (all labs ordered are listed, but only abnormal results are displayed) Labs Reviewed  COMPREHENSIVE METABOLIC PANEL - Abnormal; Notable for the following components:      Result Value   Creatinine, Ser 1.45 (*)    Calcium 8.5 (*)    Albumin 2.7 (*)    AST 13 (*)    GFR calc non Af Amer 52 (*)    GFR calc Af Amer 60 (*)    All other components within normal limits  CBC WITH DIFFERENTIAL/PLATELET - Abnormal; Notable for the following components:   WBC 3.6 (*)    RBC 2.75 (*)    Hemoglobin 8.0 (*)    HCT 25.3 (*)    RDW 16.1 (*)    Lymphs Abs 0.3 (*)    All other components within normal limits  MAGNESIUM  URINALYSIS, ROUTINE W REFLEX MICROSCOPIC    EKG EKG Interpretation  Date/Time:  Wednesday March 07 2019 12:57:57 EST Ventricular Rate:  95 PR Interval:    QRS Duration: 92 QT Interval:  353 QTC Calculation: 444 R Axis:   81 Text Interpretation: Sinus rhythm Right atrial enlargement Borderline right axis deviation Minimal ST elevation, anterior leads No old tracing to compare Confirmed by Isla Pence [phone]) on 03/07/2019 1:01:47 PM   Radiology DG Chest 2 View  Result Date: 03/07/2019 CLINICAL DATA:  Prostate carcinoma.  Shortness of breath EXAM: CHEST - 2 VIEW COMPARISON:  Nuclear medicine bone scan October 09, 2018 FINDINGS: Lungs are clear. Heart size and pulmonary vascularity are normal. No adenopathy. There is overall relative sclerosis at multiple sites in the thoracic region. IMPRESSION: Findings felt to represent bony metastatic disease from prostate carcinoma. Lungs clear. No adenopathy. Cardiac silhouette normal. Electronically Signed   By: Lowella Grip III M.D.   On: 03/07/2019 13:42    Procedures Procedures (including critical care time)  Medications Ordered in ED Medications  sodium chloride 0.9 % bolus 1,000 mL (1,000 mLs  Intravenous New Bag/Given 03/07/19 1308)  sodium chloride 0.9 % bolus 1,000 mL (1,000 mLs Intravenous New Bag/Given 03/07/19 1500)  oxyCODONE (Oxy IR/ROXICODONE) immediate release tablet 15 mg (15 mg Oral Given 03/07/19 1500)    ED Course  I have reviewed the triage vital signs and the nursing notes.  Pertinent labs & imaging results that were available during my care of the patient were reviewed by me and considered in my medical decision making (see chart for details).    MDM Rules/Calculators/A&P                     Pt is feeling better after 2L NS.  The pt is able to tolerate po fluids.  He knows to return if worse.  F/u with pcp and with Dr. Alen Blew.  Final Clinical Impression(s) / ED Diagnoses Final diagnoses:  Dehydration  Prostate cancer metastatic to bone [facility])    Rx / DC Orders ED Discharge Orders    None       Isla Pence, MD 03/07/19 1525

## 2019-03-21 ENCOUNTER — Other Ambulatory Visit: Payer: Self-pay | Admitting: Oncology

## 2019-03-22 MED FILL — ABIRATERONE ACETATE 250 MG: 250 | 30 days supply | Qty: 120 | Fill #0

## 2019-04-04 ENCOUNTER — Other Ambulatory Visit: Payer: Medicaid Other

## 2019-04-04 ENCOUNTER — Ambulatory Visit: Payer: Medicaid Other | Admitting: Oncology

## 2019-04-11 ENCOUNTER — Other Ambulatory Visit: Payer: Self-pay | Admitting: Oncology

## 2019-04-16 ENCOUNTER — Other Ambulatory Visit: Payer: Self-pay

## 2019-04-16 ENCOUNTER — Encounter (HOSPITAL_COMMUNITY): Payer: Self-pay

## 2019-04-16 ENCOUNTER — Inpatient Hospital Stay (HOSPITAL_COMMUNITY)
Admission: EM | Admit: 2019-04-16 | Discharge: 2019-04-20 | DRG: 640 | Disposition: A | Discharge status: Hospice | Payer: Medicaid Other | Attending: Internal Medicine | Admitting: Internal Medicine

## 2019-04-16 DIAGNOSIS — T68XXXA Hypothermia, initial encounter: Secondary | ICD-10-CM | POA: Diagnosis present

## 2019-04-16 DIAGNOSIS — F17210 Nicotine dependence, cigarettes, uncomplicated: Secondary | ICD-10-CM | POA: Diagnosis present

## 2019-04-16 DIAGNOSIS — R188 Other ascites: Secondary | ICD-10-CM | POA: Diagnosis present

## 2019-04-16 DIAGNOSIS — C61 Malignant neoplasm of prostate: Secondary | ICD-10-CM | POA: Diagnosis present

## 2019-04-16 DIAGNOSIS — D630 Anemia in neoplastic disease: Secondary | ICD-10-CM | POA: Diagnosis present

## 2019-04-16 DIAGNOSIS — N1330 Unspecified hydronephrosis: Secondary | ICD-10-CM | POA: Diagnosis present

## 2019-04-16 DIAGNOSIS — Z809 Family history of malignant neoplasm, unspecified: Secondary | ICD-10-CM

## 2019-04-16 DIAGNOSIS — E43 Unspecified severe protein-calorie malnutrition: Secondary | ICD-10-CM | POA: Diagnosis present

## 2019-04-16 DIAGNOSIS — E559 Vitamin D deficiency, unspecified: Secondary | ICD-10-CM | POA: Diagnosis present

## 2019-04-16 DIAGNOSIS — R627 Adult failure to thrive: Secondary | ICD-10-CM

## 2019-04-16 DIAGNOSIS — K529 Noninfective gastroenteritis and colitis, unspecified: Secondary | ICD-10-CM | POA: Diagnosis present

## 2019-04-16 DIAGNOSIS — C799 Secondary malignant neoplasm of unspecified site: Secondary | ICD-10-CM | POA: Diagnosis present

## 2019-04-16 DIAGNOSIS — L8990 Pressure ulcer of unspecified site, unspecified stage: Secondary | ICD-10-CM | POA: Diagnosis present

## 2019-04-16 DIAGNOSIS — R599 Enlarged lymph nodes, unspecified: Secondary | ICD-10-CM | POA: Diagnosis present

## 2019-04-16 DIAGNOSIS — I10 Essential (primary) hypertension: Secondary | ICD-10-CM | POA: Diagnosis present

## 2019-04-16 DIAGNOSIS — Z79899 Other long term (current) drug therapy: Secondary | ICD-10-CM

## 2019-04-16 DIAGNOSIS — Z681 Body mass index (BMI) 19 or less, adult: Secondary | ICD-10-CM

## 2019-04-16 DIAGNOSIS — C7951 Secondary malignant neoplasm of bone: Secondary | ICD-10-CM | POA: Diagnosis present

## 2019-04-16 DIAGNOSIS — F1290 Cannabis use, unspecified, uncomplicated: Secondary | ICD-10-CM | POA: Diagnosis present

## 2019-04-16 DIAGNOSIS — Z66 Do not resuscitate: Secondary | ICD-10-CM | POA: Diagnosis present

## 2019-04-16 DIAGNOSIS — E162 Hypoglycemia, unspecified: Principal | ICD-10-CM | POA: Diagnosis present

## 2019-04-16 DIAGNOSIS — R5381 Other malaise: Secondary | ICD-10-CM | POA: Diagnosis present

## 2019-04-16 DIAGNOSIS — Z20822 Contact with and (suspected) exposure to covid-19: Secondary | ICD-10-CM | POA: Diagnosis present

## 2019-04-16 LAB — CBG MONITORING, ED: Glucose-Capillary: 80 mg/dL (ref 70–99)

## 2019-04-16 MED ORDER — SODIUM CHLORIDE 0.9% FLUSH: 3.0000 mL | INTRAVENOUS | Status: DC | PRN

## 2019-04-16 MED ORDER — SODIUM CHLORIDE 0.9 % IV SOLN: 250.0000 mL | INTRAVENOUS | Status: DC | PRN

## 2019-04-16 MED ORDER — SODIUM CHLORIDE 0.9% FLUSH: 3.0000 mL | Freq: Two times a day (BID) | INTRAVENOUS | Status: DC

## 2019-04-16 MED ORDER — ONDANSETRON HCL 4 MG/2ML IJ SOLN
4.0000 mg | Freq: Once | INTRAMUSCULAR | Status: AC
  Administered 2019-04-17: 4 mg via INTRAVENOUS
  Filled 2019-04-16: qty 2

## 2019-04-16 MED ORDER — DEXTROSE 50 % IV SOLN
1.0000 | INTRAVENOUS | Status: DC | PRN
  Administered 2019-04-17 – 2019-04-19 (×2): 50 mL via INTRAVENOUS
  Filled 2019-04-16 (×3): qty 50

## 2019-04-16 NOTE — ED Notes (Signed)
Pt placed on bairhugger and covered with warm blankets.

## 2019-04-16 NOTE — ED Provider Notes (Signed)
[facility] DEPT Provider Note  CSN: [id number] Arrival date & time: 04/16/19 2303  Chief Complaint(s) Hypoglycemia (Coming from home. Roommate called  ems with c/o generalized weakness and not eating or drinking much in 8 days. )  HPI Kordae Bensman is a 64 y.o. male with a past medical history listed below including metastatic prostate cancer who presents to the emergency department with hypoglycemia.  EMS was called out by patient's roommate/brother who reported generalized fatigue and decreased oral intake for 8 days.  Patient reports that he does not have an appetite.  States that he smokes weed "like a Champ."  His wife."  Patient denies any alcohol consumption.  Reports chronic diarrhea for several days.  Denies any nausea or vomiting.  No fevers or chills.  No cough or congestion.  No chest pain or shortness of breath.  Patient without history of diabetes.  EMS reported a CBG of 40.  Patient given amp of D50 and placed on D10 drip.  On arrival here patient's CBG of 80.  HPI  Past Medical History Past Medical History:  Diagnosis Date  . Bone metastases ([place])   . Cancer [facility])    prostate  . Childhood asthma    "no longer gives me any problems" 02/10/18  . Chronic diarrhea   . Chronic lower back pain   . Hypertension   . Loose stools   . Prostate cancer ([place])   . Rectal mass 10/27/2018  . Vitamin D deficiency    Patient Active Problem List   Diagnosis Date Noted  . Hypoglycemia 04/17/2019  . Hypothermia 04/17/2019  . Severe protein-calorie malnutrition ([place]) 04/17/2019  . Vitamin D deficiency 02/22/2019  . Other chronic pain 02/22/2019  . Prostate cancer metastatic to multiple sites ([place]) 11/21/2018  . Blood in stool 10/03/2018  . Chronic diarrhea 09/20/2018  . Essential hypertension 09/20/2018  . History of medication noncompliance 09/20/2018  . Bilateral hip pain 06/26/2018  . Loose stools 06/26/2018  . Prostate cancer ([place]) 05/19/2017  .  Pelvic mass 05/10/2017  . Constipation 05/10/2017  . Weight loss, unintentional 05/10/2017  . Hematuria 05/10/2017   Home Medication(s) Prior to Admission medications   Medication Sig Start Date End Date Taking? Authorizing Provider  abiraterone acetate (ZYTIGA) 250 MG tablet TAKE 4 TABLETS (1,000 MG TOTAL) BY MOUTH DAILY. TAKE ON AN EMPTY STOMACH 1 HOUR BEFORE OR 2 HOURS AFTER A MEAL 04/11/19  Yes Wyatt Portela, MD  Acetaminophen 500 MG coapsule Take 500 mg by mouth 3 (three) times daily as needed for pain.  07/27/18  Yes [provider]  gabapentin (NEURONTIN) 600 MG tablet Take 600 mg by mouth 3 (three) times daily.  11/03/18  Yes [provider]  Oxycodone HCl 20 MG TABS Take 1 tablet by mouth 4 (four) times daily as needed for pain. 02/12/19  Yes [provider]  buprenorphine (BUTRANS) 5 MCG/HR PTWK 1 patch once a week. 01/09/19   [provider]  hydrochlorothiazide (HYDRODIURIL) 25 MG tablet Take 1 tablet (25 mg total) by mouth daily. Patient not taking: Reported on 04/17/2019 12/20/18   Azzie Glatter, FNP  loperamide (IMODIUM A-D) 2 MG tablet Take 2 tablets (4 mg total) by mouth 4 (four) times daily as needed for diarrhea or loose stools. Patient not taking: Reported on 02/20/2019 12/20/18   Azzie Glatter, FNP  Vitamin D, Ergocalciferol, (DRISDOL) 1.25 MG (50000 UNIT) CAPS capsule Take 1 capsule (50,000 Units total) by mouth every 7 (seven) days.  Patient not taking: Reported on 04/17/2019 02/20/19   Azzie Glatter, FNP                                                                                                                                    Past Surgical History Past Surgical History:  Procedure Laterality Date  . MULTIPLE EXTRACTIONS WITH ALVEOLOPLASTY N/A 02/22/2018   Procedure: MULTIPLE EXTRACTION WITH ALVEOLOPLASTY;  Surgeon: Lenn Cal, DDS;  Location: [facility];  Service: Oral Surgery;  Laterality: N/A;  NASAL TUBE  . PROSTATE  BIOPSY  05/11/2017   CT core bx L retroperitoneal LAN Archie Endo 05/11/2017   Family History Family History  Problem Relation Age of Onset  . Cancer Mother        unknown type  . Stroke Brother   . Prostate cancer Neg Hx   . Colon cancer Neg Hx   . Breast cancer Neg Hx     Social History Social History   Tobacco Use  . Smoking status: Current Some Day Smoker    Packs/day: 0.25    Years: 43.00    Pack years: 10.75    Types: Cigarettes    Last attempt to quit: 02/08/2018    Years since quitting: 1.1  . Smokeless tobacco: Never Used  Substance Use Topics  . Alcohol use: Yes    Comment: 02/10/2018 "2 - 40oz beer/day"  . Drug use: Yes    Frequency: 7.0 times per week    Types: Marijuana    Comment: daily use   Allergies Patient has no known allergies.  Review of Systems Review of Systems All other systems are reviewed and are negative for acute change except as noted in the HPI  Physical Exam Vital Signs  I have reviewed the triage vital signs BP (!) 157/87 (BP Location: Right Arm)   Pulse 75   Temp (S) (!) 93.4 F (34.1 C) (Rectal)   Resp 16   Ht 6\' 1"  (1.854 m)   Wt 54 kg   SpO2 96%   BMI 15.70 kg/m   Physical Exam Vitals reviewed.  Constitutional:      General: He is not in acute distress.    Appearance: He is well-developed. He is cachectic. He is not diaphoretic.  HENT:     Head: Normocephalic and atraumatic.     Jaw: No trismus.     Right Ear: External ear normal.     Left Ear: External ear normal.     Nose: Nose normal.  Eyes:     General: No scleral icterus.    Conjunctiva/sclera: Conjunctivae normal.  Neck:     Trachea: Phonation normal.  Cardiovascular:     Rate and Rhythm: Normal rate and regular rhythm.  Pulmonary:     Effort: Pulmonary effort is normal. No respiratory distress.     Breath sounds: No stridor.  Abdominal:     General: There is no distension.  Musculoskeletal:        General: Normal range of motion.     Cervical back: Normal  range of motion.  Neurological:     Mental Status: He is alert and oriented to person, place, and time.  Psychiatric:        Behavior: Behavior normal.     ED Results and Treatments Labs (all labs ordered are listed, but only abnormal results are displayed) Labs Reviewed  COMPREHENSIVE METABOLIC PANEL - Abnormal; Notable for the following components:      Result Value   Sodium 128 (*)    Potassium 3.0 (*)    CO2 19 (*)    BUN 64 (*)    Calcium 7.2 (*)    Total Protein 5.1 (*)    Albumin 2.2 (*)    AST 105 (*)    ALT 50 (*)    All other components within normal limits  CBC WITH DIFFERENTIAL/PLATELET - Abnormal; Notable for the following components:   RBC 2.86 (*)    Hemoglobin 8.3 (*)    HCT 25.0 (*)    RDW 16.5 (*)    Lymphs Abs 0.3 (*)    Abs Immature Granulocytes 0.24 (*)    All other components within normal limits  URINALYSIS, ROUTINE W REFLEX MICROSCOPIC - Abnormal; Notable for the following components:   Hgb urine dipstick MODERATE (*)    All other components within normal limits  LIPASE, BLOOD - Abnormal; Notable for the following components:   Lipase 145 (*)    All other components within normal limits  COMPREHENSIVE METABOLIC PANEL - Abnormal; Notable for the following components:   Sodium 128 (*)    CO2 17 (*)    Glucose, Bld 154 (*)    BUN 60 (*)    Calcium 7.0 (*)    Total Protein 4.4 (*)    Albumin 1.8 (*)    AST 66 (*)    All other components within normal limits  CBC WITH DIFFERENTIAL/PLATELET - Abnormal; Notable for the following components:   RBC 2.72 (*)    Hemoglobin 7.8 (*)    HCT 23.6 (*)    RDW 16.6 (*)    Lymphs Abs 0.3 (*)    Abs Immature Granulocytes 0.13 (*)    All other components within normal limits  CBG MONITORING, ED - Abnormal; Notable for the following components:   Glucose-Capillary 29 (*)    All other components within normal limits  CBG MONITORING, ED - Abnormal; Notable for the following components:   Glucose-Capillary  25 (*)    All other components within normal limits  CBG MONITORING, ED - Abnormal; Notable for the following components:   Glucose-Capillary 113 (*)    All other components within normal limits  CBG MONITORING, ED - Abnormal; Notable for the following components:   Glucose-Capillary 165 (*)    All other components within normal limits  URINE CULTURE  SARS CORONAVIRUS 2 (TAT 6-24 HRS)  ETHANOL  HIV ANTIBODY (ROUTINE TESTING W REFLEX)  CBC  CORTISOL  TSH  CBG MONITORING, ED  CBG MONITORING, ED  CBG MONITORING, ED  EKG  EKG Interpretation  Date/Time:  Monday April 16 2019 23:48:40 EST Ventricular Rate:  63 PR Interval:    QRS Duration: 96 QT Interval:  496 QTC Calculation: 508 R Axis:   84 Text Interpretation: Sinus rhythm Borderline right axis deviation Anteroseptal infarct, old Prolonged QT interval Otherwise no significant change Confirmed by Addison Lank [phone]) on 04/17/2019 12:08:49 AM      Radiology CT ABDOMEN PELVIS W CONTRAST  Result Date: 04/17/2019 CLINICAL DATA:  Pancreatitis. History of prostate cancer with bone Mets. EXAM: CT ABDOMEN AND PELVIS WITH CONTRAST TECHNIQUE: Multidetector CT imaging of the abdomen and pelvis was performed using the standard protocol following bolus administration of intravenous contrast. CONTRAST:  62mL OMNIPAQUE IOHEXOL 300 MG/ML  SOLN COMPARISON:  A 03/09/2018 FINDINGS: Lower chest: There is a small left-sided pleural effusion.The heart size is normal. Hepatobiliary: The liver is normal. Normal gallbladder.There is no biliary ductal dilation. Pancreas: Normal contours without ductal dilatation. No peripancreatic fluid collection. Spleen: There is a new heterogeneous mass in the spleen measuring approximately 2 cm. This is concerning for a new metastatic lesion. Adrenals/Urinary Tract: --Adrenal glands: There appears to be a  mass in the left adrenal gland measuring approximately 1.6 cm. --Right kidney/ureter: There is mild to moderate right-sided hydroureteronephrosis. --Left kidney/ureter: There is moderate severe left-sided hydronephrosis. There appears to be extensive soft tissue involving the mid to distal left ureter. --Urinary bladder: There is diffuse bladder wall thickening. There is probable invasion of the urinary bladder from the patient's rectal mass. Stomach/Bowel: --Stomach/Duodenum: No hiatal hernia or other gastric abnormality. Normal duodenal course and caliber. --Small bowel: No dilatation or inflammation. --Colon: There is diffuse wall thickening of the rectum and sigmoid colon. There are findings suspicious for an underlying rectal mass. --Appendix: Normal. Vascular/Lymphatic: Normal course and caliber of the major abdominal vessels. --pathologically enlarged retroperitoneal lymph nodes are noted. The --No mesenteric lymphadenopathy. --pathologically enlarged pelvic lymph nodes are noted. Reproductive: Unremarkable Other: There is a virtual complete lack of intra-abdominal fat. There is likely a moderate to large volume of abdominal ascites, however evaluation is limited by lack of abdominal fat. There is no free air. There is a cystic mass in the patient's mid abdomen measuring approximately 7 x 3.5 cm, favored to represent an area of loculated ascites. Other differential considerations include a large necrotic lymph node. Musculoskeletal. Extensive mixed sclerotic and lytic metastatic lesions are noted throughout lumbar spine and pelvis. For example, there is progressive destruction of the L4 vertebral body. These have progressed since the prior study. There is no likely spinal canal stenosis at the L3-L4 and L4-L5 levels secondary to metastatic disease. IMPRESSION: 1. Overall findings consistent with worsening metastatic disease as detailed above. 2. New bilateral hydronephrosis, left worse than right. On the  left, the hydronephrosis appears to be secondary to soft tissue involvement of the left ureter. On the right, the hydronephrosis may be secondary to retroperitoneal adenopathy or the patient's infiltrative bladder mass. 3. Persistent diffuse wall thickening of the rectum, improved from prior study. There is probable invasion of the posterior bladder wall as before. 4. Probable new intra-abdominal ascites as detailed above. Electronically Signed   By: Constance Holster M.D.   On: 04/17/2019 02:05    Pertinent labs & imaging results that were available during my care of the patient were reviewed by me and considered in my medical decision making (see chart for details).  Medications Ordered in ED Medications  dextrose 50 % solution 50 mL (50 mLs Intravenous  Given 04/17/19 0111)  dextrose 10 % 1,000 mL with sodium chloride 0.45 %, potassium chloride 40 mEq/L infusion ( Intravenous New Bag/Given 04/17/19 0200)  sodium chloride (PF) 0.9 % injection (  Not Given 04/17/19 0204)  acetaminophen (TYLENOL) tablet 650 mg (650 mg Oral Given 04/17/19 0512)    Or  acetaminophen (TYLENOL) suppository 650 mg ( Rectal See Alternative 04/17/19 0512)  ondansetron (ZOFRAN) tablet 4 mg (has no administration in time range)    Or  ondansetron (ZOFRAN) injection 4 mg (has no administration in time range)  enoxaparin (LOVENOX) injection 40 mg (has no administration in time range)  ondansetron (ZOFRAN) injection 4 mg (4 mg Intravenous Given 04/17/19 0012)  iohexol (OMNIPAQUE) 300 MG/ML solution 100 mL (80 mLs Intravenous Contrast Given 04/17/19 0130)                                                                                                                                    Procedures .Critical Care Performed by: Fatima Blank, MD Authorized by: Fatima Blank, MD    CRITICAL CARE Performed by: Grayce Sessions Hiep Ollis Total critical care time: 55 minutes Critical care time was exclusive of separately  billable procedures and treating other patients. Critical care was necessary to treat or prevent imminent or life-threatening deterioration. Critical care was time spent personally by me on the following activities: development of treatment plan with patient and/or surrogate as well as nursing, discussions with consultants, evaluation of patient's response to treatment, examination of patient, obtaining history from patient or surrogate, ordering and performing treatments and interventions, ordering and review of laboratory studies, ordering and review of radiographic studies, pulse oximetry and re-evaluation of patient's condition.    (including critical care time)  Medical Decision Making / ED Course I have reviewed the nursing notes for this encounter and the patient's prior records (if available in EHR or on provided paperwork).   Deanthony Dize was evaluated in Emergency Department on 04/17/2019 for the symptoms described in the history of present illness. He was evaluated in the context of the global COVID-19 pandemic, which necessitated consideration that the patient might be at risk for infection with the SARS-CoV-2 virus that causes COVID-19. Institutional protocols and algorithms that pertain to the evaluation of patients at risk for COVID-19 are in a state of rapid change based on information released by regulatory bodies including the CDC and federal and state organizations. These policies and algorithms were followed during the patient's care in the ED.  Hypoglycemia in the setting of decreased oral intake. No diabetic medication. Patient denies alcohol use. Patient noted to be hypothermic.  Bear hugger applied.  Labs notable for evidence of severe failure to thrive.  Patient required multiple doses of D50 and placed on D10 drip.  CT of the abdomen obtain due to elevated lipase and known history of cancer which revealed diffuse metastatic cancer involving the mesentery.   Admitted to  medicine for continued management.      Final Clinical Impression(s) / ED Diagnoses Final diagnoses:  Failure to thrive in adult  Hypoglycemia  Prostate cancer metastatic to multiple sites [facility])      This chart was dictated using voice recognition software.  Despite best efforts to proofread,  errors can occur which can change the documentation meaning.   Fatima Blank, MD 04/17/19 [phone]

## 2019-04-16 NOTE — ED Triage Notes (Addendum)
Coming from home. Roommate called  ems with c/o generalized weakness and not eating or drinking much in 8 days. Hx prostate  Cancer and gets treatment every 4 months.   VS: bp  180/100 Hr   60 spo2   n/a rr   cbg   30 amp D50 and 75cc D10

## 2019-04-17 ENCOUNTER — Encounter (HOSPITAL_COMMUNITY): Payer: Self-pay | Admitting: Internal Medicine

## 2019-04-17 ENCOUNTER — Emergency Department (HOSPITAL_COMMUNITY): Payer: Medicaid Other

## 2019-04-17 DIAGNOSIS — E43 Unspecified severe protein-calorie malnutrition: Secondary | ICD-10-CM | POA: Diagnosis present

## 2019-04-17 DIAGNOSIS — R188 Other ascites: Secondary | ICD-10-CM | POA: Diagnosis present

## 2019-04-17 DIAGNOSIS — R531 Weakness: Secondary | ICD-10-CM | POA: Diagnosis not present

## 2019-04-17 DIAGNOSIS — I10 Essential (primary) hypertension: Secondary | ICD-10-CM | POA: Diagnosis present

## 2019-04-17 DIAGNOSIS — C7951 Secondary malignant neoplasm of bone: Secondary | ICD-10-CM | POA: Diagnosis present

## 2019-04-17 DIAGNOSIS — R5381 Other malaise: Secondary | ICD-10-CM | POA: Diagnosis present

## 2019-04-17 DIAGNOSIS — Z515 Encounter for palliative care: Secondary | ICD-10-CM | POA: Diagnosis not present

## 2019-04-17 DIAGNOSIS — Z681 Body mass index (BMI) 19 or less, adult: Secondary | ICD-10-CM | POA: Diagnosis not present

## 2019-04-17 DIAGNOSIS — R599 Enlarged lymph nodes, unspecified: Secondary | ICD-10-CM | POA: Diagnosis present

## 2019-04-17 DIAGNOSIS — T68XXXA Hypothermia, initial encounter: Secondary | ICD-10-CM

## 2019-04-17 DIAGNOSIS — Z79899 Other long term (current) drug therapy: Secondary | ICD-10-CM | POA: Diagnosis not present

## 2019-04-17 DIAGNOSIS — Z66 Do not resuscitate: Secondary | ICD-10-CM | POA: Diagnosis present

## 2019-04-17 DIAGNOSIS — C61 Malignant neoplasm of prostate: Secondary | ICD-10-CM | POA: Diagnosis present

## 2019-04-17 DIAGNOSIS — C799 Secondary malignant neoplasm of unspecified site: Secondary | ICD-10-CM | POA: Diagnosis present

## 2019-04-17 DIAGNOSIS — E559 Vitamin D deficiency, unspecified: Secondary | ICD-10-CM | POA: Diagnosis present

## 2019-04-17 DIAGNOSIS — Z20822 Contact with and (suspected) exposure to covid-19: Secondary | ICD-10-CM | POA: Diagnosis present

## 2019-04-17 DIAGNOSIS — K529 Noninfective gastroenteritis and colitis, unspecified: Secondary | ICD-10-CM | POA: Diagnosis present

## 2019-04-17 DIAGNOSIS — F1290 Cannabis use, unspecified, uncomplicated: Secondary | ICD-10-CM | POA: Diagnosis present

## 2019-04-17 DIAGNOSIS — Z7189 Other specified counseling: Secondary | ICD-10-CM | POA: Diagnosis not present

## 2019-04-17 DIAGNOSIS — D630 Anemia in neoplastic disease: Secondary | ICD-10-CM | POA: Diagnosis present

## 2019-04-17 DIAGNOSIS — F17210 Nicotine dependence, cigarettes, uncomplicated: Secondary | ICD-10-CM | POA: Diagnosis present

## 2019-04-17 DIAGNOSIS — E162 Hypoglycemia, unspecified: Secondary | ICD-10-CM | POA: Diagnosis present

## 2019-04-17 DIAGNOSIS — Z809 Family history of malignant neoplasm, unspecified: Secondary | ICD-10-CM | POA: Diagnosis not present

## 2019-04-17 DIAGNOSIS — R627 Adult failure to thrive: Secondary | ICD-10-CM | POA: Diagnosis present

## 2019-04-17 DIAGNOSIS — N1330 Unspecified hydronephrosis: Secondary | ICD-10-CM | POA: Diagnosis present

## 2019-04-17 LAB — CBC WITH DIFFERENTIAL/PLATELET
Abs Immature Granulocytes: 0.13 10*3/uL — ABNORMAL HIGH (ref 0.00–0.07)
Abs Immature Granulocytes: 0.24 10*3/uL — ABNORMAL HIGH (ref 0.00–0.07)
Basophils Absolute: 0 10*3/uL (ref 0.0–0.1)
Basophils Absolute: 0 10*3/uL (ref 0.0–0.1)
Basophils Relative: 0 %
Basophils Relative: 0 %
Eosinophils Absolute: 0 10*3/uL (ref 0.0–0.5)
Eosinophils Absolute: 0 10*3/uL (ref 0.0–0.5)
Eosinophils Relative: 0 %
Eosinophils Relative: 0 %
HCT: 23.6 % — ABNORMAL LOW (ref 39.0–52.0)
HCT: 25 % — ABNORMAL LOW (ref 39.0–52.0)
Hemoglobin: 7.8 g/dL — ABNORMAL LOW (ref 13.0–17.0)
Hemoglobin: 8.3 g/dL — ABNORMAL LOW (ref 13.0–17.0)
Immature Granulocytes: 2 %
Immature Granulocytes: 5 %
Lymphocytes Relative: 5 %
Lymphocytes Relative: 6 %
Lymphs Abs: 0.3 10*3/uL — ABNORMAL LOW (ref 0.7–4.0)
Lymphs Abs: 0.3 10*3/uL — ABNORMAL LOW (ref 0.7–4.0)
MCH: 28.7 pg (ref 26.0–34.0)
MCH: 29 pg (ref 26.0–34.0)
MCHC: 33.1 g/dL (ref 30.0–36.0)
MCHC: 33.2 g/dL (ref 30.0–36.0)
MCV: 86.8 fL (ref 80.0–100.0)
MCV: 87.4 fL (ref 80.0–100.0)
Monocytes Absolute: 0.2 10*3/uL (ref 0.1–1.0)
Monocytes Absolute: 0.3 10*3/uL (ref 0.1–1.0)
Monocytes Relative: 5 %
Monocytes Relative: 6 %
Neutro Abs: 4 10*3/uL (ref 1.7–7.7)
Neutro Abs: 4.8 10*3/uL (ref 1.7–7.7)
Neutrophils Relative %: 84 %
Neutrophils Relative %: 87 %
Platelets: 239 10*3/uL (ref 150–400)
Platelets: 276 10*3/uL (ref 150–400)
RBC: 2.72 MIL/uL — ABNORMAL LOW (ref 4.22–5.81)
RBC: 2.86 MIL/uL — ABNORMAL LOW (ref 4.22–5.81)
RDW: 16.5 % — ABNORMAL HIGH (ref 11.5–15.5)
RDW: 16.6 % — ABNORMAL HIGH (ref 11.5–15.5)
WBC: 4.8 10*3/uL (ref 4.0–10.5)
WBC: 5.5 10*3/uL (ref 4.0–10.5)
nRBC: 0 % (ref 0.0–0.2)
nRBC: 0 % (ref 0.0–0.2)

## 2019-04-17 LAB — CBG MONITORING, ED
Glucose-Capillary: 101 mg/dL — ABNORMAL HIGH (ref 70–99)
Glucose-Capillary: 104 mg/dL — ABNORMAL HIGH (ref 70–99)
Glucose-Capillary: 110 mg/dL — ABNORMAL HIGH (ref 70–99)
Glucose-Capillary: 113 mg/dL — ABNORMAL HIGH (ref 70–99)
Glucose-Capillary: 116 mg/dL — ABNORMAL HIGH (ref 70–99)
Glucose-Capillary: 120 mg/dL — ABNORMAL HIGH (ref 70–99)
Glucose-Capillary: 136 mg/dL — ABNORMAL HIGH (ref 70–99)
Glucose-Capillary: 165 mg/dL — ABNORMAL HIGH (ref 70–99)
Glucose-Capillary: 25 mg/dL — CL (ref 70–99)
Glucose-Capillary: 29 mg/dL — CL (ref 70–99)
Glucose-Capillary: 70 mg/dL (ref 70–99)
Glucose-Capillary: 74 mg/dL (ref 70–99)
Glucose-Capillary: 77 mg/dL (ref 70–99)
Glucose-Capillary: 89 mg/dL (ref 70–99)
Glucose-Capillary: 89 mg/dL (ref 70–99)
Glucose-Capillary: 92 mg/dL (ref 70–99)
Glucose-Capillary: 94 mg/dL (ref 70–99)
Glucose-Capillary: 94 mg/dL (ref 70–99)
Glucose-Capillary: 97 mg/dL (ref 70–99)

## 2019-04-17 LAB — COMPREHENSIVE METABOLIC PANEL
ALT: 41 U/L (ref 0–44)
ALT: 50 U/L — ABNORMAL HIGH (ref 0–44)
AST: 105 U/L — ABNORMAL HIGH (ref 15–41)
AST: 66 U/L — ABNORMAL HIGH (ref 15–41)
Albumin: 1.8 g/dL — ABNORMAL LOW (ref 3.5–5.0)
Albumin: 2.2 g/dL — ABNORMAL LOW (ref 3.5–5.0)
Alkaline Phosphatase: 64 U/L (ref 38–126)
Alkaline Phosphatase: 73 U/L (ref 38–126)
Anion gap: 10 (ref 5–15)
Anion gap: 9 (ref 5–15)
BUN: 60 mg/dL — ABNORMAL HIGH (ref 8–23)
BUN: 64 mg/dL — ABNORMAL HIGH (ref 8–23)
CO2: 17 mmol/L — ABNORMAL LOW (ref 22–32)
CO2: 19 mmol/L — ABNORMAL LOW (ref 22–32)
Calcium: 7 mg/dL — ABNORMAL LOW (ref 8.9–10.3)
Calcium: 7.2 mg/dL — ABNORMAL LOW (ref 8.9–10.3)
Chloride: 102 mmol/L (ref 98–111)
Chloride: 99 mmol/L (ref 98–111)
Creatinine, Ser: 1.15 mg/dL (ref 0.61–1.24)
Creatinine, Ser: 1.21 mg/dL (ref 0.61–1.24)
GFR calc Af Amer: >60 mL/min (ref >60)
GFR calc Af Amer: >60 mL/min (ref >60)
GFR calc non Af Amer: >60 mL/min (ref >60)
GFR calc non Af Amer: >60 mL/min (ref >60)
Glucose, Bld: 154 mg/dL — ABNORMAL HIGH (ref 70–99)
Glucose, Bld: 89 mg/dL (ref 70–99)
Potassium: 3 mmol/L — ABNORMAL LOW (ref 3.5–5.1)
Potassium: 3.6 mmol/L (ref 3.5–5.1)
Sodium: 128 mmol/L — ABNORMAL LOW (ref 135–145)
Sodium: 128 mmol/L — ABNORMAL LOW (ref 135–145)
Total Bilirubin: 0.6 mg/dL (ref 0.3–1.2)
Total Bilirubin: 0.7 mg/dL (ref 0.3–1.2)
Total Protein: 4.4 g/dL — ABNORMAL LOW (ref 6.5–8.1)
Total Protein: 5.1 g/dL — ABNORMAL LOW (ref 6.5–8.1)

## 2019-04-17 LAB — URINE CULTURE: Culture: <10000 — AB

## 2019-04-17 LAB — URINALYSIS, ROUTINE W REFLEX MICROSCOPIC
Bacteria, UA: NONE SEEN
Bilirubin Urine: NEGATIVE
Glucose, UA: NEGATIVE mg/dL
Ketones, ur: NEGATIVE mg/dL
Leukocytes,Ua: NEGATIVE
Nitrite: NEGATIVE
Protein, ur: NEGATIVE mg/dL
Specific Gravity, Urine: 1.014 (ref 1.005–1.030)
pH: 5 (ref 5.0–8.0)

## 2019-04-17 LAB — SARS CORONAVIRUS 2 (TAT 6-24 HRS): SARS Coronavirus 2: NEGATIVE

## 2019-04-17 LAB — TSH: TSH: 1.839 u[IU]/mL (ref 0.350–4.500)

## 2019-04-17 LAB — ETHANOL: Alcohol, Ethyl (B): <10 mg/dL (ref <10)

## 2019-04-17 LAB — LIPASE, BLOOD: Lipase: 145 U/L — ABNORMAL HIGH (ref 11–51)

## 2019-04-17 LAB — HIV ANTIBODY (ROUTINE TESTING W REFLEX): HIV Screen 4th Generation wRfx: NONREACTIVE

## 2019-04-17 LAB — CORTISOL: Cortisol, Plasma: 21.5 ug/dL

## 2019-04-17 MED ORDER — ENOXAPARIN SODIUM 40 MG/0.4ML ~~LOC~~ SOLN
40.0000 mg | SUBCUTANEOUS | Status: DC
  Administered 2019-04-17 – 2019-04-19 (×3): 40 mg via SUBCUTANEOUS
  Filled 2019-04-17 (×3): qty 0.4

## 2019-04-17 MED ORDER — ACETAMINOPHEN 325 MG PO TABS
650.0000 mg | ORAL_TABLET | Freq: Four times a day (QID) | ORAL | Status: DC | PRN
  Administered 2019-04-17 (×2): 650 mg via ORAL
  Filled 2019-04-17 (×2): qty 2

## 2019-04-17 MED ORDER — OXYCODONE HCL 5 MG PO TABS
5.0000 mg | ORAL_TABLET | ORAL | Status: DC | PRN
  Administered 2019-04-18: 5 mg via ORAL
  Filled 2019-04-17: qty 1

## 2019-04-17 MED ORDER — SODIUM CHLORIDE (PF) 0.9 % IJ SOLN
INTRAMUSCULAR | Status: AC
  Filled 2019-04-17: qty 50

## 2019-04-17 MED ORDER — SODIUM CHLORIDE 4 MEQ/ML IV SOLN
INTRAVENOUS | Status: DC
  Filled 2019-04-17 (×6): qty 1000

## 2019-04-17 MED ORDER — IOHEXOL 300 MG/ML  SOLN
100.0000 mL | Freq: Once | INTRAMUSCULAR | Status: AC | PRN
  Administered 2019-04-17: 80 mL via INTRAVENOUS

## 2019-04-17 MED ORDER — ONDANSETRON HCL 4 MG/2ML IJ SOLN: 4.0000 mg | Freq: Four times a day (QID) | INTRAMUSCULAR | Status: DC | PRN

## 2019-04-17 MED ORDER — MORPHINE SULFATE (PF) 2 MG/ML IV SOLN
2.0000 mg | INTRAVENOUS | Status: DC | PRN
  Administered 2019-04-17 – 2019-04-20 (×10): 2 mg via INTRAVENOUS
  Filled 2019-04-17 (×10): qty 1

## 2019-04-17 MED ORDER — ACETAMINOPHEN 650 MG RE SUPP: 650.0000 mg | Freq: Four times a day (QID) | RECTAL | Status: DC | PRN

## 2019-04-17 MED ORDER — ONDANSETRON HCL 4 MG PO TABS: 4.0000 mg | ORAL_TABLET | Freq: Four times a day (QID) | ORAL | Status: DC | PRN

## 2019-04-17 NOTE — ED Notes (Signed)
Patient transported to CT 

## 2019-04-17 NOTE — ED Notes (Signed)
Anthony Villanueva, daughter, [phone] in [place], wants to know how critical dad is, if she should come home to see him. Please call and give update.

## 2019-04-17 NOTE — Progress Notes (Signed)
64 year old gentleman with history of metastatic prostate cancer anemia, debility, poor functional status brought to the hospital with severe debility, lethargy, patient's roommate called EMS.  Patient told that he has not been eating for the last more than a week. In the emergency room, he was hypothermic with temperature 93.7, blood sugars 29.  Other labs were remarkable.  CT scan of the abdomen pelvis shows new ascites, bilateral hydronephrosis, extensive metastatic disease. Patient seen and examined the emergency room.  He is waiting for inpatient bed availability. He was not very keen to conversation, he complained of leg pain and swelling.  Impression and plan: Metastatic prostate cancer Failure to thrive/severe physical debility/functional debility Severe protein calorie malnutrition Hypothermia and hypoglycemia without history of diabetes  Continue all supportive measures.  Bear hugger and dextrose infusion.  Will start patient on adequate pain medications, continue maintenance IV fluids. Called and discussed with patient's oncologist, patient will be seen and recommendations given. Given patient's advanced debilitated state and failure to thrive, patient is probably at end-of-life, he was not very open to discuss about it, palliative consult in place.  I called and updated patient's daughter Ms. Ermalene Searing, [phone] who is patient's elder daughter. She lives in [place] and was asking whether she should come here to see her father and asking whether he is in terminal stage. I explained to her that patient is in a poor clinical shape and advisable to visit him.  I updated her that patient will be seen by oncology and palliative medicine and will have multidisciplinary approach and will likely recommend hospice.

## 2019-04-17 NOTE — ED Notes (Signed)
Patient given breakfast tray.

## 2019-04-17 NOTE — H&P (Addendum)
History and Physical    Anthony Villanueva [id number] DOB: 11-14-1955 DOA: 04/16/2019  PCP: Azzie Glatter, FNP  Patient coming from: Home.  Chief Complaint: Weakness.  HPI: Anthony Villanueva is a 64 y.o. male with history of metastatic prostate cancer and anemia was found to be very weak and lethargic and patient's roommate called EMS.  Patient states he has not been eating well for last 8 days with some diarrhea.  Denies any vomiting or any abdominal pain.  Patient states he has been a poor appetite.  ED Course: In the ER patient was hypothermic with temperature of 93.7 F blood sugar was 29.  Hemoglobin is at baseline.  Patient was placed on Bair hugger started on D10 after D50 1 amp did not improve the blood sugar.  Patient admitted for further management of hyperglycemia in the setting of poor oral intake.  Labs reveal creatinine of 1.2 hemoglobin 8.3.  Since lipase is mildly elevated CT abdomen pelvis was done which shows new ascites and also new bilateral hydronephrosis and also shows metastatic disease.  UA shows some blood cells otherwise unremarkable and creatinine was normal.  Review of Systems: As per HPI, rest all negative.   Past Medical History:  Diagnosis Date  . Bone metastases ([place])   . Cancer [facility])    prostate  . Childhood asthma    "no longer gives me any problems" 02/10/18  . Chronic diarrhea   . Chronic lower back pain   . Hypertension   . Loose stools   . Prostate cancer ([place])   . Rectal mass 10/27/2018  . Vitamin D deficiency     Past Surgical History:  Procedure Laterality Date  . MULTIPLE EXTRACTIONS WITH ALVEOLOPLASTY N/A 02/22/2018   Procedure: MULTIPLE EXTRACTION WITH ALVEOLOPLASTY;  Surgeon: Lenn Cal, DDS;  Location: [facility];  Service: Oral Surgery;  Laterality: N/A;  NASAL TUBE  . PROSTATE BIOPSY  05/11/2017   CT core bx L retroperitoneal LAN /notes 05/11/2017     reports that he has been smoking cigarettes. He has a 10.75 pack-year smoking  history. He has never used smokeless tobacco. He reports current alcohol use. He reports current drug use. Frequency: 7.00 times per week. Drug: Marijuana.  No Known Allergies  Family History  Problem Relation Age of Onset  . Cancer Mother        unknown type  . Stroke Brother   . Prostate cancer Neg Hx   . Colon cancer Neg Hx   . Breast cancer Neg Hx     Prior to Admission medications   Medication Sig Start Date End Date Taking? Authorizing Provider  abiraterone acetate (ZYTIGA) 250 MG tablet TAKE 4 TABLETS (1,000 MG TOTAL) BY MOUTH DAILY. TAKE ON AN EMPTY STOMACH 1 HOUR BEFORE OR 2 HOURS AFTER A MEAL 04/11/19  Yes Wyatt Portela, MD  Acetaminophen 500 MG coapsule Take 500 mg by mouth 3 (three) times daily as needed for pain.  07/27/18  Yes [provider]  gabapentin (NEURONTIN) 600 MG tablet Take 600 mg by mouth 3 (three) times daily.  11/03/18  Yes [provider]  Oxycodone HCl 20 MG TABS Take 1 tablet by mouth 4 (four) times daily as needed for pain. 02/12/19  Yes [provider]  buprenorphine (BUTRANS) 5 MCG/HR PTWK 1 patch once a week. 01/09/19   [provider]  hydrochlorothiazide (HYDRODIURIL) 25 MG tablet Take 1 tablet (25 mg total) by mouth daily. Patient not taking: Reported on 04/17/2019 12/20/18  Azzie Glatter, FNP  loperamide (IMODIUM A-D) 2 MG tablet Take 2 tablets (4 mg total) by mouth 4 (four) times daily as needed for diarrhea or loose stools. Patient not taking: Reported on 02/20/2019 12/20/18   Azzie Glatter, FNP  Vitamin D, Ergocalciferol, (DRISDOL) 1.25 MG (50000 UNIT) CAPS capsule Take 1 capsule (50,000 Units total) by mouth every 7 (seven) days. Patient not taking: Reported on 04/17/2019 02/20/19   Azzie Glatter, FNP    Physical Exam: Constitutional: Moderately built and nourished. Vitals:   04/17/19 0300 04/17/19 0330 04/17/19 0400 04/17/19 0430  BP: 114/75 118/80 120/72 119/70  Pulse: 80 98 99 93  Resp: 10 16 13 11     Temp:      TempSrc:      SpO2: 99% 100% 100% 100%  Weight:      Height:       Eyes: Anicteric no pallor. ENMT: No discharge from the ears eyes nose or mouth. Neck: No mass felt.  No neck rigidity. Respiratory: No rhonchi or crepitations. Cardiovascular: S1-S2 heard. Abdomen: Soft nontender bowel sounds present. Musculoskeletal: No edema. Skin: No rash. Neurologic: Alert awake oriented time place and person.  Moves all extremities. Psychiatric: Appears normal but normal affect.   Labs on Admission: I have personally reviewed following labs and imaging studies  CBC: Recent Labs  Lab 04/16/19 2349  WBC 4.8  NEUTROABS 4.0  HGB 8.3*  HCT 25.0*  MCV 87.4  PLT [id number]   Basic Metabolic Panel: Recent Labs  Lab 04/16/19 2349  NA 128*  K 3.0*  CL 99  CO2 19*  GLUCOSE 89  BUN 64*  CREATININE 1.21  CALCIUM 7.2*   GFR: Estimated Creatinine Clearance: 49 mL/min (by C-G formula based on SCr of 1.21 mg/dL). Liver Function Tests: Recent Labs  Lab 04/16/19 2349  AST 105*  ALT 50*  ALKPHOS 73  BILITOT 0.7  PROT 5.1*  ALBUMIN 2.2*   Recent Labs  Lab 04/16/19 2349  LIPASE 145*   No results for input(s): AMMONIA in the last 168 hours. Coagulation Profile: No results for input(s): INR, PROTIME in the last 168 hours. Cardiac Enzymes: No results for input(s): CKTOTAL, CKMB, CKMBINDEX, TROPONINI in the last 168 hours. BNP (last 3 results) Recent Labs    10/23/18 1100  PROBNP 20.0   HbA1C: No results for input(s): HGBA1C in the last 72 hours. CBG: Recent Labs  Lab 04/17/19 0056 04/17/19 0108 04/17/19 0147 04/17/19 0245 04/17/19 0358  GLUCAP 29* 25* 94 92 113*   Lipid Profile: No results for input(s): CHOL, HDL, LDLCALC, TRIG, CHOLHDL, LDLDIRECT in the last 72 hours. Thyroid Function Tests: No results for input(s): TSH, T4TOTAL, FREET4, T3FREE, THYROIDAB in the last 72 hours. Anemia Panel: No results for input(s): VITAMINB12, FOLATE, FERRITIN, TIBC, IRON,  RETICCTPCT in the last 72 hours. Urine analysis:    Component Value Date/Time   COLORURINE YELLOW 04/17/2019 0118   APPEARANCEUR CLEAR 04/17/2019 0118   LABSPEC 1.014 04/17/2019 0118   PHURINE 5.0 04/17/2019 0118   GLUCOSEU NEGATIVE 04/17/2019 0118   HGBUR MODERATE (A) 04/17/2019 0118   BILIRUBINUR NEGATIVE 04/17/2019 0118   BILIRUBINUR Negative 02/20/2019 1411   KETONESUR NEGATIVE 04/17/2019 0118   PROTEINUR NEGATIVE 04/17/2019 0118   UROBILINOGEN 0.2 02/20/2019 1411   NITRITE NEGATIVE 04/17/2019 0118   LEUKOCYTESUR NEGATIVE 04/17/2019 0118   Sepsis Labs: @LABRCNTIP (procalcitonin:4,lacticidven:4) ) Recent Results (from the past 240 hour(s))  Urine culture     Status: None (Preliminary result)   Collection Time:  04/17/19  1:18 AM   Specimen: Urine, Random  Result Value Ref Range Status   Specimen Description URINE, RANDOM  Final   Special Requests   Final    NONE Performed at [facility] [address], [place], [place] [address]    Culture PENDING  Incomplete   Report Status PENDING  Incomplete     Radiological Exams on Admission: CT ABDOMEN PELVIS W CONTRAST  Result Date: 04/17/2019 CLINICAL DATA:  Pancreatitis. History of prostate cancer with bone Mets. EXAM: CT ABDOMEN AND PELVIS WITH CONTRAST TECHNIQUE: Multidetector CT imaging of the abdomen and pelvis was performed using the standard protocol following bolus administration of intravenous contrast. CONTRAST:  54mL OMNIPAQUE IOHEXOL 300 MG/ML  SOLN COMPARISON:  A 03/09/2018 FINDINGS: Lower chest: There is a small left-sided pleural effusion.The heart size is normal. Hepatobiliary: The liver is normal. Normal gallbladder.There is no biliary ductal dilation. Pancreas: Normal contours without ductal dilatation. No peripancreatic fluid collection. Spleen: There is a new heterogeneous mass in the spleen measuring approximately 2 cm. This is concerning for a new metastatic lesion. Adrenals/Urinary Tract:  --Adrenal glands: There appears to be a mass in the left adrenal gland measuring approximately 1.6 cm. --Right kidney/ureter: There is mild to moderate right-sided hydroureteronephrosis. --Left kidney/ureter: There is moderate severe left-sided hydronephrosis. There appears to be extensive soft tissue involving the mid to distal left ureter. --Urinary bladder: There is diffuse bladder wall thickening. There is probable invasion of the urinary bladder from the patient's rectal mass. Stomach/Bowel: --Stomach/Duodenum: No hiatal hernia or other gastric abnormality. Normal duodenal course and caliber. --Small bowel: No dilatation or inflammation. --Colon: There is diffuse wall thickening of the rectum and sigmoid colon. There are findings suspicious for an underlying rectal mass. --Appendix: Normal. Vascular/Lymphatic: Normal course and caliber of the major abdominal vessels. --pathologically enlarged retroperitoneal lymph nodes are noted. The --No mesenteric lymphadenopathy. --pathologically enlarged pelvic lymph nodes are noted. Reproductive: Unremarkable Other: There is a virtual complete lack of intra-abdominal fat. There is likely a moderate to large volume of abdominal ascites, however evaluation is limited by lack of abdominal fat. There is no free air. There is a cystic mass in the patient's mid abdomen measuring approximately 7 x 3.5 cm, favored to represent an area of loculated ascites. Other differential considerations include a large necrotic lymph node. Musculoskeletal. Extensive mixed sclerotic and lytic metastatic lesions are noted throughout lumbar spine and pelvis. For example, there is progressive destruction of the L4 vertebral body. These have progressed since the prior study. There is no likely spinal canal stenosis at the L3-L4 and L4-L5 levels secondary to metastatic disease. IMPRESSION: 1. Overall findings consistent with worsening metastatic disease as detailed above. 2. New bilateral  hydronephrosis, left worse than right. On the left, the hydronephrosis appears to be secondary to soft tissue involvement of the left ureter. On the right, the hydronephrosis may be secondary to retroperitoneal adenopathy or the patient's infiltrative bladder mass. 3. Persistent diffuse wall thickening of the rectum, improved from prior study. There is probable invasion of the posterior bladder wall as before. 4. Probable new intra-abdominal ascites as detailed above. Electronically Signed   By: Constance Holster M.D.   On: 04/17/2019 02:05     Assessment/Plan Active Problems:   Essential hypertension   Prostate cancer metastatic to multiple sites [facility])   Hypoglycemia   Hypothermia   Severe protein-calorie malnutrition ([place])    1. Severe hypoglycemia in the setting of poor oral intake with severe protein calorie  malnutrition presently on D10.  We will get nutrition input and also consult hospice. 2. Hypothermia likely from hypoglycemia.  Is slowly improving.  No signs of any infection.  Will check TSH and cortisol.  Follow CBGs. 3. Anemia appears to be chronic follow CBC. 4. Metastatic prostate cancer being followed by Dr. Alen Blew. 5. Bilateral hydronephrosis with new ascites likely from metastatic disease may get oncology input.  Presently creatinine is normal.  Given that patient has severe hypoglycemia with hypothermia will need close monitoring for any further deterioration in inpatient status.  Patient's home medication has to be reconciled.   DVT prophylaxis: Lovenox. Code Status: Full code as per the patient. Family Communication: Discussed with patient. Disposition Plan: To be determined. Consults called: Palliative care. Admission status: Inpatient.   Rise Patience MD [facility] Pager [phone].  If 7PM-7AM, please contact night-coverage [email or web address] Password [facility]  04/17/2019, 4:35 AM

## 2019-04-17 NOTE — Progress Notes (Signed)
I was asked by Dr. Sloan Leiter to comment on Anthony Villanueva case.  Full note to follow.  He is a pleasant 64 year old known to me with advanced prostate cancer that is recently progressing.  He was hospitalized for failure to thrive and hypoglycemia.  CT scan personally reviewed and showed disease progression.  His PSA in December is showing a rapid rise and was switched to [facility] but will less likely change the trajectory of his disease.   I have recommended supportive care as you are doing and agree with palliative medicine evaluation and potentially transitioning to hospice upon discharge.  We will continue to follow with you during this hospitalization.

## 2019-04-17 NOTE — ED Notes (Signed)
Brief changed, Pt repositioned. Linens changed.

## 2019-04-17 NOTE — ED Notes (Addendum)
Called lab and they need dark green collected for etoh labwork

## 2019-04-17 NOTE — ED Notes (Signed)
Pt aware urine sample needed and has urinal at bedside.

## 2019-04-18 DIAGNOSIS — C61 Malignant neoplasm of prostate: Secondary | ICD-10-CM

## 2019-04-18 DIAGNOSIS — R627 Adult failure to thrive: Secondary | ICD-10-CM

## 2019-04-18 DIAGNOSIS — N1330 Unspecified hydronephrosis: Secondary | ICD-10-CM

## 2019-04-18 DIAGNOSIS — C7951 Secondary malignant neoplasm of bone: Secondary | ICD-10-CM

## 2019-04-18 DIAGNOSIS — D630 Anemia in neoplastic disease: Secondary | ICD-10-CM

## 2019-04-18 DIAGNOSIS — Z7189 Other specified counseling: Secondary | ICD-10-CM

## 2019-04-18 DIAGNOSIS — Z515 Encounter for palliative care: Secondary | ICD-10-CM

## 2019-04-18 LAB — GLUCOSE, CAPILLARY
Glucose-Capillary: 107 mg/dL — ABNORMAL HIGH (ref 70–99)
Glucose-Capillary: 129 mg/dL — ABNORMAL HIGH (ref 70–99)
Glucose-Capillary: 64 mg/dL — ABNORMAL LOW (ref 70–99)
Glucose-Capillary: 73 mg/dL (ref 70–99)
Glucose-Capillary: 86 mg/dL (ref 70–99)
Glucose-Capillary: 89 mg/dL (ref 70–99)
Glucose-Capillary: 93 mg/dL (ref 70–99)

## 2019-04-18 MED ORDER — ALUM & MAG HYDROXIDE-SIMETH 200-200-20 MG/5ML PO SUSP: 30.0000 mL | Freq: Four times a day (QID) | ORAL | Status: DC | PRN

## 2019-04-18 MED ORDER — OLANZAPINE 5 MG PO TABS
5.0000 mg | ORAL_TABLET | Freq: Every day | ORAL | Status: DC
  Administered 2019-04-18 – 2019-04-19 (×2): 5 mg via ORAL
  Filled 2019-04-18 (×2): qty 1

## 2019-04-18 MED ORDER — PRO-STAT SUGAR FREE PO LIQD
30.0000 mL | Freq: Three times a day (TID) | ORAL | Status: DC
  Administered 2019-04-18 – 2019-04-20 (×7): 30 mL via ORAL
  Filled 2019-04-18 (×6): qty 30

## 2019-04-18 MED ORDER — ADULT MULTIVITAMIN W/MINERALS CH
1.0000 | ORAL_TABLET | Freq: Every day | ORAL | Status: DC
  Administered 2019-04-18 – 2019-04-20 (×3): 1 via ORAL
  Filled 2019-04-18 (×3): qty 1

## 2019-04-18 MED ORDER — MEGESTROL ACETATE 400 MG/10ML PO SUSP
400.0000 mg | Freq: Two times a day (BID) | ORAL | Status: DC
  Administered 2019-04-18 – 2019-04-20 (×5): 400 mg via ORAL
  Filled 2019-04-18 (×6): qty 10

## 2019-04-18 NOTE — Progress Notes (Signed)
Patient had small-medium diarrhea stool. Rust in color. Md notified and orders received. Eulas Post, RN

## 2019-04-18 NOTE — Progress Notes (Addendum)
Initial Nutrition Assessment  DOCUMENTATION CODES:   Severe malnutrition in context of chronic illness, Underweight  INTERVENTION:  - will order Magic cup TID with meals, each supplement provides 290 kcal and 9 grams of protein - will order Hormel Shake once/day, each supplement provides 500 kcal and 22 grams protein. - will order 30 mL Prostat BID, each supplement provides 100 kcal and 15 grams of protein. - will order daily multivitamin with minerals. - recommend trial of megace.    NUTRITION DIAGNOSIS:   Severe Malnutrition related to chronic illness, cancer and cancer related treatments as evidenced by severe fat depletion, severe muscle depletion.  GOAL:   Patient will meet greater than or equal to 90% of their needs  MONITOR:   PO intake, Supplement acceptance, Labs, Weight trends  REASON FOR ASSESSMENT:   Malnutrition Screening Tool, Consult Assessment of nutrition requirement/status  ASSESSMENT:   64 y.o. male with history of metastatic prostate cancer and anemia. He was found to be very weak and lethargic and his roommate called EMS. He reported not eating well for at least 8 days and that he was having some diarrhea during that time.  Patient consumed 10% of lunch today. He states this was a few bites of pancake. He reports poor appetite for 12 days and that during that time he did not have any abdominal pain/pressure or nausea, simply had decreased appetite, lack of interest in eating, and early satiety. Even drinking was difficult and would take him a prolonged period to consume 8 oz of liquid. He bought a case of Ensure for home, but only drank 1 bottle as he did not like this supplement.   Patient reports that he smokes weed and cigarettes constantly in an effort to increase appetite, but that it has been ineffective. He is open to trying megace. Informed him that it will likely take a few days to become effective.   He states that for the past 4-5 days PTA he did  not get out of bed. He would pee in a cup and dump it into a bucket. He states his girlfriend is his caregiver.   Patient reports he weighed 230 lb and that he last weighed this about 2 years ago. Current weight is 119 lb. Weight on 02/20/19 was 122 lb indicating 3 lb weight loss (2.4% body weight) in the past 2 months; this is not significant for time frame. Based on reported weight of 230 lb two years ago, this indicates 111 lb weight loss (48% body weight) in two years; significant for time frame.   Per notes: - metastatic prostate cancer with FTT--not a palliative chemo candidate - severe physical debility and functional debility - severe PCM - hypoglycemia on admission - anorexia--thought to be cancer induced  - Palliative Care consulted   Labs reviewed; CBGs: 89, 93, 73, and 129 mg/dl. Medications reviewed. 5 mg zyprexa/day.      NUTRITION - FOCUSED PHYSICAL EXAM:    Most Recent Value  Orbital Region  Moderate depletion  Upper Arm Region  Severe depletion  Thoracic and Lumbar Region  Severe depletion  Buccal Region  Severe depletion  Temple Region  Moderate depletion  Clavicle Bone Region  Severe depletion  Clavicle and Acromion Bone Region  Severe depletion  Scapular Bone Region  Severe depletion  Dorsal Hand  Severe depletion  Patellar Region  Severe depletion  Anterior Thigh Region  Severe depletion  Posterior Calf Region  Severe depletion  Edema (RD Assessment)  None  Hair  Reviewed  Eyes  Reviewed  Mouth  Reviewed  Skin  Reviewed  Nails  Reviewed       Diet Order:   Diet Order            Diet regular Room service appropriate? Yes; Fluid consistency: Thin  Diet effective now              EDUCATION NEEDS:   No education needs have been identified at this time  Skin:  Skin Assessment: Reviewed RN Assessment  Last BM:  3/10  Height:   Ht Readings from Last 1 Encounters:  04/16/19 6\' 1"  (1.854 m)    Weight:   Wt Readings from Last 1  Encounters:  04/16/19 54 kg    Ideal Body Weight:  83.6 kg  BMI:  Body mass index is 15.7 kg/m.  Estimated Nutritional Needs:   Kcal:  2160-2430 kcal  Protein:  110-120 grams  Fluid:  >/= 2.2 L/day     Jarome Matin, MS, RD, LDN, CNSC Inpatient Clinical Dietitian RD pager # available in AMION  After hours/weekend pager # available in [facility]

## 2019-04-18 NOTE — Progress Notes (Signed)
IP PROGRESS NOTE  Subjective:   [name] reports feeling comfortable this morning and is not in any distress.  He still has issues with appetite and food he reports inability to eat for the last 2 weeks.  He denies abdominal pain, nausea or bone pain.     Objective:  Vital signs in last 24 hours: Temp:  [98 F (36.7 C)-98.1 F (36.7 C)] 98 F (36.7 C) (03/10 0428) Pulse Rate:  [78-105] 85 (03/10 0428) Resp:  [11-20] 16 (03/10 0428) BP: (97-122)/(67-93) 97/67 (03/10 0428) SpO2:  [97 %-100 %] 100 % (03/10 0428) Weight change:  Last BM Date: 04/17/19  Intake/Output from previous day: 03/09 0701 - 03/10 0700 In: 817.4 [I.V.:817.4] Out: -    General: Chronically ill-appearing although without distress today. Head: Normocephalic atraumatic. Mouth: mucous membranes moist, pharynx normal without lesions Eyes: No scleral icterus.  Pupils are equal and round reactive to light. Resp: clear to auscultation bilaterally without rhonchi or wheezes or dullness to percussion. Cardio: regular rate and rhythm, S1, S2 normal, no murmur, click, rub or gallop GI: soft, non-tender; bowel sounds normal; no masses,  no organomegaly Musculoskeletal: No joint deformity or effusion. Neurological: No motor, sensory deficits.  Intact deep tendon reflexes. Skin: No rashes or lesions.    Lab Results: Recent Labs    04/16/19 2349 04/17/19 0508  WBC 4.8 5.5  HGB 8.3* 7.8*  HCT 25.0* 23.6*  PLT 276 239    BMET Recent Labs    04/16/19 2349 04/17/19 0508  NA 128* 128*  K 3.0* 3.6  CL 99 102  CO2 19* 17*  GLUCOSE 89 154*  BUN 64* 60*  CREATININE 1.21 1.15  CALCIUM 7.2* 7.0*    Studies/Results: CT ABDOMEN PELVIS W CONTRAST  Result Date: 04/17/2019 CLINICAL DATA:  Pancreatitis. History of prostate cancer with bone Mets. EXAM: CT ABDOMEN AND PELVIS WITH CONTRAST TECHNIQUE: Multidetector CT imaging of the abdomen and pelvis was performed using the standard protocol following bolus  administration of intravenous contrast. CONTRAST:  60mL OMNIPAQUE IOHEXOL 300 MG/ML  SOLN COMPARISON:  A 03/09/2018 FINDINGS: Lower chest: There is a small left-sided pleural effusion.The heart size is normal. Hepatobiliary: The liver is normal. Normal gallbladder.There is no biliary ductal dilation. Pancreas: Normal contours without ductal dilatation. No peripancreatic fluid collection. Spleen: There is a new heterogeneous mass in the spleen measuring approximately 2 cm. This is concerning for a new metastatic lesion. Adrenals/Urinary Tract: --Adrenal glands: There appears to be a mass in the left adrenal gland measuring approximately 1.6 cm. --Right kidney/ureter: There is mild to moderate right-sided hydroureteronephrosis. --Left kidney/ureter: There is moderate severe left-sided hydronephrosis. There appears to be extensive soft tissue involving the mid to distal left ureter. --Urinary bladder: There is diffuse bladder wall thickening. There is probable invasion of the urinary bladder from the patient's rectal mass. Stomach/Bowel: --Stomach/Duodenum: No hiatal hernia or other gastric abnormality. Normal duodenal course and caliber. --Small bowel: No dilatation or inflammation. --Colon: There is diffuse wall thickening of the rectum and sigmoid colon. There are findings suspicious for an underlying rectal mass. --Appendix: Normal. Vascular/Lymphatic: Normal course and caliber of the major abdominal vessels. --pathologically enlarged retroperitoneal lymph nodes are noted. The --No mesenteric lymphadenopathy. --pathologically enlarged pelvic lymph nodes are noted. Reproductive: Unremarkable Other: There is a virtual complete lack of intra-abdominal fat. There is likely a moderate to large volume of abdominal ascites, however evaluation is limited by lack of abdominal fat. There is no free air. There is a cystic mass  in the patient's mid abdomen measuring approximately 7 x 3.5 cm, favored to represent an area of  loculated ascites. Other differential considerations include a large necrotic lymph node. Musculoskeletal. Extensive mixed sclerotic and lytic metastatic lesions are noted throughout lumbar spine and pelvis. For example, there is progressive destruction of the L4 vertebral body. These have progressed since the prior study. There is no likely spinal canal stenosis at the L3-L4 and L4-L5 levels secondary to metastatic disease. IMPRESSION: 1. Overall findings consistent with worsening metastatic disease as detailed above. 2. New bilateral hydronephrosis, left worse than right. On the left, the hydronephrosis appears to be secondary to soft tissue involvement of the left ureter. On the right, the hydronephrosis may be secondary to retroperitoneal adenopathy or the patient's infiltrative bladder mass. 3. Persistent diffuse wall thickening of the rectum, improved from prior study. There is probable invasion of the posterior bladder wall as before. 4. Probable new intra-abdominal ascites as detailed above. Electronically Signed   By: Constance Holster M.D.   On: 04/17/2019 02:05    Medications: I have reviewed the patient's current medications.  Assessment/Plan:  64 year old with:  1.  Advanced prostate cancer with metastatic disease to the bone and adenopathy that has progressed rather rapidly despite androgen deprivation, palliative radiation therapy as well as recently Zytiga.  The natural course of this disease is discussed today and he has developed castration-resistant disease and the only thing that can offer some palliation would be systemic chemotherapy.  Unfortunately he is not a candidate for it given his overall debilitation, poor performance status and poor nutritional status.  That makes his tolerance to chemotherapy very limited and the benefit would be marginal.   I recommended that proceeding with supportive care only at this time with consideration for hospice moving forward.  2.   Hydronephrosis: His kidney function is improving after presenting with creatinine of 1.45.  Do not see any need for intervention at this time.  3.  Anemia: Related to malignancy and renal insufficiency.  Will transfuse with a hemoglobin of less than 7 at this time.   4.  Prognosis and goals of care: Prognosis poor with limited life expectancy of less than 6 months.  It would be a hospice candidate if you opts to at this moment.   25  minutes spent on this encounter.  More than 50% of time was spent face-to-face dedicated to reviewing laboratory data, imaging studies and discussing plan of care.    LOS: 1 day   Zola Button 04/18/2019, 7:29 AM

## 2019-04-18 NOTE — Progress Notes (Signed)
PROGRESS NOTE    Anthony Villanueva  [id number] DOB: 1955-11-29 DOA: 04/16/2019 PCP: Anthony Glatter, FNP    Brief Narrative:  64 year old gentleman with history of metastatic prostate cancer , anemia, debility, poor functional status brought to the hospital with severe debility, lethargy, patient's roommate called EMS.  Patient told that he has not been eating for more than a week. In the emergency room, he was hypothermic with temperature 93.7, blood sugars 29.  Other labs were remarkable.  CT scan of the abdomen pelvis shows new ascites, bilateral hydronephrosis, extensive metastatic disease.   Patient stated that he is mostly in the bed, cannot walk, has not been eating and only smoking marijuana for more than 10 days.  Assessment & Plan:   Active Problems:   Essential hypertension   Prostate cancer metastatic to multiple sites [facility])   Hypoglycemia   Hypothermia   Severe protein-calorie malnutrition ([place])  Metastatic prostate cancer with failure to thrive, severe physical debility and functional debility, severe protein calorie malnutrition, hypothermia and hypoglycemia:  Probably due to cancer induced anorexia and poor appetite. No evidence of localizing infection. Blood sugars responded to IV fluid, dextrose and rewarming. Discontinue dextrose today, encourage oral diet.  Replace electrolytes. Discussed with his oncologist and patient was seen in consultation, patient has widespread metastatic prostate cancer with profound physical debility, he is not a palliative chemotherapy candidate and suggested hospice. Continue adequate pain medications and symptom relief.  started on Zyprexa for appetite and sleep. Palliative care consultation, anticipate hospice at home versus hospice home.   DVT prophylaxis: Lovenox Code Status: Full code Family Communication: Patient's daughter on the phone, she is traveling to [place] from [place] Disposition Plan: patient is from home.  Anticipated DC to home versus hospice, Barriers to discharge, symptom control, pain control, no safe disposition yet.   Consultants:   Oncology  Palliative  Procedures:   None  Antimicrobials:   None   Subjective: Patient seen and examined.  Today he is more alert, he is more comfortable and was able to explain to me that he is very debilitated, he has not been able to get out of the bed for more than 10 days. He has no appetite. No other overnight events.  Blood sugars are fairly stable.  He is trying to eat a small bites more frequently.  Objective: Vitals:   04/17/19 2015 04/17/19 2209 04/18/19 0428 04/18/19 1320  BP: 115/73 102/73 97/67 103/67  Pulse: 91 84 85 93  Resp: 14 18 16 18   Temp:  98.1 F (36.7 C) 98 F (36.7 C) 98.4 F (36.9 C)  TempSrc:    Oral  SpO2: 100%  100% 100%  Weight:      Height:        Intake/Output Summary (Last 24 hours) at 04/18/2019 1418 Last data filed at 04/18/2019 1322 Gross per 24 hour  Intake 120 ml  Output 300 ml  Net -180 ml   Filed Weights   04/16/19 2315  Weight: 54 kg    Examination:  General exam: Appears calm and comfortable, frail and thin looking. Respiratory system: Clear to auscultation. Respiratory effort normal. Cardiovascular system: S1 & S2 heard, RRR. No JVD, murmurs, rubs, gallops or clicks. No pedal edema. Gastrointestinal system: Abdomen is nondistended, soft and nontender. No organomegaly or masses felt. Normal bowel sounds heard. Central nervous system: Alert and oriented. No focal neurological deficits. Extremities: Symmetric 5 x 5 power. Skin: No rashes, lesions or ulcers Psychiatry: Judgement and insight appear normal.  Mood & affect appropriate.     Data Reviewed: I have personally reviewed following labs and imaging studies  CBC: Recent Labs  Lab 04/16/19 2349 04/17/19 0508  WBC 4.8 5.5  NEUTROABS 4.0 4.8  HGB 8.3* 7.8*  HCT 25.0* 23.6*  MCV 87.4 86.8  PLT 276 [id number]   Basic Metabolic  Panel: Recent Labs  Lab 04/16/19 2349 04/17/19 0508  NA 128* 128*  K 3.0* 3.6  CL 99 102  CO2 19* 17*  GLUCOSE 89 154*  BUN 64* 60*  CREATININE 1.21 1.15  CALCIUM 7.2* 7.0*   GFR: Estimated Creatinine Clearance: 51.5 mL/min (by C-G formula based on SCr of 1.15 mg/dL). Liver Function Tests: Recent Labs  Lab 04/16/19 2349 04/17/19 0508  AST 105* 66*  ALT 50* 41  ALKPHOS 73 64  BILITOT 0.7 0.6  PROT 5.1* 4.4*  ALBUMIN 2.2* 1.8*   Recent Labs  Lab 04/16/19 2349  LIPASE 145*   No results for input(s): AMMONIA in the last 168 hours. Coagulation Profile: No results for input(s): INR, PROTIME in the last 168 hours. Cardiac Enzymes: No results for input(s): CKTOTAL, CKMB, CKMBINDEX, TROPONINI in the last 168 hours. BNP (last 3 results) Recent Labs    10/23/18 1100  PROBNP 20.0   HbA1C: No results for input(s): HGBA1C in the last 72 hours. CBG: Recent Labs  Lab 04/17/19 2026 04/18/19 0254 04/18/19 0628 04/18/19 0749 04/18/19 1142  GLUCAP 104* 89 93 73 129*   Lipid Profile: No results for input(s): CHOL, HDL, LDLCALC, TRIG, CHOLHDL, LDLDIRECT in the last 72 hours. Thyroid Function Tests: Recent Labs    04/17/19 0508  TSH 1.839   Anemia Panel: No results for input(s): VITAMINB12, FOLATE, FERRITIN, TIBC, IRON, RETICCTPCT in the last 72 hours. Sepsis Labs: No results for input(s): PROCALCITON, LATICACIDVEN in the last 168 hours.  Recent Results (from the past 240 hour(s))  Urine culture     Status: Abnormal   Collection Time: 04/17/19  1:18 AM   Specimen: Urine, Random  Result Value Ref Range Status   Specimen Description URINE, RANDOM  Final   Special Requests   Final    NONE Performed at [facility] [address], [place], [place] [address]    Culture (A)  Final    <10,000 COLONIES/mL INSIGNIFICANT GROWTH Performed at [facility] [address], [place],  [address]    Report Status 04/17/2019 FINAL  Final   SARS CORONAVIRUS 2 (TAT 6-24 HRS) Nasopharyngeal Nasopharyngeal Swab     Status: None   Collection Time: 04/17/19  1:32 AM   Specimen: Nasopharyngeal Swab  Result Value Ref Range Status   SARS Coronavirus 2 NEGATIVE NEGATIVE Final    Comment: (NOTE) SARS-CoV-2 target nucleic acids are NOT DETECTED. The SARS-CoV-2 RNA is generally detectable in upper and lower respiratory specimens during the acute phase of infection. Negative results do not preclude SARS-CoV-2 infection, do not rule out co-infections with other pathogens, and should not be used as the sole basis for treatment or other patient management decisions. Negative results must be combined with clinical observations, patient history, and epidemiological information. The expected result is Negative. Fact Sheet for Patients: SugarRoll.be Fact Sheet for Healthcare Providers: https://www.woods-mathews.com/ This test is not yet approved or cleared by the [place] FDA and  has been authorized for detection and/or diagnosis of SARS-CoV-2 by FDA under an Emergency Use Authorization (EUA). This EUA will remain  in effect (meaning this test can be used) for the  duration of the COVID-19 declaration under Section 56 4(b)(1) of the Act, 21 U.S.C. section 360bbb-3(b)(1), unless the authorization is terminated or revoked sooner. Performed at [facility], [place] [address] [place], [place] [address]          Radiology Studies: CT ABDOMEN PELVIS W CONTRAST  Result Date: 04/17/2019 CLINICAL DATA:  Pancreatitis. History of prostate cancer with bone Mets. EXAM: CT ABDOMEN AND PELVIS WITH CONTRAST TECHNIQUE: Multidetector CT imaging of the abdomen and pelvis was performed using the standard protocol following bolus administration of intravenous contrast. CONTRAST:  92mL OMNIPAQUE IOHEXOL 300 MG/ML  SOLN COMPARISON:  A 03/09/2018 FINDINGS: Lower chest: There is a small left-sided pleural  effusion.The heart size is normal. Hepatobiliary: The liver is normal. Normal gallbladder.There is no biliary ductal dilation. Pancreas: Normal contours without ductal dilatation. No peripancreatic fluid collection. Spleen: There is a new heterogeneous mass in the spleen measuring approximately 2 cm. This is concerning for a new metastatic lesion. Adrenals/Urinary Tract: --Adrenal glands: There appears to be a mass in the left adrenal gland measuring approximately 1.6 cm. --Right kidney/ureter: There is mild to moderate right-sided hydroureteronephrosis. --Left kidney/ureter: There is moderate severe left-sided hydronephrosis. There appears to be extensive soft tissue involving the mid to distal left ureter. --Urinary bladder: There is diffuse bladder wall thickening. There is probable invasion of the urinary bladder from the patient's rectal mass. Stomach/Bowel: --Stomach/Duodenum: No hiatal hernia or other gastric abnormality. Normal duodenal course and caliber. --Small bowel: No dilatation or inflammation. --Colon: There is diffuse wall thickening of the rectum and sigmoid colon. There are findings suspicious for an underlying rectal mass. --Appendix: Normal. Vascular/Lymphatic: Normal course and caliber of the major abdominal vessels. --pathologically enlarged retroperitoneal lymph nodes are noted. The --No mesenteric lymphadenopathy. --pathologically enlarged pelvic lymph nodes are noted. Reproductive: Unremarkable Other: There is a virtual complete lack of intra-abdominal fat. There is likely a moderate to large volume of abdominal ascites, however evaluation is limited by lack of abdominal fat. There is no free air. There is a cystic mass in the patient's mid abdomen measuring approximately 7 x 3.5 cm, favored to represent an area of loculated ascites. Other differential considerations include a large necrotic lymph node. Musculoskeletal. Extensive mixed sclerotic and lytic metastatic lesions are noted  throughout lumbar spine and pelvis. For example, there is progressive destruction of the L4 vertebral body. These have progressed since the prior study. There is no likely spinal canal stenosis at the L3-L4 and L4-L5 levels secondary to metastatic disease. IMPRESSION: 1. Overall findings consistent with worsening metastatic disease as detailed above. 2. New bilateral hydronephrosis, left worse than right. On the left, the hydronephrosis appears to be secondary to soft tissue involvement of the left ureter. On the right, the hydronephrosis may be secondary to retroperitoneal adenopathy or the patient's infiltrative bladder mass. 3. Persistent diffuse wall thickening of the rectum, improved from prior study. There is probable invasion of the posterior bladder wall as before. 4. Probable new intra-abdominal ascites as detailed above. Electronically Signed   By: Constance Holster M.D.   On: 04/17/2019 02:05        Scheduled Meds: . enoxaparin (LOVENOX) injection  40 mg Subcutaneous Q24H  . OLANZapine  5 mg Oral QHS   Continuous Infusions:   LOS: 1 day    Time spent: 30 minutes    Barb Merino, MD [facility] Pager [phone]

## 2019-04-18 NOTE — Consult Note (Signed)
Palliative care consult  Reason for consult: Goals of care in light of poor functional status and nutrition with metastatic prostate cancer\  Medicare consult received.  Chart reviewed including personal review of pertinent labs and imaging.  Discussed case with bedside staff as well as Dr. Sloan Leiter.  Briefly, Anthony Villanueva is a 64 year old male with past medical history of prostate cancer, anemia, debility with poor functional status was brought to the hospital with increasing lethargy after his roommate/brother called EMS.  He reports he has not been eating for over a week.  He was found in the emergency room be hypothermic with blood sugar of 29.  CT of abdomen revealed no ascites, bilateral hydronephrosis and the stents of metastatic disease.  He was evaluated by Dr. Alen Blew who follows him from oncology.  He is noted to have disease progression and now with castration resistant disease.  Unfortunately, he is not a candidate for systemic chemotherapy due to his poor functional status.  Recommendation was for best supportive care with consideration for hospice.  Palliative consulted for goals of care.  I met today with Anthony Villanueva.  I introduced palliative care as specialized medical care for people living with serious illness. It focuses on providing relief from the symptoms and stress of a serious illness. The goal is to improve quality of life for both the patient and the family.   We discussed clinical course as well as wishes moving forward in regard to  his care in light of his progressive metastatic cancer.  He reports that his current living situation is, "not good."  He relays history of increasing weakness with decreased appetite.  Attempted to further clarify if he is having early satiety versus lack of appetite but he was not really able to explain exactly what has been causing him not to eat.  He reports he will smoke marijuana and it "gives me a good appetite."  But then whenever he goes to  take in some food, it just goes away.  Attempted to discuss with him the severity of his illness and concern that he is developing castrate resistant disease and is not a good candidate for systemic chemotherapy.  While he has expressed to other providers that he understands the terminal nature of his disease, he was unwilling to really engage in this conversation with me.  We also talked about things are most important to him, including his daughter.  She is currently traveling from [place] to see him.  -Limited conversation regarding the severity of his illness and pathways forward due to Anthony Villanueva not really being open to participating in conversation. -He gave me permission to call his daughter.  I called and spoke with her and she is traveling into town this evening.  We made a plan for a family meeting tomorrow at 73 AM in order to continue conversation and discuss next steps moving forward.  I am hopeful that he will be more open to conversation when family is present to help facilitate discussion..  Questions and concerns addressed.   PMT will continue to support holistically.  Time in: 1130 Time out: 1215 Total time: 45 minutes  Greater than 50%  of this time was spent counseling and coordinating care related to the above assessment and plan.  Micheline Rough, MD [place] [facility] [phone]

## 2019-04-19 DIAGNOSIS — E162 Hypoglycemia, unspecified: Principal | ICD-10-CM

## 2019-04-19 LAB — GLUCOSE, CAPILLARY
Glucose-Capillary: 58 mg/dL — ABNORMAL LOW (ref 70–99)
Glucose-Capillary: 64 mg/dL — ABNORMAL LOW (ref 70–99)
Glucose-Capillary: 61 mg/dL — ABNORMAL LOW (ref 70–99)
Glucose-Capillary: 80 mg/dL (ref 70–99)
Glucose-Capillary: 96 mg/dL (ref 70–99)
Glucose-Capillary: 86 mg/dL (ref 70–99)

## 2019-04-19 MED ORDER — FENTANYL CITRATE (PF) 100 MCG/2ML IJ SOLN
25.0000 ug | INTRAMUSCULAR | Status: DC | PRN
  Administered 2019-04-19 – 2019-04-20 (×6): 25 ug via INTRAVENOUS
  Filled 2019-04-19 (×7): qty 2

## 2019-04-19 MED ORDER — DEXTROSE 50 % IV SOLN
12.5000 g | INTRAVENOUS | Status: AC
  Administered 2019-04-19: 12.5 g via INTRAVENOUS

## 2019-04-19 MED ORDER — DEXTROSE IN LACTATED RINGERS 5 % IV SOLN: INTRAVENOUS | Status: DC

## 2019-04-19 MED ORDER — DIGOXIN 0.25 MG/ML IJ SOLN
0.2500 mg | Freq: Every day | INTRAMUSCULAR | Status: AC
  Administered 2019-04-19: 0.25 mg via INTRAVENOUS
  Filled 2019-04-19: qty 1

## 2019-04-19 MED FILL — ABIRATERONE ACETATE 250 MG: 250 | 30 days supply | Qty: 120 | Fill #0

## 2019-04-19 NOTE — Significant Event (Addendum)
Rapid Response Event Note  Overview: Time Called: 0121 Arrival Time: 0130    Initial Focused Assessment:  Called in reference to elevated HR and low BP.  Primary RN advises HR 150s to 170s, and SBP in 70s.  Arrived in room to see pt resting in bed, A/Ox4, no obvious distress noted.  Patient denies any acute pain, did state had some chronic pain in his hip and leg but nothing new.  Connected patient to RRT monitor, HR 140s, BP 73/64, retook on other arm with similar results, however, the low BP doesn't match up with this asymptomatic patient. No fever.  A 536ml NS bolus started, per RRT protocol orders.  Sharlet Salina, NP paged.  She advised me that she was aware of the patient's situation and d/t the low BP unable to give anything else than the Digoxin that was recently given prior to RRT  being called.  She advised me that she was ok with BP as long as MAP was above 55, but ok'd the 571ml NS bolus.  Stayed in room until bolus finished.  Pt's HR remains 130s to 140s,  SBP remains in the 70s, but MAP in the higher 60s.  Interventions:  RRT nurse assessment, 520ml NS bolus  Plan of Care (if not transferred):  Due to patient asymptomatic, and Denny,NP advising she is ok with AP greater than 55. I advised the primary RN to again notify RRT @ [phone] if any concerns or signs of poor perfusion like lethary, confusion, shortness of breath, light headedness.  Also advised primary RN with current BP to hold off on any pain medications until SBP at least above 95.  Event Summary: Name of Physician Notified: Denny,NP at 0136  Outcome: Stayed in room and stabalized  Event End Time: [place] ICU/[facility] / [place] / Rapid Response Nurse Rapid Response Number:  [phone] ICU Charge Nurse Number:  [phone]

## 2019-04-19 NOTE — Progress Notes (Signed)
Hypoglycemic Event  CBG: 58  Treatment: 4 oz juice/soda and D50 50 mL (25 gm)  Symptoms: None No symptoms  Follow-up CBG: Time:0620 CBG Result:64  Possible Reasons for Event: Other: Bolused 500cc of N.saline   Comments/MD notified: D5 in LR and 1amp.    Anthony Villanueva

## 2019-04-19 NOTE — Progress Notes (Addendum)
   04/19/19 0042  Vitals  Temp 98.2 F (36.8 C)  Temp Source Oral  BP 93/65  MAP (mmHg) 75  BP Location Left Arm  BP Method Automatic  Patient Position (if appropriate) Lying  Pulse Rate (!) 149  Resp 14  Oxygen Therapy  SpO2 100 %  O2 Device Room Air  MEWS Score  MEWS Temp 0  MEWS Systolic 1  MEWS Pulse 3  MEWS RR 0  MEWS LOC 0  MEWS Score 4  MEWS Score Color Red  RN notified On call provider. NP provided orders (63ml digoxin and Keep MAP>55.  Rapid Response was then notified. Orders given to Bolus 500 of fluid.  Red MEWS protocol initiated, pt. stable will continue to monitor.

## 2019-04-19 NOTE — Progress Notes (Signed)
Hypoglycemic Event  CBG: 61  Treatment: D50 25 mL (12.5 gm)  Symptoms: None  Follow-up CBG: Time: 0815 CBG Result: 80  Possible Reasons for Event: Inadequate meal intake  Comments/MD notified: Dr. Sloan Leiter made aware.    Anthony Villanueva, Francetta Found

## 2019-04-19 NOTE — Progress Notes (Signed)
Hydrologist [facility]) Hospital Liaison: RN note     Notified by [facility] [place], CSW of patient/family request for [facility] services at home after discharge. Chart and patient information reviewed by [facility] physician. Hospice eligibility confirmed.    Writer spoke with daughter Milderd Meager to initiate education related to hospice philosophy, services and team approach to care.           verbalized understanding of information given. Per discussion, plan is for discharge to home by PTAR.   Please send signed and completed DNR form home with patient/family. Patient will need prescriptions for discharge comfort medications.      DME needs have been discussed, patient currently has the following equipment in the home: none.  Patient/family requests the following DME for delivery to the home: hospital bed, walker and 3N1. [employer] equipment manager has been notified and will contact DME provider to arrange delivery to the home. Home address has been verified and is correct in the chart.  Milderd Meager is the family member to contact to arrange time of delivery.      [facility] aware of the above. Please notify [facility] when patient is ready to leave the unit at discharge. (Call [phone] or [phone] after 5pm.) [facility] information and contact numbers given to [place].       Please call with any hospice related questions.      Thank you for this referral.      Farrel Gordon, RN, [facility] (listed on AMION under Hospice and [place] [facility] [place])   [phone]

## 2019-04-19 NOTE — Progress Notes (Signed)
PROGRESS NOTE    Anthony Villanueva  [id number] DOB: 07-16-1955 DOA: 04/16/2019 PCP: Azzie Glatter, FNP    Brief Narrative:  64 year old gentleman with history of metastatic prostate cancer , anemia, debility, poor functional status brought to the hospital with severe debility, lethargy, patient's roommate called EMS.  Patient told that he has not been eating for more than a week. In the emergency room, he was hypothermic with temperature 93.7, blood sugars 29.  Other labs were remarkable.  CT scan of the abdomen pelvis showed new ascites, bilateral hydronephrosis, extensive metastatic disease.   Patient stated that he is mostly in the bed, cannot walk, has not been eating and only smoking marijuana for more than 10 days.  Assessment & Plan:   Active Problems:   Essential hypertension   Prostate cancer metastatic to multiple sites [facility])   Hypoglycemia   Hypothermia   Severe protein-calorie malnutrition ([place])  Metastatic prostate cancer with failure to thrive,  severe physical debility and functional debility,  severe protein calorie malnutrition,  hypothermia and hypoglycemia Marijuana use  Plan: Patient with cancer induced anorexia, poor appetite, hypoglycemia and hypothermia.  Extensive metastatic prostate cancer not amenable to treatment or palliative chemotherapy due to severe debility. Terminal cancer. Continue adequate pain medications, will try to optimize pain control and symptom control. Continue dextrose infusion today to correct his hypoglycemia.  Encourage oral sugar intake. Seen by palliative care.  Patient decided to go home with support from friends and families and enrolled into home hospice program. DNR.   DVT prophylaxis: Lovenox Code Status: DNR. Family Communication: Patient's daughter. Disposition Plan: patient is from home. Anticipated DC to home with hospice.   Barriers to discharge, symptom control, pain control, hospice arrangements at  home.   Consultants:   Oncology  Palliative  Procedures:   None  Antimicrobials:   None   Subjective: Patient seen and examined.  At the time of my interview, he was comfortable.  He was able to eat his pancake in the morning. Early morning events noted, patient had a hypoglycemic episode with blood sugars less than 60, responded to dextrose. Patient decided to go home with hospice arrangements at home.  Objective: Vitals:   04/19/19 0947 04/19/19 1222 04/19/19 1224 04/19/19 1229  BP: 98/64 102/61  103/62  Pulse: (!) 109 (!) 101 (!) 106 (!) 108  Resp: 16 14  16   Temp: 98.2 F (36.8 C) 97.9 F (36.6 C)  97.6 F (36.4 C)  TempSrc: Oral Oral  Oral  SpO2:  (!) 64% 100% 94%  Weight:      Height:        Intake/Output Summary (Last 24 hours) at 04/19/2019 1316 Last data filed at 04/19/2019 1100 Gross per 24 hour  Intake 560 ml  Output 1000 ml  Net -440 ml   Filed Weights   04/16/19 2315  Weight: 54 kg    Examination:  General exam: Appears calm , frail and cachectic. Respiratory system: Clear to auscultation. Respiratory effort normal. Cardiovascular system: S1 & S2 heard, RRR. No JVD, murmurs, rubs, gallops or clicks. No pedal edema. Gastrointestinal system: Abdomen is nondistended, soft and nontender. No organomegaly or masses felt. Normal bowel sounds heard. Central nervous system: Alert and oriented. No focal neurological deficits. Extremities: Symmetric 5 x 5 power. Skin: No rashes, lesions or ulcers Psychiatry: Judgement and insight appear normal. Mood & affect appropriate.     Data Reviewed: I have personally reviewed following labs and imaging studies  CBC: Recent Labs  Lab 04/16/19 2349 04/17/19 0508  WBC 4.8 5.5  NEUTROABS 4.0 4.8  HGB 8.3* 7.8*  HCT 25.0* 23.6*  MCV 87.4 86.8  PLT 276 [id number]   Basic Metabolic Panel: Recent Labs  Lab 04/16/19 2349 04/17/19 0508  NA 128* 128*  K 3.0* 3.6  CL 99 102  CO2 19* 17*  GLUCOSE 89 154*  BUN  64* 60*  CREATININE 1.21 1.15  CALCIUM 7.2* 7.0*   GFR: Estimated Creatinine Clearance: 51.5 mL/min (by C-G formula based on SCr of 1.15 mg/dL). Liver Function Tests: Recent Labs  Lab 04/16/19 2349 04/17/19 0508  AST 105* 66*  ALT 50* 41  ALKPHOS 73 64  BILITOT 0.7 0.6  PROT 5.1* 4.4*  ALBUMIN 2.2* 1.8*   Recent Labs  Lab 04/16/19 2349  LIPASE 145*   No results for input(s): AMMONIA in the last 168 hours. Coagulation Profile: No results for input(s): INR, PROTIME in the last 168 hours. Cardiac Enzymes: No results for input(s): CKTOTAL, CKMB, CKMBINDEX, TROPONINI in the last 168 hours. BNP (last 3 results) Recent Labs    10/23/18 1100  PROBNP 20.0   HbA1C: No results for input(s): HGBA1C in the last 72 hours. CBG: Recent Labs  Lab 04/19/19 0625 04/19/19 0631 04/19/19 0735 04/19/19 0815 04/19/19 1218  GLUCAP 40* 64* 61* 80 96   Lipid Profile: No results for input(s): CHOL, HDL, LDLCALC, TRIG, CHOLHDL, LDLDIRECT in the last 72 hours. Thyroid Function Tests: Recent Labs    04/17/19 0508  TSH 1.839   Anemia Panel: No results for input(s): VITAMINB12, FOLATE, FERRITIN, TIBC, IRON, RETICCTPCT in the last 72 hours. Sepsis Labs: No results for input(s): PROCALCITON, LATICACIDVEN in the last 168 hours.  Recent Results (from the past 240 hour(s))  Urine culture     Status: Abnormal   Collection Time: 04/17/19  1:18 AM   Specimen: Urine, Random  Result Value Ref Range Status   Specimen Description URINE, RANDOM  Final   Special Requests   Final    NONE Performed at [facility] [address], [place], [place] [address]    Culture (A)  Final    <10,000 COLONIES/mL INSIGNIFICANT GROWTH Performed at [facility] [address], [place], [place] [address]    Report Status 04/17/2019 FINAL  Final  SARS CORONAVIRUS 2 (TAT 6-24 HRS) Nasopharyngeal Nasopharyngeal Swab     Status: None   Collection Time: 04/17/19  1:32 AM   Specimen:  Nasopharyngeal Swab  Result Value Ref Range Status   SARS Coronavirus 2 NEGATIVE NEGATIVE Final    Comment: (NOTE) SARS-CoV-2 target nucleic acids are NOT DETECTED. The SARS-CoV-2 RNA is generally detectable in upper and lower respiratory specimens during the acute phase of infection. Negative results do not preclude SARS-CoV-2 infection, do not rule out co-infections with other pathogens, and should not be used as the sole basis for treatment or other patient management decisions. Negative results must be combined with clinical observations, patient history, and epidemiological information. The expected result is Negative. Fact Sheet for Patients: SugarRoll.be Fact Sheet for Healthcare Providers: https://www.woods-mathews.com/ This test is not yet approved or cleared by the [place] FDA and  has been authorized for detection and/or diagnosis of SARS-CoV-2 by FDA under an Emergency Use Authorization (EUA). This EUA will remain  in effect (meaning this test can be used) for the duration of the COVID-19 declaration under Section 56 4(b)(1) of the Act, 21 U.S.C. section 360bbb-3(b)(1), unless the authorization is terminated or revoked sooner.  Performed at [facility], [place] [address] [place], [place] [address]          Radiology Studies: No results found.      Scheduled Meds: . feeding supplement (PRO-STAT SUGAR FREE 64)  30 mL Oral TID BM  . megestrol  400 mg Oral BID  . multivitamin with minerals  1 tablet Oral Daily  . OLANZapine  5 mg Oral QHS   Continuous Infusions: . dextrose 5% lactated ringers 50 mL/hr at 04/19/19 0758     LOS: 2 days    Time spent: 30 minutes    Barb Merino, MD [facility] Pager [phone]

## 2019-04-19 NOTE — Progress Notes (Signed)
MD via phone given update on Pt. Pt's incontinent of moderate amount of stool noted to be bloody with mucous.Maitain current plan of care for Pt

## 2019-04-19 NOTE — Progress Notes (Signed)
Daily Progress Note   Patient Name: Anthony Villanueva       Date: 04/19/2019 DOB: 1955-10-31  Age: 64 y.o. MRN#: [id number] Attending Physician: Barb Merino, MD Primary Care Physician: Azzie Glatter, FNP Admit Date: 04/16/2019  Reason for Consultation/Follow-up: Establishing goals of care  Subjective: I met today with Anthony Villanueva in conjunction with his daughter, granddaughter, and nephew.  We discussed clinical course as well as wishes moving forward in regard to his care plan in light of his continued progression of prostate cancer despite continued therapy.  We discussed his poor functional status and that he would not be a candidate for systemic chemotherapy.  Discussed that with his continued hypoglycemia, prognosis is likely limited to days to weeks at this point.  Concepts specific to code status and rehospitalization discussed.  We discussed difference between a aggressive medical intervention path and a palliative, comfort focused care path.  Values and goals of care important to patient and family were attempted to be elicited.  Anthony Villanueva reports that he is at peace with the fact that he is dying.  His main goal is to be at his own home.  Discussed concerns about his home environment per his previous conversation with me about having difficulty managing at home, however, he is adamant that he is only going to agree to go back to his own home.  He is agreeable to hospice services on discharge.  Concept of Hospice and Palliative Care were discussed  Questions and concerns addressed.   PMT will continue to support holistically.  Length of Stay: 2  Current Medications: Scheduled Meds:  . feeding supplement (PRO-STAT SUGAR FREE 64)  30 mL Oral TID BM  . megestrol  400 mg Oral BID  .  multivitamin with minerals  1 tablet Oral Daily  . OLANZapine  5 mg Oral QHS    Continuous Infusions: . dextrose 5% lactated ringers 50 mL/hr at 04/19/19 0758    PRN Meds: acetaminophen **OR** acetaminophen, alum & mag hydroxide-simeth, dextrose, fentaNYL (SUBLIMAZE) injection, morphine injection, ondansetron **OR** ondansetron (ZOFRAN) IV, oxyCODONE  Physical Exam         General: Alert, awake, in no acute distress. Frail and chronically ill-appearing.  Cachectic. HEENT: No bruits, no goiter, no JVD Heart: Tachycardic Lungs: Good air movement, clear Abdomen: Soft, nontender, nondistended, positive  bowel sounds.  Ext: No significant edema.  Significant muscle wasting Skin: Warm and dry Neuro: Grossly intact, nonfocal.   Vital Signs: BP 117/70 (BP Location: Right Arm)   Pulse (!) 102   Temp 97.6 F (36.4 C) (Oral)   Resp 16   Ht _0  (1.854 m)   Wt 54 kg   SpO2 94%   BMI 15.70 kg/m  SpO2: SpO2: 94 % O2 Device: O2 Device: Room Air O2 Flow Rate: O2 Flow Rate (L/min): 2 L/min  Intake/output summary:   Intake/Output Summary (Last 24 hours) at 04/19/2019 1842 Last data filed at 04/19/2019 1802 Gross per 24 hour  Intake 942.53 ml  Output 700 ml  Net 242.53 ml   LBM: Last BM Date: 04/19/19 Baseline Weight: Weight: 54 kg Most recent weight: Weight: 54 kg       Palliative Assessment/Data:    Flowsheet Rows     Most Recent Value  Intake Tab  Referral Department  Hospitalist  Unit at Time of Referral  Med/Surg Unit  Palliative Care Primary Diagnosis  Cancer  Date Notified  04/17/19  Palliative Care Type  New Palliative care  Reason for referral  Clarify Goals of Care  Date of Admission  04/17/19  Date first seen by Palliative Care  04/18/19  # of days Palliative referral response time  1 Day(s)  # of days IP prior to Palliative referral  0  Clinical Assessment  Palliative Performance Scale Score  40%  Psychosocial & Spiritual Assessment  Palliative Care  Outcomes  Patient/Family meeting held?  Yes  Who was at the meeting?  patient      Patient Active Problem List   Diagnosis Date Noted  . Hypoglycemia 04/17/2019  . Hypothermia 04/17/2019  . Severe protein-calorie malnutrition ([place]) 04/17/2019  . Vitamin D deficiency 02/22/2019  . Other chronic pain 02/22/2019  . Prostate cancer metastatic to multiple sites ([place]) 11/21/2018  . Blood in stool 10/03/2018  . Chronic diarrhea 09/20/2018  . Essential hypertension 09/20/2018  . History of medication noncompliance 09/20/2018  . Bilateral hip pain 06/26/2018  . Loose stools 06/26/2018  . Prostate cancer ([place]) 05/19/2017  . Pelvic mass 05/10/2017  . Constipation 05/10/2017  . Weight loss, unintentional 05/10/2017  . Hematuria 05/10/2017    Palliative Care Assessment & Plan   Patient Profile: Anthony Villanueva is a 64 year old male with past medical history of prostate cancer, anemia, debility with poor functional status was brought to the hospital with increasing lethargy after his roommate/brother called EMS.  He reports he has not been eating for over a week.  He was found in the emergency room be hypothermic with blood sugar of 29.  CT of abdomen revealed no ascites, bilateral hydronephrosis and the stents of metastatic disease.  He was evaluated by Dr. Alen Blew who follows him from oncology.  He is noted to have disease progression and now with castration resistant disease.  Unfortunately, he is not a candidate for systemic chemotherapy due to his poor functional status.  Recommendation was for best supportive care with consideration for hospice.  Palliative consulted for goals of care.   Recommendations/Plan:  We discussed that in light of multiple chronic medical problems that have worsened with this acute problem, care should be focused on interventions that are likely to allow the patient to achieve goal of getting back to home and spending time with family. I discussed with family regarding  heroic interventions at the end-of-life and they agree this would not be in line  with prior expressed wishes for a natural death or be likely to lead to getting well enough to go back home. They were in agreement with changing CODE STATUS to DO NOT RESUSCITATE.   Discussed diagnosis and prognosis with patient and his family.  At this point, his desire is to work to transition home with the support of hospice.  We also discussed option for residential hospice as he would be appropriate for inpatient hospice care and his family is concerned about him being able to be cared for in his home environment.  At this time, he is adamant that he only wants to be in his own home.  Discussed with hospice liaison and will work to transition him home with the support of hospice. On discharge, would recommend scripts for: - Morphine Concentrate 28m/0.5ml: 541m(0.2543msublingual every 1 hour as needed for pain or shortness of breath: Disp 20m6mLorazepam 2mg/61mconcentrated solution: 1mg (54mml) s84mingual every 4 hours as needed for anxiety: Disp 20ml - 38mol 2mg/ml s75mtion: 0.5mg (0.2547m subl61mal every 4 hours as needed for agitation or nausea: Disp 20ml   Goal36m Care and Additional Recommendations:  Limitations on Scope of Treatment: Full Comfort Care at the time of discharge from the hospital.  He wants to continue with current interventions in order to set up hospice at home prior to discontinuation of current interventions including blood sugar checks and treatment for hypoglycemia.  He understands it would not be continued on his discharge home with hospice.  Code Status:    Code Status Orders  (From admission, onward)         Start     Ordered   04/19/19 1208  Do not attempt resuscitation (DNR)  Continuous    Question Answer Comment  In the event of cardiac or respiratory ARREST Do not call a "code blue"   In the event of cardiac or respiratory ARREST Do not perform Intubation, CPR,  defibrillation or ACLS   In the event of cardiac or respiratory ARREST Use medication by any route, position, wound care, and other measures to relive pain and suffering. May use oxygen, suction and manual treatment of airway obstruction as needed for comfort.      04/19/19 1207        Code Status History    Date Active Date Inactive Code Status Order ID Comments User Context   04/17/2019 0435 04/19/2019 1207 Full Code [id number]  [id number], Rise Patience2/2019 1928 05/12/2017 1431 Full Code [id number]  [id number] Groutvance Care Planning Activity       Prognosis:   < 2 weeks  Discharge Planning:  Home with Hospice  Care plan was discussed with patient, daughter, and granddaughter, nephew, cousin via phone, bedside care team  Thank you for allowing the Palliative Medicine Team to assist in the care of this patient.   Time In: 1100 Time Out: 1200 Total Time 60 Prolonged Time Billed No      Greater than 50%  of this time was spent counseling and coordinating care related to the above assessment and plan.  Raymon Schlarb FreemanMicheline Rough contact Palliative Medicine Team phone at [phone] [phone] and concerns.

## 2019-04-19 NOTE — TOC Initial Note (Addendum)
Transition of Care [facility]) - Initial/Assessment Note    Patient Details  Name: Anthony Villanueva MRN: [id number] Date of Birth: 04-Mar-1955  Transition of Care [facility] CM/SW Contact:    Anthony Ludwig, LCSW Phone Number: 04/19/2019, 5:02 PM  Clinical Narrative:                  Patient is a 64 year old male who is alert and oriented x4.  Patient smokes Marijuana regularly to help with his appetite and pain.  Patient has prostate cancer, CSW received consult from palliative team.  Patient is requesting to go home with hospice services.  Patient asked palliative to have CSW speak to his daughter as the main contact for hospice at home services.  CSW provided choice of hospice agencies and she did not have a preference.  CSW made referral to [facility], and spoke to Anthony Villanueva, she can accept patient. CSW contacted his daughter Anthony Villanueva, she is in agreement to patient's choice to go home with hospice.  They are requesting a hospital bed, 3 in 1, and a walker.  Patient's daughter did not have any other questions and gave CSW permission to make referral.  Expected Discharge Plan: Home w Hospice Care Barriers to Discharge: Continued Medical Work up, Equipment Delay   Patient Goals and CMS Choice Patient states their goals for this hospitalization and ongoing recovery are:: To return back home with hospice. CMS Medicare.gov Compare Post Acute Care list provided to:: Patient Represenative (must comment) Choice offered to / list presented to : Adult Children  Expected Discharge Plan and Services Expected Discharge Plan: Home w Hospice Care In-house Referral: Clinical Social Work, Hospice / [place] Acute Care Choice: Hospice Living arrangements for the past 2 months: [employer]                 DME Arranged: 3-N-1, Hospital bed, Walker rolling DME Agency: Other - Comment([facility]) Date DME Agency Contacted: 04/19/19 Time DME Agency Contacted: [id number] Representative spoke with  at DME Agency: [place]: ([facility]) [facility] Agency: Other - See comment([facility]) Date [place]: 04/19/19 Time [place]: 38 Representative spoke with at [place]: Bevely Palmer  Prior Living Arrangements/Services Living arrangements for the past 2 months: [place]   Patient language and need for interpreter reviewed:: Yes Do you feel safe going back to the place where you live?: Yes      Need for Family Participation in Patient Care: Yes (Comment) Care giver support system in place?: Yes (comment)   Criminal Activity/Legal Involvement Pertinent to Current Situation/Hospitalization: No - Comment as needed  Activities of Daily Living Home Assistive Devices/Equipment: Eyeglasses ADL Screening (condition at time of admission) Patient's cognitive ability adequate to safely complete daily activities?: Yes Is the patient deaf or have difficulty hearing?: No Does the patient have difficulty seeing, even when wearing glasses/contacts?: No Does the patient have difficulty concentrating, remembering, or making decisions?: No Patient able to express need for assistance with ADLs?: Yes Does the patient have difficulty dressing or bathing?: No Independently performs ADLs?: Yes (appropriate for developmental age) Does the patient have difficulty walking or climbing stairs?: No Weakness of Legs: Both Weakness of Arms/Hands: None  Permission Sought/Granted Permission sought to share information with : Family Supports Permission granted to share information with : Yes, Verbal Permission Granted  Share Information with NAME: Anthony Villanueva    [phone]  Permission granted to share info w AGENCY: [facility]        Emotional Assessment Appearance:: Appears stated age   Affect (typically observed): Accepting, Appropriate, Calm, Stable Orientation: : Oriented to Self, Oriented to Place, Oriented to  Time, Oriented to  Situation Alcohol / Substance Use: Illicit Drugs Psych Involvement: No (comment)  Admission diagnosis:  Hypoglycemia [E16.2] Failure to thrive in adult [R62.7] 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 Hypoglycemia 04/17/2019  . Hypothermia 04/17/2019  . Severe protein-calorie malnutrition ([place]) 04/17/2019  . Vitamin D deficiency 02/22/2019  . Other chronic pain 02/22/2019  . Prostate cancer metastatic to multiple sites ([place]) 11/21/2018  . Blood in stool 10/03/2018  . Chronic diarrhea 09/20/2018  . Essential hypertension 09/20/2018  . History of medication noncompliance 09/20/2018  . Bilateral hip pain 06/26/2018  . Loose stools 06/26/2018  . Prostate cancer ([place]) 05/19/2017  . Pelvic mass 05/10/2017  . Constipation 05/10/2017  . Weight loss, unintentional 05/10/2017  . Hematuria 05/10/2017   PCP:  Azzie Glatter, FNP Pharmacy:   [facility] [place], [place] - [address] AT [address] [place] [address] Phone: [phone] Fax: [phone]  [place], [place] - [place] [address] Phone: [phone] Fax: [phone]     Social Determinants of Health (SDOH) Interventions    Readmission Risk Interventions No flowsheet data found.

## 2019-04-20 DIAGNOSIS — L8990 Pressure ulcer of unspecified site, unspecified stage: Secondary | ICD-10-CM | POA: Diagnosis present

## 2019-04-20 LAB — GLUCOSE, CAPILLARY
Glucose-Capillary: 103 mg/dL — ABNORMAL HIGH (ref 70–99)
Glucose-Capillary: 53 mg/dL — ABNORMAL LOW (ref 70–99)
Glucose-Capillary: 57 mg/dL — ABNORMAL LOW (ref 70–99)
Glucose-Capillary: 64 mg/dL — ABNORMAL LOW (ref 70–99)
Glucose-Capillary: 78 mg/dL (ref 70–99)
Glucose-Capillary: 83 mg/dL (ref 70–99)
Glucose-Capillary: 85 mg/dL (ref 70–99)

## 2019-04-20 MED ORDER — LORAZEPAM 2 MG/ML IJ SOLN
1.0000 mg | Freq: Once | INTRAMUSCULAR | Status: AC
  Administered 2019-04-20: 1 mg via INTRAVENOUS
  Filled 2019-04-20: qty 1

## 2019-04-20 MED ORDER — DEXTROSE 50 % IV SOLN
25.0000 g | INTRAVENOUS | Status: AC
  Administered 2019-04-20: 25 g via INTRAVENOUS
  Filled 2019-04-20: qty 50

## 2019-04-20 MED ORDER — DEXTROSE 50 % IV SOLN
12.5000 g | INTRAVENOUS | Status: AC
  Administered 2019-04-20: 12.5 g via INTRAVENOUS

## 2019-04-20 NOTE — Discharge Summary (Signed)
Physician Discharge Summary  Anthony Villanueva [id number] DOB: October 21, 1955 DOA: 04/16/2019  PCP: Azzie Glatter, FNP  Admit date: 04/16/2019 Discharge date: 04/20/2019  Admitted From: Home Disposition: Inpatient hospice   Discharge Condition: Fair CODE STATUS: DNR Diet recommendation: Regular diet  Discharge summary: 64 year old gentleman with history of metastatic prostate cancer , anemia, debility, poor functional status brought to the hospital with severe debility, lethargy, patient's roommate called EMS. Patient told that he has not been eating for more than a week. In the emergency room, he was hypothermic with temperature 93.7, blood sugars 29. Other labs were remarkable. CT scan of the abdomen pelvis showed new ascites, bilateral hydronephrosis, extensive metastatic disease. Patient stated that he is mostly in the bed, cannot walk, has not been eating and only smoking marijuana for more than 10 days.  Metastatic prostate cancer with failure to thrive,  severe physical debility and functional debility,  severe protein calorie malnutrition,  hypothermia and hypoglycemia Marijuana use  Plan: Patient with cancer induced anorexia, poor appetite, hypoglycemia and hypothermia.  Extensive metastatic prostate cancer not amenable to treatment or palliative chemotherapy due to severe debility. Terminal cancer. Blood glucose only maintained with continuous dextrose infusion. Patient on terminal stage of cancer with no adequate support at home. He is needing additional care and end-of-life care. All symptom control medications available.  He will be medicated before transfer. Will be transferred to inpatient hospice today. DNR.  Discharge Diagnoses:  Active Problems:   Essential hypertension   Prostate cancer metastatic to multiple sites [facility])   Hypoglycemia   Hypothermia   Severe protein-calorie malnutrition ([place])   Pressure injury of skin    Discharge  Instructions  Discharge Instructions    Diet general   Complete by: As directed      Allergies as of 04/20/2019   No Known Allergies     Medication List    STOP taking these medications   abiraterone acetate 250 MG tablet Commonly known as: ZYTIGA   buprenorphine 5 MCG/HR Ptwk Commonly known as: BUTRANS   hydrochlorothiazide 25 MG tablet Commonly known as: HYDRODIURIL   loperamide 2 MG tablet Commonly known as: Imodium A-D   Vitamin D (Ergocalciferol) 1.25 MG (50000 UNIT) Caps capsule Commonly known as: DRISDOL     TAKE these medications   Acetaminophen 500 MG coapsule Take 500 mg by mouth 3 (three) times daily as needed for pain.   gabapentin 600 MG tablet Commonly known as: NEURONTIN Take 600 mg by mouth 3 (three) times daily.   Oxycodone HCl 20 MG Tabs Take 1 tablet by mouth 4 (four) times daily as needed for pain.       No Known Allergies  Consultations:  Oncology  Palliative medicine   Procedures/Studies: CT ABDOMEN PELVIS W CONTRAST  Result Date: 04/17/2019 CLINICAL DATA:  Pancreatitis. History of prostate cancer with bone Mets. EXAM: CT ABDOMEN AND PELVIS WITH CONTRAST TECHNIQUE: Multidetector CT imaging of the abdomen and pelvis was performed using the standard protocol following bolus administration of intravenous contrast. CONTRAST:  73mL OMNIPAQUE IOHEXOL 300 MG/ML  SOLN COMPARISON:  A 03/09/2018 FINDINGS: Lower chest: There is a small left-sided pleural effusion.The heart size is normal. Hepatobiliary: The liver is normal. Normal gallbladder.There is no biliary ductal dilation. Pancreas: Normal contours without ductal dilatation. No peripancreatic fluid collection. Spleen: There is a new heterogeneous mass in the spleen measuring approximately 2 cm. This is concerning for a new metastatic lesion. Adrenals/Urinary Tract: --Adrenal glands: There appears to be a mass in the  left adrenal gland measuring approximately 1.6 cm. --Right kidney/ureter: There  is mild to moderate right-sided hydroureteronephrosis. --Left kidney/ureter: There is moderate severe left-sided hydronephrosis. There appears to be extensive soft tissue involving the mid to distal left ureter. --Urinary bladder: There is diffuse bladder wall thickening. There is probable invasion of the urinary bladder from the patient's rectal mass. Stomach/Bowel: --Stomach/Duodenum: No hiatal hernia or other gastric abnormality. Normal duodenal course and caliber. --Small bowel: No dilatation or inflammation. --Colon: There is diffuse wall thickening of the rectum and sigmoid colon. There are findings suspicious for an underlying rectal mass. --Appendix: Normal. Vascular/Lymphatic: Normal course and caliber of the major abdominal vessels. --pathologically enlarged retroperitoneal lymph nodes are noted. The --No mesenteric lymphadenopathy. --pathologically enlarged pelvic lymph nodes are noted. Reproductive: Unremarkable Other: There is a virtual complete lack of intra-abdominal fat. There is likely a moderate to large volume of abdominal ascites, however evaluation is limited by lack of abdominal fat. There is no free air. There is a cystic mass in the patient's mid abdomen measuring approximately 7 x 3.5 cm, favored to represent an area of loculated ascites. Other differential considerations include a large necrotic lymph node. Musculoskeletal. Extensive mixed sclerotic and lytic metastatic lesions are noted throughout lumbar spine and pelvis. For example, there is progressive destruction of the L4 vertebral body. These have progressed since the prior study. There is no likely spinal canal stenosis at the L3-L4 and L4-L5 levels secondary to metastatic disease. IMPRESSION: 1. Overall findings consistent with worsening metastatic disease as detailed above. 2. New bilateral hydronephrosis, left worse than right. On the left, the hydronephrosis appears to be secondary to soft tissue involvement of the left ureter.  On the right, the hydronephrosis may be secondary to retroperitoneal adenopathy or the patient's infiltrative bladder mass. 3. Persistent diffuse wall thickening of the rectum, improved from prior study. There is probable invasion of the posterior bladder wall as before. 4. Probable new intra-abdominal ascites as detailed above. Electronically Signed   By: Constance Holster M.D.   On: 04/17/2019 02:05     Subjective: Patient was seen and examined.  Overnight blood sugars remain low even being on dextrose.  He was happy to be able to eat 2 pancakes today. Patient intermittently has rectal bleeding and drainage from his rectum which is probably from infiltrated prostate cancer into the rectum. He initially decided to go home, however today he thought it is better for him to go to inpatient hospice.    Discharge Exam: Vitals:   04/19/19 2202 04/20/19 0649  BP: 106/63 115/77  Pulse: 96 97  Resp: 14 17  Temp: 99.3 F (37.4 C) 97.9 F (36.6 C)  SpO2:  100%   Vitals:   04/19/19 1229 04/19/19 1637 04/19/19 2202 04/20/19 0649  BP: 103/62 117/70 106/63 115/77  Pulse: (!) 108 (!) 102 96 97  Resp: 16 16 14 17   Temp: 97.6 F (36.4 C) 97.6 F (36.4 C) 99.3 F (37.4 C) 97.9 F (36.6 C)  TempSrc: Oral Oral Oral Oral  SpO2: 94% 94%  100%  Weight:      Height:        General: Pt is alert, awake, not in acute distress, chronically sick looking, cachectic. Cardiovascular: RRR, S1/S2 +, no rubs, no gallops Respiratory: CTA bilaterally, no wheezing, no rhonchi Abdominal: Soft, NT, ND, bowel sounds + Extremities: no edema, no cyanosis Blood mixed drainage from his rectum present.    The results of significant diagnostics from this hospitalization (including imaging, microbiology,  ancillary and laboratory) are listed below for reference.     Microbiology: Recent Results (from the past 240 hour(s))  Urine culture     Status: Abnormal   Collection Time: 04/17/19  1:18 AM   Specimen:  Urine, Random  Result Value Ref Range Status   Specimen Description URINE, RANDOM  Final   Special Requests   Final    NONE Performed at [facility] [address], [place], [place] [address]    Culture (A)  Final    <10,000 COLONIES/mL INSIGNIFICANT GROWTH Performed at [facility] [address] [place], [place] [address]    Report Status 04/17/2019 FINAL  Final  SARS CORONAVIRUS 2 (TAT 6-24 HRS) Nasopharyngeal Nasopharyngeal Swab     Status: None   Collection Time: 04/17/19  1:32 AM   Specimen: Nasopharyngeal Swab  Result Value Ref Range Status   SARS Coronavirus 2 NEGATIVE NEGATIVE Final    Comment: (NOTE) SARS-CoV-2 target nucleic acids are NOT DETECTED. The SARS-CoV-2 RNA is generally detectable in upper and lower respiratory specimens during the acute phase of infection. Negative results do not preclude SARS-CoV-2 infection, do not rule out co-infections with other pathogens, and should not be used as the sole basis for treatment or other patient management decisions. Negative results must be combined with clinical observations, patient history, and epidemiological information. The expected result is Negative. Fact Sheet for Patients: SugarRoll.be Fact Sheet for Healthcare Providers: https://www.woods-mathews.com/ This test is not yet approved or cleared by the [place] FDA and  has been authorized for detection and/or diagnosis of SARS-CoV-2 by FDA under an Emergency Use Authorization (EUA). This EUA will remain  in effect (meaning this test can be used) for the duration of the COVID-19 declaration under Section 56 4(b)(1) of the Act, 21 U.S.C. section 360bbb-3(b)(1), unless the authorization is terminated or revoked sooner. Performed at [facility], [place] [address], [place], [place] [address]      Labs: BNP (last 3 results) No results for input(s): BNP in the last 8760 hours. Basic  Metabolic Panel: Recent Labs  Lab 04/16/19 2349 04/17/19 0508  NA 128* 128*  K 3.0* 3.6  CL 99 102  CO2 19* 17*  GLUCOSE 89 154*  BUN 64* 60*  CREATININE 1.21 1.15  CALCIUM 7.2* 7.0*   Liver Function Tests: Recent Labs  Lab 04/16/19 2349 04/17/19 0508  AST 105* 66*  ALT 50* 41  ALKPHOS 73 64  BILITOT 0.7 0.6  PROT 5.1* 4.4*  ALBUMIN 2.2* 1.8*   Recent Labs  Lab 04/16/19 2349  LIPASE 145*   No results for input(s): AMMONIA in the last 168 hours. CBC: Recent Labs  Lab 04/16/19 2349 04/17/19 0508  WBC 4.8 5.5  NEUTROABS 4.0 4.8  HGB 8.3* 7.8*  HCT 25.0* 23.6*  MCV 87.4 86.8  PLT 276 239   Cardiac Enzymes: No results for input(s): CKTOTAL, CKMB, CKMBINDEX, TROPONINI in the last 168 hours. BNP: Invalid input(s): POCBNP CBG: Recent Labs  Lab 04/20/19 0645 04/20/19 0743 04/20/19 0857 04/20/19 0952 04/20/19 1155  GLUCAP 53* 57* 64* 78 85   D-Dimer No results for input(s): DDIMER in the last 72 hours. Hgb A1c No results for input(s): HGBA1C in the last 72 hours. Lipid Profile No results for input(s): CHOL, HDL, LDLCALC, TRIG, CHOLHDL, LDLDIRECT in the last 72 hours. Thyroid function studies No results for input(s): TSH, T4TOTAL, T3FREE, THYROIDAB in the last 72 hours.  Invalid input(s): FREET3 Anemia work up No  results for input(s): VITAMINB12, FOLATE, FERRITIN, TIBC, IRON, RETICCTPCT in the last 72 hours. Urinalysis    Component Value Date/Time   COLORURINE YELLOW 04/17/2019 0118   APPEARANCEUR CLEAR 04/17/2019 0118   LABSPEC 1.014 04/17/2019 0118   PHURINE 5.0 04/17/2019 0118   GLUCOSEU NEGATIVE 04/17/2019 0118   HGBUR MODERATE (A) 04/17/2019 0118   BILIRUBINUR NEGATIVE 04/17/2019 0118   BILIRUBINUR Negative 02/20/2019 1411   KETONESUR NEGATIVE 04/17/2019 0118   PROTEINUR NEGATIVE 04/17/2019 0118   UROBILINOGEN 0.2 02/20/2019 1411   NITRITE NEGATIVE 04/17/2019 0118   LEUKOCYTESUR NEGATIVE 04/17/2019 0118   Sepsis Labs Invalid input(s):  PROCALCITONIN,  WBC,  LACTICIDVEN Microbiology Recent Results (from the past 240 hour(s))  Urine culture     Status: Abnormal   Collection Time: 04/17/19  1:18 AM   Specimen: Urine, Random  Result Value Ref Range Status   Specimen Description URINE, RANDOM  Final   Special Requests   Final    NONE Performed at [facility], [place] [address], [place], [place] [address]    Culture (A)  Final    <10,000 COLONIES/mL INSIGNIFICANT GROWTH Performed at [facility] [address], [place], [place] [address]    Report Status 04/17/2019 FINAL  Final  SARS CORONAVIRUS 2 (TAT 6-24 HRS) Nasopharyngeal Nasopharyngeal Swab     Status: None   Collection Time: 04/17/19  1:32 AM   Specimen: Nasopharyngeal Swab  Result Value Ref Range Status   SARS Coronavirus 2 NEGATIVE NEGATIVE Final    Comment: (NOTE) SARS-CoV-2 target nucleic acids are NOT DETECTED. The SARS-CoV-2 RNA is generally detectable in upper and lower respiratory specimens during the acute phase of infection. Negative results do not preclude SARS-CoV-2 infection, do not rule out co-infections with other pathogens, and should not be used as the sole basis for treatment or other patient management decisions. Negative results must be combined with clinical observations, patient history, and epidemiological information. The expected result is Negative. Fact Sheet for Patients: SugarRoll.be Fact Sheet for Healthcare Providers: https://www.woods-mathews.com/ This test is not yet approved or cleared by the [place] FDA and  has been authorized for detection and/or diagnosis of SARS-CoV-2 by FDA under an Emergency Use Authorization (EUA). This EUA will remain  in effect (meaning this test can be used) for the duration of the COVID-19 declaration under Section 56 4(b)(1) of the Act, 21 U.S.C. section 360bbb-3(b)(1), unless the authorization is terminated or revoked  sooner. Performed at [facility], [place] [address], [place], [place] [address]      Time coordinating discharge:  45 minutes  SIGNED:   Barb Merino, MD  [facility] 04/20/2019, 1:21 PM

## 2019-04-20 NOTE — TOC Progression Note (Signed)
Transition of Care [facility]) - Progression Note    Patient Details  Name: Arrion Nevills MRN: [id number] Date of Birth: 1955/11/29  [facility] CM/SW Contact  Ross Ludwig, [place] Phone Number: 04/20/2019, 12:35 PM  Clinical Narrative:     Palliative spoke with patient and his nephew, patient has decided to go to hospice facility.  CSW spoke to patient's daughter per his request, and provided bed choice, she chose [facility].  CSW spoke Bradd Canary at [facility], and she said a bed is available today.  She will contact patient's daughter to make arrangements for paperwork to be completed.  CSW updated attending physician, CSW to facilitate discharge planning.   Expected Discharge Plan: Home w Hospice Care Barriers to Discharge: Continued Medical Work up, Equipment Delay  Expected Discharge Plan and Services Expected Discharge Plan: [place] In-house Referral: Clinical Social Work, Hospice / [place] Acute Care Choice: Hospice Living arrangements for the past 2 months: Single Family Home                 DME Arranged: 3-N-1, Hospital bed, [place] rolling DME Agency: Other - Comment([facility]) Date DME Agency Contacted: 04/19/19 Time DME Agency Contacted: [phone] Representative spoke with at DME Agency: [place]: ([place] services) [place]: Other - See comment([facility]) Date [place]: 04/19/19 Time [place]: 1530 Representative spoke with at [place]: [place] ([place]) Interventions    Readmission Risk Interventions No flowsheet data found.

## 2019-04-20 NOTE — Progress Notes (Signed)
Daily Progress Note   Patient Name: Anthony Villanueva       Date: 04/20/2019 DOB: [birth year]  Age: 64 y.o. MRN#: [id number] Attending Physician: Barb Merino, MD Primary Care Physician: Azzie Glatter, FNP Admit Date: 04/16/2019  Reason for Consultation/Follow-up: Establishing goals of care  Subjective: I met today with Anthony Villanueva in conjunction with his nephew.  Initially, his goal had been to transition home with hospice support.  He has been having some increased symptom burden with diarrhea and now feels that he would be better served by transition to residential hospice.    His family has been concerned about plan to transition home with hospice and have been talking to him about residential hospice.  Length of Stay: 3  Current Medications: Scheduled Meds:  . feeding supplement (PRO-STAT SUGAR FREE 64)  30 mL Oral TID BM  . megestrol  400 mg Oral BID  . multivitamin with minerals  1 tablet Oral Daily  . OLANZapine  5 mg Oral QHS    Continuous Infusions: . dextrose 5% lactated ringers 50 mL/hr at 04/20/19 0156    PRN Meds: acetaminophen **OR** acetaminophen, alum & mag hydroxide-simeth, dextrose, fentaNYL (SUBLIMAZE) injection, morphine injection, ondansetron **OR** ondansetron (ZOFRAN) IV, oxyCODONE  Physical Exam         General: Alert, awake, in no acute distress. Frail and chronically ill-appearing.  Cachectic. HEENT: No bruits, no goiter, no JVD Heart: Tachycardic Lungs: Good air movement, clear Abdomen: Soft, nontender, nondistended, positive bowel sounds.  Ext: No significant edema.  Significant muscle wasting Skin: Warm and dry Neuro: Grossly intact, nonfocal.   Vital Signs: BP 115/77 (BP Location: Left Arm)   Pulse 97   Temp 97.9 F (36.6 C) (Oral)   Resp  17   Ht 6' 1"  (1.854 m)   Wt 54 kg   SpO2 100%   BMI 15.70 kg/m  SpO2: SpO2: 100 % O2 Device: O2 Device: Room Air O2 Flow Rate: O2 Flow Rate (L/min): 2 L/min  Intake/output summary:   Intake/Output Summary (Last 24 hours) at 04/20/2019 1132 Last data filed at 04/20/2019 0604 Gross per 24 hour  Intake 1215.58 ml  Output 300 ml  Net 915.58 ml   LBM: Last BM Date: 04/19/19 Baseline Weight: Weight: 54 kg Most recent weight: Weight: 54 kg  Palliative Assessment/Data:    Flowsheet Rows     Most Recent Value  Intake Tab  Referral Department  Hospitalist  Unit at Time of Referral  Med/Surg Unit  Palliative Care Primary Diagnosis  Cancer  Date Notified  04/17/19  Palliative Care Type  New Palliative care  Reason for referral  Clarify Goals of Care  Date of Admission  04/17/19  Date first seen by Palliative Care  04/18/19  # of days Palliative referral response time  1 Day(s)  # of days IP prior to Palliative referral  0  Clinical Assessment  Palliative Performance Scale Score  40%  Psychosocial & Spiritual Assessment  Palliative Care Outcomes  Patient/Family meeting held?  Yes  Who was at the meeting?  patient      Patient Active Problem List   Diagnosis Date Noted  . Pressure injury of skin 04/20/2019  . Hypoglycemia 04/17/2019  . Hypothermia 04/17/2019  . Severe protein-calorie malnutrition ([place]) 04/17/2019  . Vitamin D deficiency 02/22/2019  . Other chronic pain 02/22/2019  . Prostate cancer metastatic to multiple sites ([place]) 11/21/2018  . Blood in stool 10/03/2018  . Chronic diarrhea 09/20/2018  . Essential hypertension 09/20/2018  . History of medication noncompliance 09/20/2018  . Bilateral hip pain 06/26/2018  . Loose stools 06/26/2018  . Prostate cancer ([place]) 05/19/2017  . Pelvic mass 05/10/2017  . Constipation 05/10/2017  . Weight loss, unintentional 05/10/2017  . Hematuria 05/10/2017    Palliative Care Assessment & Plan   Patient  Profile: Anthony Villanueva is a 64 year old male with past medical history of prostate cancer, anemia, debility with poor functional status was brought to the hospital with increasing lethargy after his roommate/brother called EMS.  He reports he has not been eating for over a week.  He was found in the emergency room be hypothermic with blood sugar of 29.  CT of abdomen revealed no ascites, bilateral hydronephrosis and the stents of metastatic disease.  He was evaluated by Dr. Alen Blew who follows him from oncology.  He is noted to have disease progression and now with castration resistant disease.  Unfortunately, he is not a candidate for systemic chemotherapy due to his poor functional status.  Recommendation was for best supportive care with consideration for hospice.  Palliative consulted for goals of care.   Recommendations/Plan: Anthony Villanueva would like to transition to residential hospice for endo of life care.  He has persistent hypoglycemia (being corrected in the hospital while working on option to be out of the hospital) and his prognosis with this continuing is likely days at best.  He has increasing symptom burden of pain and worsening diarrhea.  He would be best served by transition to residential hospice for end of life care if it can be arranged.    Goals of Care and Additional Recommendations:  Limitations on Scope of Treatment: Full Comfort Care at the time of discharge from the hospital.  He wants to continue with current interventions in order to set up hospice at home prior to discontinuation of current interventions including blood sugar checks and treatment for hypoglycemia.  He understands it would not be continued on his discharge home with hospice.  Code Status:    Code Status Orders  (From admission, onward)         Start     Ordered   04/19/19 1208  Do not attempt resuscitation (DNR)  Continuous    Question Answer Comment  In the event of cardiac or respiratory ARREST Do not  call  a "code blue"   In the event of cardiac or respiratory ARREST Do not perform Intubation, CPR, defibrillation or ACLS   In the event of cardiac or respiratory ARREST Use medication by any route, position, wound care, and other measures to relive pain and suffering. May use oxygen, suction and manual treatment of airway obstruction as needed for comfort.      04/19/19 1207        Code Status History    Date Active Date Inactive Code Status Order ID Comments User Context   04/17/2019 0435 04/19/2019 1207 Full Code [id number]  Rise Patience, MD ED   05/10/2017 1928 05/12/2017 1431 Full Code [id number]  Phillips Grout, MD ED   Advance Care Planning Activity       Prognosis:   < 2 weeks  Discharge Planning:  Hospice facility  Care plan was discussed with patient, nephew, and bedside care team  Thank you for allowing the Palliative Medicine Team to assist in the care of this patient.   Time In: 1010 Time Out: 1030 Total Time 20 Prolonged Time Billed No      Greater than 50%  of this time was spent counseling and coordinating care related to the above assessment and plan.  Micheline Rough, MD  Please contact Palliative Medicine Team phone at [phone] for questions and concerns.

## 2019-04-20 NOTE — Progress Notes (Signed)
Report called to hospice RN at [facility].  All questions answered.  Family aware of discharge.

## 2019-04-20 NOTE — Progress Notes (Signed)
Hypoglycemic Event  CBG: 64  Treatment: D50 25 mL (12.5 gm)  Symptoms: None  Follow-up CBG: [id number] CBG Result:75  Possible Reasons for Event: Inadequate meal intake  Comments/MD notified:Notified Dr. Herb Grays, Arna Medici

## 2019-04-20 NOTE — TOC Transition Note (Addendum)
Transition of Care [facility]) - CM/SW Discharge Note   Patient Details  Name: Anthony Villanueva MRN: [id number] Date of Birth: 05-20-1955  Transition of [facility] CM/SW Contact:  Ross Ludwig, LCSW Phone Number: 04/20/2019, 5:28 PM   Clinical Narrative:     Patient to be d/c'ed today to [facility]. Patient and family agreeable to plans will transport via ems RN to call report [phone].  Patient's daughter is aware of discharge to hospice facility.     Final next level of care: [place] Barriers to Discharge: Barriers Resolved   Patient Goals and CMS Choice Patient states their goals for this hospitalization and ongoing recovery are:: To go to [facility] for end of life care. CMS Medicare.gov Compare Post Acute Care list provided to:: Patient Represenative (must comment) Choice offered to / list presented to : Adult Children  Discharge Placement              Patient chooses bed at: Other - please specify in the comment section below:([facility]) Patient to be transferred to facility by: [facility] Name of family member notified: Daugher Milderd Meager [phone] Patient and family notified of of transfer: 04/20/19  Discharge Plan and Services In-house Referral: Clinical Social Work, Hospice / [place] Acute Care Choice: Hospice          DME Arranged: 3-N-1, Hospital bed, [place] rolling DME Agency: Other - Comment([facility]) Date DME Agency Contacted: 04/19/19 Time DME Agency Contacted: [id number] Representative spoke with at DME Agency: [place]: ([place] [facility]) [facility] Agency: Other - See comment([facility]) Date [place]: 04/19/19 Time [place]: [place] Representative spoke with at [place]: [place] ([place]) Interventions     Readmission Risk Interventions No flowsheet data found.

## 2019-04-20 NOTE — Progress Notes (Signed)
[facility] Documentation  °   °Pt has been approved for [facility] transfer. [facility] does have a bed available for pt today. Paperwork has been completed and transportation can be arranged.   °   °Please call [facility] at [phone] to give charge nurse report and fax discharge summary to [phone].  °   °Please dc any lines. May leave catheter in place if pt has one. Please send pt to [facility] with DNR paperwork.   °   °Please call with any questions.   °   °Thank you,   °Jennifer Love, RN  ° °

## 2019-04-20 NOTE — Progress Notes (Signed)
CRITICAL VALUE ALERT  Critical Value: CBG 53  Date & Time Notied:04/20/19;0701  Provider Notified: YES  Orders Received/Actions taken: STANDING ORDERS FOR HYPOGLYCEMIA   PCP was notified

## 2019-04-23 LAB — GLUCOSE, CAPILLARY: Glucose-Capillary: 40 mg/dL — CL (ref 70–99)

## 2019-05-10 DEATH — deceased

## 2019-07-20 ENCOUNTER — Ambulatory Visit: Payer: Self-pay | Admitting: Family Medicine

## 2019-08-23 ENCOUNTER — Telehealth: Payer: Self-pay | Admitting: *Deleted

## 2019-08-23 NOTE — Telephone Encounter (Signed)
No answer/busy

## 2019-09-25 ENCOUNTER — Encounter: Payer: Self-pay | Admitting: Cardiology

## 2021-04-05 IMAGING — CR LUMBAR SPINE - COMPLETE 4+ VIEW
5 series · 5 of 5 positions shown · non-contrast
Comparison: None.

CLINICAL DATA: Low back and right proximal leg pain for the past 2
days. No known injury.

EXAM:
LUMBAR SPINE - COMPLETE 4+ VIEW

[t lumbar spine ap]
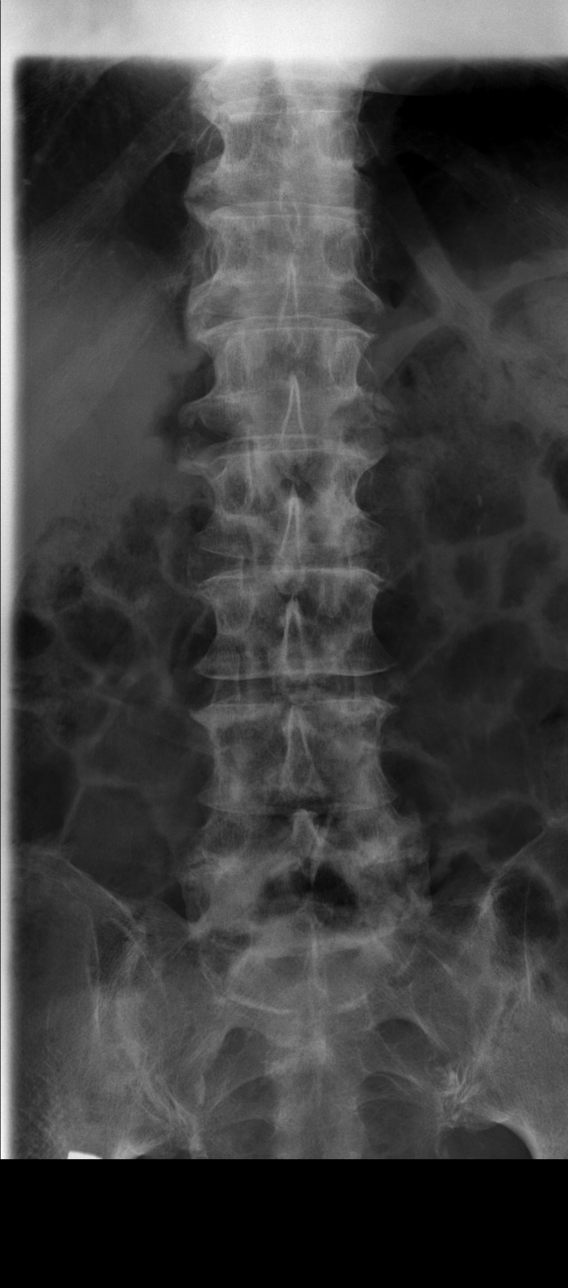

[t lumbar spine obl (1 of 2)]
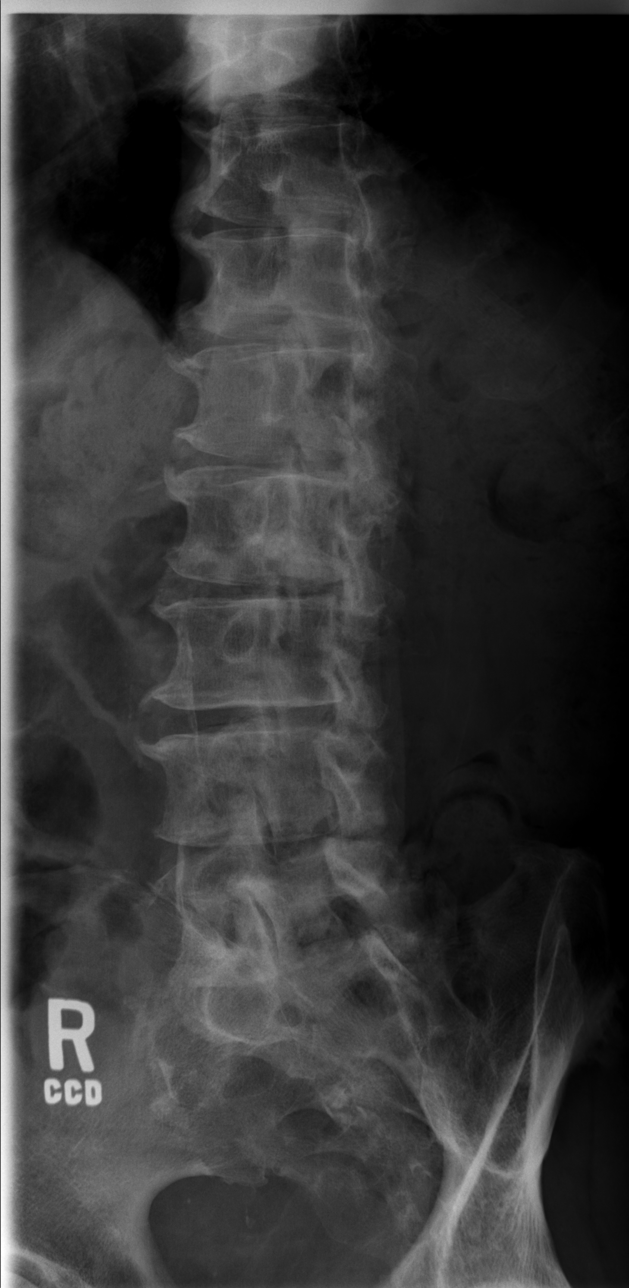

[t lumbar spine obl (2 of 2)]
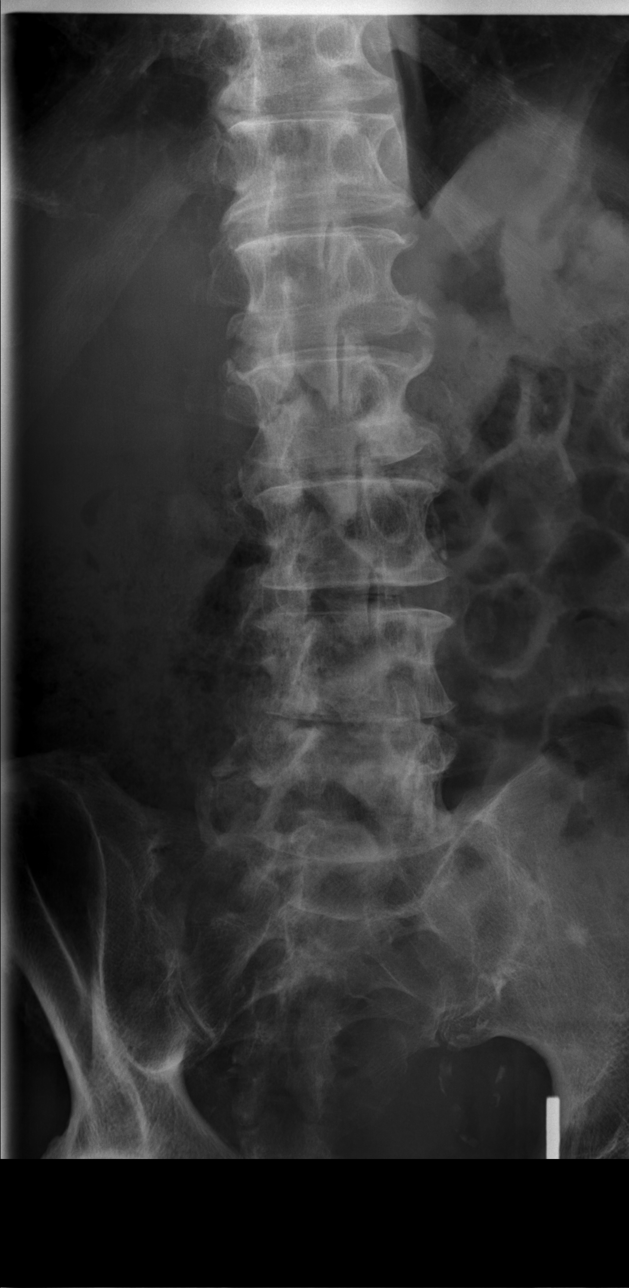

[t lumbar spine lat]
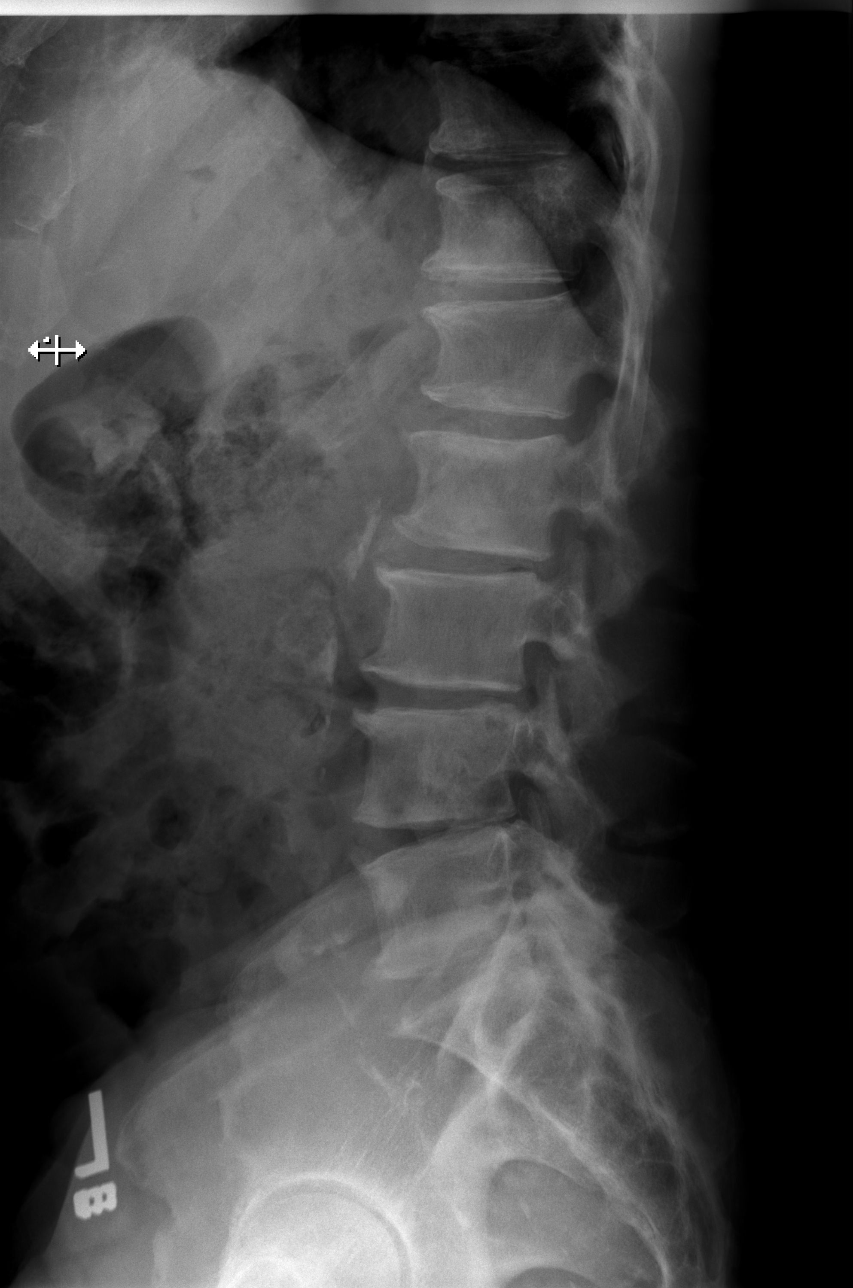

[t lumbar l-5 s-1 spot]
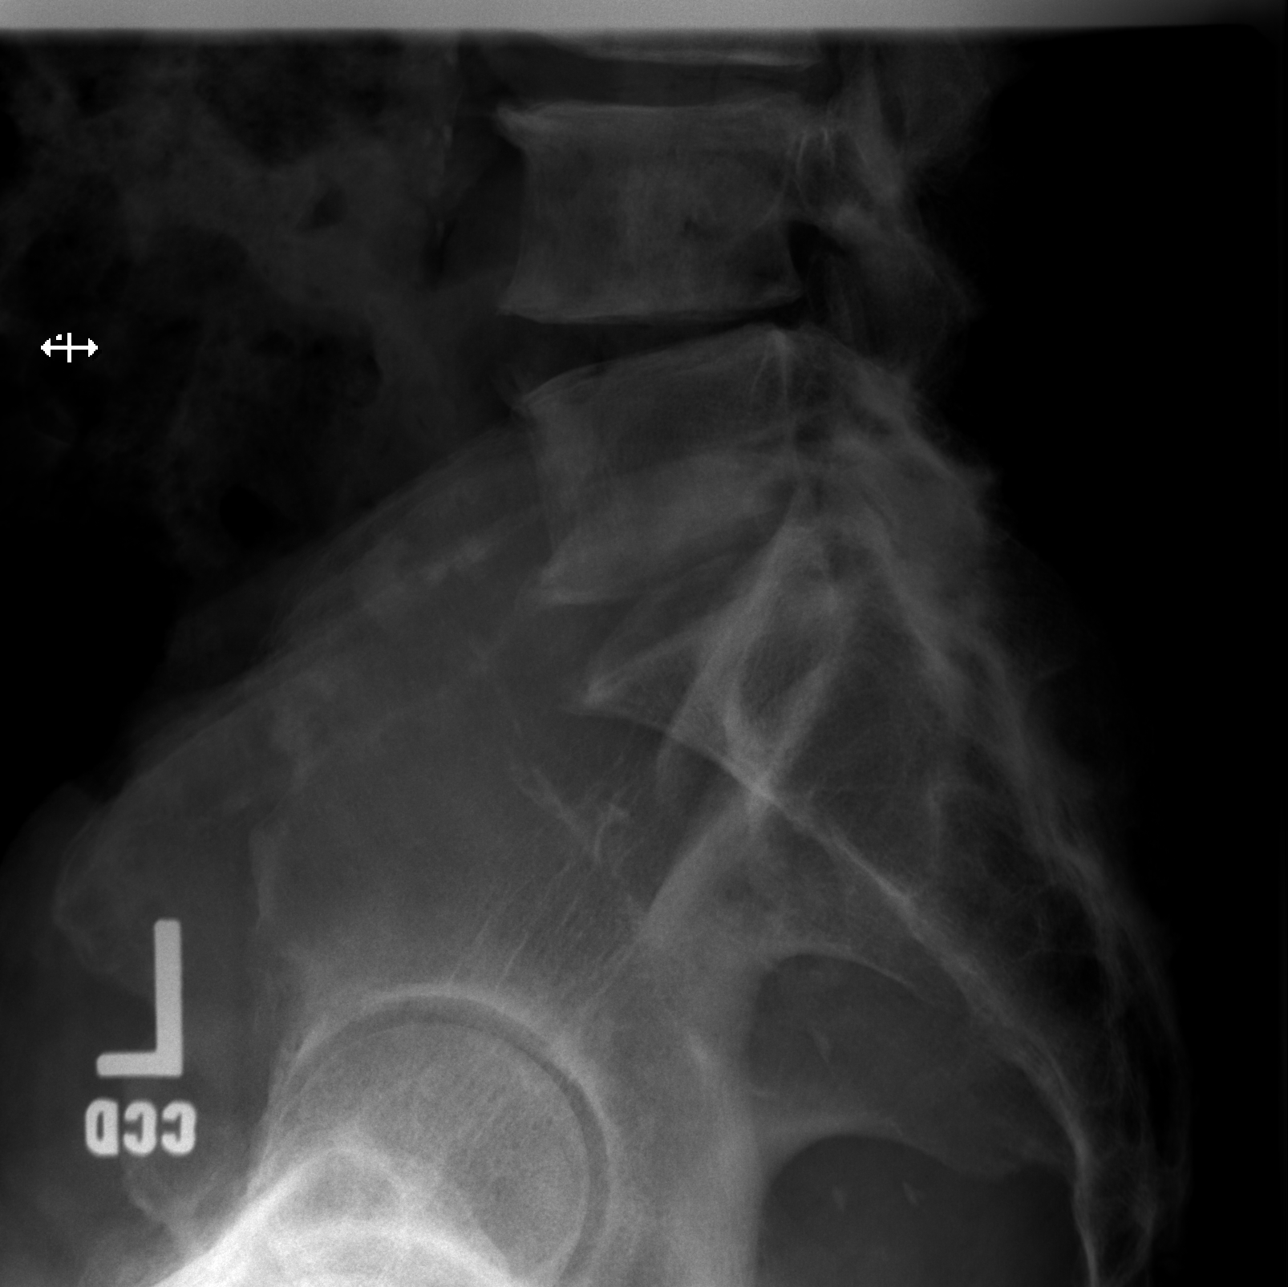

[5 of 5 positions shown; findings below may reference images not displayed]

FINDINGS: Five non-rib-bearing lumbar vertebrae. Mild to moderate anterior and
lateral spur formation throughout the lumbar and lower thoracic
spine. Facet degenerative changes in the lower lumbar spine. No
fractures, pars defects or subluxations. Atheromatous arterial
calcifications.
IMPRESSION: Multilevel degenerative changes. No acute abnormality.
